# Patient Record
Sex: Female | Born: 1985 | Race: Black or African American | Hispanic: No | Marital: Single | Smoking: Current every day smoker
Health system: Southern US, Community
[De-identification: ages and names within clinical notes are randomized; demographics above are authoritative.]

## PROBLEM LIST (undated history)

## (undated) DIAGNOSIS — L732 Hidradenitis suppurativa: Secondary | ICD-10-CM

## (undated) HISTORY — DX: Hidradenitis suppurativa: L73.2

---

## 2006-10-07 ENCOUNTER — Emergency Department (HOSPITAL_COMMUNITY): Admission: EM | Admit: 2006-10-07 | Discharge: 2006-10-07 | Payer: Self-pay | Admitting: Emergency Medicine

## 2008-04-23 ENCOUNTER — Emergency Department (HOSPITAL_COMMUNITY): Admission: EM | Admit: 2008-04-23 | Discharge: 2008-04-23 | Payer: Self-pay | Admitting: Emergency Medicine

## 2008-10-22 ENCOUNTER — Inpatient Hospital Stay (HOSPITAL_COMMUNITY): Admission: AD | Admit: 2008-10-22 | Discharge: 2008-10-22 | Payer: Self-pay | Admitting: Obstetrics & Gynecology

## 2008-10-22 ENCOUNTER — Ambulatory Visit: Payer: Self-pay | Admitting: Obstetrics and Gynecology

## 2010-09-05 LAB — WET PREP, GENITAL
Clue Cells Wet Prep HPF POC: NONE SEEN
Yeast Wet Prep HPF POC: NONE SEEN

## 2010-09-05 LAB — CBC
HCT: 32.6 % — ABNORMAL LOW (ref 36.0–46.0)
MCHC: 32.5 g/dL (ref 30.0–36.0)
MCV: 78.2 fL (ref 78.0–100.0)
RBC: 4.17 MIL/uL (ref 3.87–5.11)

## 2010-09-05 LAB — HCG, QUANTITATIVE, PREGNANCY: hCG, Beta Chain, Quant, S: <2 m[IU]/mL (ref <5)

## 2010-09-05 LAB — GC/CHLAMYDIA PROBE AMP, GENITAL: Chlamydia, DNA Probe: NEGATIVE

## 2011-05-17 HISTORY — PX: ROOT CANAL: SHX2363

## 2011-05-21 ENCOUNTER — Encounter (HOSPITAL_COMMUNITY): Payer: Self-pay | Admitting: *Deleted

## 2011-05-21 ENCOUNTER — Emergency Department (HOSPITAL_COMMUNITY): Payer: 59

## 2011-05-21 ENCOUNTER — Encounter: Payer: Self-pay | Admitting: Emergency Medicine

## 2011-05-21 ENCOUNTER — Emergency Department (HOSPITAL_COMMUNITY)
Admission: EM | Admit: 2011-05-21 | Discharge: 2011-05-21 | Disposition: A | Discharge status: Home / Self Care | Payer: 59 | Attending: Emergency Medicine | Admitting: Emergency Medicine

## 2011-05-21 ENCOUNTER — Emergency Department (INDEPENDENT_AMBULATORY_CARE_PROVIDER_SITE_OTHER)
Admission: EM | Admit: 2011-05-21 | Discharge: 2011-05-21 | Disposition: A | Discharge status: Nursing Home (Includes ICF) | Payer: 59 | Source: Home / Self Care

## 2011-05-21 DIAGNOSIS — K044 Acute apical periodontitis of pulpal origin: Secondary | ICD-10-CM

## 2011-05-21 DIAGNOSIS — K047 Periapical abscess without sinus: Secondary | ICD-10-CM

## 2011-05-21 DIAGNOSIS — R509 Fever, unspecified: Secondary | ICD-10-CM | POA: Insufficient documentation

## 2011-05-21 DIAGNOSIS — K089 Disorder of teeth and supporting structures, unspecified: Secondary | ICD-10-CM | POA: Insufficient documentation

## 2011-05-21 DIAGNOSIS — Z79899 Other long term (current) drug therapy: Secondary | ICD-10-CM | POA: Insufficient documentation

## 2011-05-21 DIAGNOSIS — R220 Localized swelling, mass and lump, head: Secondary | ICD-10-CM | POA: Insufficient documentation

## 2011-05-21 DIAGNOSIS — R221 Localized swelling, mass and lump, neck: Secondary | ICD-10-CM | POA: Insufficient documentation

## 2011-05-21 DIAGNOSIS — K0889 Other specified disorders of teeth and supporting structures: Secondary | ICD-10-CM

## 2011-05-21 DIAGNOSIS — E119 Type 2 diabetes mellitus without complications: Secondary | ICD-10-CM | POA: Insufficient documentation

## 2011-05-21 LAB — CBC
HCT: 35 % — ABNORMAL LOW (ref 36.0–46.0)
MCHC: 32.6 g/dL (ref 30.0–36.0)
Platelets: 252 10*3/uL (ref 150–400)
RDW: 14.3 % (ref 11.5–15.5)
WBC: 9.9 10*3/uL (ref 4.0–10.5)

## 2011-05-21 LAB — DIFFERENTIAL
Basophils Absolute: 0 10*3/uL (ref 0.0–0.1)
Basophils Relative: 0 % (ref 0–1)
Lymphocytes Relative: 16 % (ref 12–46)
Monocytes Absolute: 0.5 10*3/uL (ref 0.1–1.0)
Neutro Abs: 7.8 10*3/uL — ABNORMAL HIGH (ref 1.7–7.7)
Neutrophils Relative %: 78 % — ABNORMAL HIGH (ref 43–77)

## 2011-05-21 LAB — BASIC METABOLIC PANEL
CO2: 24 mEq/L (ref 19–32)
Chloride: 102 mEq/L (ref 96–112)
GFR calc Af Amer: >90 mL/min (ref >90)
Potassium: 3.8 mEq/L (ref 3.5–5.1)
Sodium: 137 mEq/L (ref 135–145)

## 2011-05-21 MED ORDER — ACETAMINOPHEN 325 MG PO TABS
ORAL_TABLET | ORAL | Status: AC
  Filled 2011-05-21: qty 2

## 2011-05-21 MED ORDER — MORPHINE SULFATE 4 MG/ML IJ SOLN
4.0000 mg | Freq: Once | INTRAMUSCULAR | Status: AC
  Administered 2011-05-21: 4 mg via INTRAVENOUS
  Filled 2011-05-21: qty 1

## 2011-05-21 MED ORDER — SODIUM CHLORIDE 0.9 % IV SOLN
3.0000 g | Freq: Once | INTRAVENOUS | Status: AC
  Administered 2011-05-21: 3 g via INTRAVENOUS
  Filled 2011-05-21: qty 3

## 2011-05-21 MED ORDER — SODIUM CHLORIDE 0.9 % IV BOLUS (SEPSIS)
1000.0000 mL | Freq: Once | INTRAVENOUS | Status: AC
  Administered 2011-05-21: 1000 mL via INTRAVENOUS

## 2011-05-21 MED ORDER — IBUPROFEN 100 MG/5ML PO SUSP
ORAL | Status: AC
  Filled 2011-05-21: qty 30

## 2011-05-21 MED ORDER — ACETAMINOPHEN 325 MG PO TABS
650.0000 mg | ORAL_TABLET | Freq: Once | ORAL | Status: AC
  Administered 2011-05-21: 650 mg via ORAL

## 2011-05-21 MED ORDER — CLINDAMYCIN HCL 150 MG PO CAPS: 150.0000 mg | ORAL_CAPSULE | Freq: Four times a day (QID) | ORAL | Status: AC

## 2011-05-21 MED ORDER — ONDANSETRON HCL 4 MG/2ML IJ SOLN
4.0000 mg | Freq: Once | INTRAMUSCULAR | Status: AC
  Administered 2011-05-21: 4 mg via INTRAVENOUS
  Filled 2011-05-21: qty 2

## 2011-05-21 MED ORDER — CLINDAMYCIN HCL 150 MG PO CAPS
150.0000 mg | ORAL_CAPSULE | Freq: Four times a day (QID) | ORAL | Status: DC
Start: 2011-05-21 — End: 2011-05-21

## 2011-05-21 MED ORDER — IBUPROFEN 100 MG/5ML PO SUSP
600.0000 mg | Freq: Once | ORAL | Status: AC
  Administered 2011-05-21: 600 mg via ORAL

## 2011-05-21 NOTE — ED Notes (Signed)
To ed from [facility] for further eval of facial swelling and dental pain. [facility] attempted to call oral surgeon that was caring for pt but unable to get hold of so sent pt to ed. Pt is currently taking amoxicillin

## 2011-05-21 NOTE — ED Notes (Signed)
MEAL ORDERED FOR PT 

## 2011-05-21 NOTE — ED Notes (Signed)
Fever decreased. Pa aware

## 2011-05-21 NOTE — ED Provider Notes (Signed)
Medical screening examination/treatment/procedure(s) were conducted as a shared visit with non-physician practitioner(s) and myself.  I personally evaluated the patient during the encounter   Glynn Octave, MD 05/21/11 1725

## 2011-05-21 NOTE — ED Notes (Signed)
Pt here with left facial swelling radiating to upper lip and sharp pain s/p root canal @ [facility] last Thursday.pt states swelling started yesterday.pt is taking prescribed amoxicillin.temp on admit 100.2 and pt denies n/v.

## 2011-05-21 NOTE — ED Provider Notes (Signed)
History     CSN: [id number]  Arrival date & time 05/21/11  0841   None     Chief Complaint  Patient presents with  . Dental Pain    (Consider location/radiation/quality/duration/timing/severity/associated sxs/prior treatment) HPI Comments: Pt presents with c/o mouth pain, with facial pain and swelling. She had a root canal Lt lateral incisor 05-17-11. Two days later she began experiencing pain in her tooth and gum area. It then began radiating up into her face - up to her nose and Lt lower eyelid area, with facial swelling. She states she called the oral surgeons office twice over the wkend but did not leave a message. Yesterday she noticed the Amoxicillin she was taking before the root canal had a refill so she refilled it and began taking. She has had 4 doses without improvement. She is also taking Ibuprofen and Hydrocodone and states this is not relieving her pain. She has not noticed fever or chills.    Past Medical History  Diagnosis Date  . Diabetes mellitus     Past Surgical History  Procedure Date  . Root canal 05/17/2011    No family history on file.  History  Substance Use Topics  . Smoking status: Current Everyday Smoker  . Smokeless tobacco: Not on file  . Alcohol Use: No    OB History    Grav Para Term Preterm Abortions TAB SAB Ect Mult Living                  Review of Systems  Constitutional: Negative for fever, chills and fatigue.  HENT: Positive for facial swelling. Negative for ear pain, sore throat, rhinorrhea, sneezing, postnasal drip and sinus pressure.   Respiratory: Negative for cough, shortness of breath and wheezing.   Cardiovascular: Negative for chest pain and palpitations.    Allergies  Review of patient's allergies indicates no known allergies.  Home Medications   Current Outpatient Rx  Name Route Sig Dispense Refill  . AMOXICILLIN 500 MG PO CAPS Oral Take 500 mg by mouth 3 (three) times daily.     Marland Kitchen HYDROCODONE-ACETAMINOPHEN  5-325 MG PO TABS Oral Take 1 tablet by mouth every 6 (six) hours as needed. For pain    . METFORMIN HCL 500 MG PO TABS Oral Take 500 mg by mouth 2 (two) times daily with a meal.      . CLINDAMYCIN HCL 150 MG PO CAPS Oral Take 1 capsule (150 mg total) by mouth every 6 (six) hours. 28 capsule 0    BP 138/87  Pulse 109  Temp(Src) 100.2 F (37.9 C) (Oral)  Resp 18  SpO2 99%  LMP 05/18/2011  Physical Exam  Nursing note and vitals reviewed. Constitutional: She appears well-developed and well-nourished. No distress.  HENT:  Head: Normocephalic and atraumatic.    Right Ear: Tympanic membrane, external ear and ear canal normal.  Left Ear: Tympanic membrane, external ear and ear canal normal.  Nose: Nose normal.  Mouth/Throat: Uvula is midline, oropharynx is clear and moist and mucous membranes are normal. Normal dentition. No dental abscesses. No oropharyngeal exudate, posterior oropharyngeal edema or posterior oropharyngeal erythema.  Neck: Neck supple.  Cardiovascular: Normal rate, regular rhythm and normal heart sounds.   Pulmonary/Chest: Effort normal and breath sounds normal. No respiratory distress.  Lymphadenopathy:    She has no cervical adenopathy.  Neurological: She is alert.  Skin: Skin is warm and dry.  Psychiatric: She has a normal mood and affect.    ED Course  Procedures (  including critical care time)  Labs Reviewed - No data to display Dg Orthopantogram  05/21/2011  *RADIOLOGY REPORT*  Clinical Data: Left upper incisor pain  DG ORTHOPANTOGRAM/PANORAMIC  Comparison: None.  Findings: Negative for fracture of the mandible.  Multiple caries are present in the right lower molars.  Caries are also present in the  left lower molar and left upper molars.  Incisors are not well evaluated by   Panorex technique due to blurring.  There is a root canal on the left upper lateral incisor. Dental x-rays are suggested to evaluate for caries in this area.  IMPRESSION: Multiple dental  caries are present.  Dental radiographs   suggested to evaluate the incisors.  Original Report Authenticated By: Camelia Phenes, M.D.     1. Dental infection       MDM  Transfer to ED        Melody Comas, [place] 05/23/11 [phone]

## 2011-05-21 NOTE — ED Provider Notes (Signed)
History     CSN: [id number]  Arrival date & time 05/21/11  1030   First MD Initiated Contact with Patient 05/21/11 1101      Chief Complaint  Patient presents with  . Dental Pain    (Consider location/radiation/quality/duration/timing/severity/associated sxs/prior treatment) HPI Comments: Patient presents with worsening pain and swelling at the site of her root canal. This is her left upper incisor and had a root canal performed on Thursday 1220.  She is pain that is somewhat relieved by Vicodin. She states the swelling of her upper lip and left-sided her face and worsening. She denies any difficulty breathing or swallowing. She's had fever at home to 100.5. She denies any nausea, vomiting, chest pain or abdominal pain. She's taking amoxicillin which he just started yesterday. Her dentist is [facility] (Dr. Algis Downs).  The history is provided by the patient.    Past Medical History  Diagnosis Date  . Diabetes mellitus     Past Surgical History  Procedure Date  . Root canal 05/17/2011    History reviewed. No pertinent family history.  History  Substance Use Topics  . Smoking status: Current Everyday Smoker  . Smokeless tobacco: Not on file  . Alcohol Use: No    OB History    Grav Para Term Preterm Abortions TAB SAB Ect Mult Living                  Review of Systems  Constitutional: Positive for fever. Negative for activity change and appetite change.  HENT: Positive for dental problem. Negative for mouth sores and trouble swallowing.   Respiratory: Negative for cough, chest tightness and shortness of breath.   Cardiovascular: Negative for chest pain.  Gastrointestinal: Negative for nausea, vomiting and abdominal pain.  Genitourinary: Negative for dysuria and hematuria.  Musculoskeletal: Negative for back pain.  Neurological: Negative for headaches.    Allergies  Review of patient's allergies indicates no known allergies.  Home Medications   Current  Outpatient Rx  Name Route Sig Dispense Refill  . AMOXICILLIN 500 MG PO CAPS Oral Take 500 mg by mouth 3 (three) times daily.     Marland Kitchen HYDROCODONE-ACETAMINOPHEN 5-325 MG PO TABS Oral Take 1 tablet by mouth every 6 (six) hours as needed. For pain    . METFORMIN HCL 500 MG PO TABS Oral Take 500 mg by mouth 2 (two) times daily with a meal.      . CLINDAMYCIN HCL 150 MG PO CAPS Oral Take 1 capsule (150 mg total) by mouth every 6 (six) hours. 28 capsule 0    BP 173/82  Pulse 93  Temp(Src) 99.2 F (37.3 C) (Oral)  Resp 20  SpO2 97%  LMP 05/18/2011  Physical Exam  Constitutional: She is oriented to person, place, and time. No distress.  HENT:  Head: Normocephalic and atraumatic.  Mouth/Throat: Oropharynx is clear and moist. No oropharyngeal exudate.       Left upper incisor tender to palpation, no evidence of periapical abscess Mild swelling of upper lip and left cheek Floor of mouth soft, no trismus  Eyes: Conjunctivae are normal. Pupils are equal, round, and reactive to light.  Neck: Normal range of motion. Neck supple.       No meningismus  Cardiovascular: Normal rate, regular rhythm and normal heart sounds.   No murmur heard. Pulmonary/Chest: Effort normal and breath sounds normal. No respiratory distress.  Abdominal: Soft. There is no tenderness. There is no rebound and no guarding.  Musculoskeletal: Normal range of  motion.  Neurological: She is alert and oriented to person, place, and time. No cranial nerve deficit.  Skin: Skin is warm.    ED Course  Procedures (including critical care time)  Labs Reviewed  CBC - Abnormal; Notable for the following:    Hemoglobin 11.4 (*)    HCT 35.0 (*)    MCV 76.6 (*)    MCH 24.9 (*)    All other components within normal limits  DIFFERENTIAL - Abnormal; Notable for the following:    Neutrophils Relative 78 (*)    Neutro Abs 7.8 (*)    All other components within normal limits  BASIC METABOLIC PANEL - Abnormal; Notable for the following:     Glucose, Bld 135 (*)    All other components within normal limits  LAB REPORT - SCANNED   Dg Orthopantogram  05/21/2011  *RADIOLOGY REPORT*  Clinical Data: Left upper incisor pain  DG ORTHOPANTOGRAM/PANORAMIC  Comparison: None.  Findings: Negative for fracture of the mandible.  Multiple caries are present in the right lower molars.  Caries are also present in the  left lower molar and left upper molars.  Incisors are not well evaluated by   Panorex technique due to blurring.  There is a root canal on the left upper lateral incisor. Dental x-rays are suggested to evaluate for caries in this area.  IMPRESSION: Multiple dental caries are present.  Dental radiographs   suggested to evaluate the incisors.  Original Report Authenticated By: Camelia Phenes, M.D.     1. Pain, dental       MDM  Dental pain and swelling status post root canal. Patient sent from urgent care and told she needed IV antibiotics. She is in no distress here with mild tachycardia and fever. Will place IV, obtain labs, dose IV Unasyn, and discuss with oral surgeon.  Unable to reach Dr. Maurice Small at her office [phone].  No obvious abscess on exam.  Patient was delayed in starting her amoxicillin. Will dose IV antibiotics here, followup with dentist.   Glynn Octave, MD 05/22/11 1037

## 2011-05-21 NOTE — ED Notes (Signed)
PT STATES  I CALLED MY DENTIST OFFICE TODAY ABOUT SX AND WAS TOLD SOMEONE WOULD CALL ME IN 

## 2011-05-21 NOTE — ED Provider Notes (Signed)
History     CSN: [id number]  Arrival date & time 05/21/11  1030   First MD Initiated Contact with Patient 05/21/11 1101      Chief Complaint  Patient presents with  . Dental Pain    (Consider location/radiation/quality/duration/timing/severity/associated sxs/prior treatment) HPI  Past Medical History  Diagnosis Date  . Diabetes mellitus     Past Surgical History  Procedure Date  . Root canal 05/17/2011    History reviewed. No pertinent family history.  History  Substance Use Topics  . Smoking status: Current Everyday Smoker  . Smokeless tobacco: Not on file  . Alcohol Use: No    OB History    Grav Para Term Preterm Abortions TAB SAB Ect Mult Living                  Review of Systems  Allergies  Review of patient's allergies indicates no known allergies.  Home Medications   Current Outpatient Rx  Name Route Sig Dispense Refill  . AMOXICILLIN 500 MG PO CAPS Oral Take 500 mg by mouth 3 (three) times daily.     Marland Kitchen HYDROCODONE-ACETAMINOPHEN 5-325 MG PO TABS Oral Take 1 tablet by mouth every 6 (six) hours as needed. For pain    . METFORMIN HCL 500 MG PO TABS Oral Take 500 mg by mouth 2 (two) times daily with a meal.        BP 126/78  Pulse 110  Temp(Src) 101.3 F (38.5 C) (Oral)  Resp 20  SpO2 98%  LMP 05/18/2011  Physical Exam  ED Course  Procedures (including critical care time)  Labs Reviewed  CBC - Abnormal; Notable for the following:    Hemoglobin 11.4 (*)    HCT 35.0 (*)    MCV 76.6 (*)    MCH 24.9 (*)    All other components within normal limits  DIFFERENTIAL - Abnormal; Notable for the following:    Neutrophils Relative 78 (*)    Neutro Abs 7.8 (*)    All other components within normal limits  BASIC METABOLIC PANEL - Abnormal; Notable for the following:    Glucose, Bld 135 (*)    All other components within normal limits   No results found.   No diagnosis found.    MDM    Pt received in CDU per Dr. Manus Gunning. Report  obtained. Pt had root canal procedures performed both Thursday and Friday in office. 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 Has also been on cipro recently; and states that has been taking amoxicllin; had run out; and now back on them. Still taking vicodin for pain. Temp 100.5 at home. Experiencing increased edema and pain to surgical site. Was sent to ed per [facility].  On exam- pt alert, oriented x 3. Resting comfortably. In no distress. Had to awaken pt to perform exam. Managing secretions well. Mild edema to left upper incisor. No visible abscess noted. Does have left facial and lip edema. Noted to have partials x2 upper. Lungs clear. abd soft. Awaiting results from panorex and abx infusion. Will monitor.   Payton Mccallum, NP 05/21/11 1249  In to update pt and family. Pt resting comfortably. Reviewed all results with pt/family. Pt's temp was down; and now back up to 101.5.  Motrin ordered. Pt requesting snack/drink. Reviewed all findings with attending. He verified ok to give pt snack. Antibiotics iv in per orders. willl recheck temp and monitor closely.   Payton Mccallum, NP 05/21/11 1355  In to re-evaluate pt. Pt resting comfortably. No N/V.  Temp back down to 99.1.  Pt verified that still has vicodin at home.  Advised pt to stop taking amoxicillin; not to take any bactrim until further consulted by pcp or dentist. Pt to  Continue to take doxy as previously prescribed. Per attending's instructions: pt to be discharged with clinda script and to f/u with her dentist on Wednesday. Instructed to alternate with both tylenol and motrin for fever.   Payton Mccallum, NP 05/21/11 1501

## 2011-05-23 ENCOUNTER — Encounter (HOSPITAL_COMMUNITY): Payer: Self-pay | Admitting: Physician Assistant

## 2011-05-23 NOTE — ED Provider Notes (Signed)
Medical screening examination/treatment/procedure(s) were performed by non-physician practitioner and as supervising physician I was immediately available for consultation/collaboration.  Saahil Herbster   Jailen Lung, MD 05/23/11 1409 

## 2013-07-22 LAB — HM HEPATITIS C SCREENING LAB: HM Hepatitis Screen: NEGATIVE

## 2016-08-24 ENCOUNTER — Ambulatory Visit: Payer: 59 | Admitting: Registered"

## 2017-01-12 ENCOUNTER — Encounter: Payer: Self-pay | Admitting: Emergency Medicine

## 2017-01-12 ENCOUNTER — Emergency Department
Admission: EM | Admit: 2017-01-12 | Discharge: 2017-01-12 | Discharge status: Against Medical Advice | Payer: 59 | Attending: Emergency Medicine | Admitting: Emergency Medicine

## 2017-01-12 DIAGNOSIS — E119 Type 2 diabetes mellitus without complications: Secondary | ICD-10-CM | POA: Insufficient documentation

## 2017-01-12 DIAGNOSIS — Z5320 Procedure and treatment not carried out because of patient's decision for unspecified reasons: Secondary | ICD-10-CM | POA: Insufficient documentation

## 2017-01-12 DIAGNOSIS — K05219 Aggressive periodontitis, localized, unspecified severity: Secondary | ICD-10-CM | POA: Diagnosis not present

## 2017-01-12 DIAGNOSIS — R600 Localized edema: Secondary | ICD-10-CM | POA: Diagnosis present

## 2017-01-12 DIAGNOSIS — F17200 Nicotine dependence, unspecified, uncomplicated: Secondary | ICD-10-CM | POA: Insufficient documentation

## 2017-01-12 DIAGNOSIS — Z7984 Long term (current) use of oral hypoglycemic drugs: Secondary | ICD-10-CM | POA: Diagnosis not present

## 2017-01-12 MED ORDER — LIDOCAINE VISCOUS 2 % MT SOLN
15.0000 mL | Freq: Once | OROMUCOSAL | Status: AC
  Administered 2017-01-12: 15 mL via OROMUCOSAL
  Filled 2017-01-12: qty 15

## 2017-01-12 MED ORDER — DIPHENHYDRAMINE HCL 12.5 MG/5ML PO ELIX
25.0000 mg | ORAL_SOLUTION | Freq: Once | ORAL | Status: AC
  Administered 2017-01-12: 25 mg via ORAL
  Filled 2017-01-12: qty 10

## 2017-01-12 MED ORDER — DIAZEPAM 5 MG PO TABS
5.0000 mg | ORAL_TABLET | Freq: Once | ORAL | Status: AC
  Administered 2017-01-12: 5 mg via ORAL
  Filled 2017-01-12: qty 1

## 2017-01-12 MED ORDER — CLINDAMYCIN PHOSPHATE 600 MG/4ML IJ SOLN
600.0000 mg | Freq: Once | INTRAMUSCULAR | Status: AC
  Administered 2017-01-12: 600 mg via INTRAMUSCULAR
  Filled 2017-01-12: qty 4

## 2017-01-12 MED ORDER — LIDOCAINE HCL (PF) 1 % IJ SOLN
5.0000 mL | Freq: Once | INTRAMUSCULAR | Status: DC
  Filled 2017-01-12: qty 5

## 2017-01-12 MED ORDER — CLINDAMYCIN HCL 300 MG PO CAPS: 300.0000 mg | ORAL_CAPSULE | Freq: Four times a day (QID) | ORAL | 0 refills | Status: AC

## 2017-01-12 NOTE — ED Triage Notes (Signed)
Patient with complaint of abscess to the right side of the roof of her mouth. Patient states that she has had it times three days.

## 2017-01-12 NOTE — ED Provider Notes (Signed)
[facility] Emergency Department Provider Note  ____________________________________________  Time seen: Approximately 9:02 PM  I have reviewed the triage vital signs and the nursing notes.   HISTORY  Chief Complaint Abscess    HPI Haley [name] is a 31 y.o. female who presents to the emergency department withabscess to the roof of her mouth for 3 days. Patient states that she's had several abscesses previously. Patient went to the dentist yesterday and was given amoxicillin. Abscess continues to be painful. No fever, drainage, difficulty opening and closing mouth, swelling, tooth pain, shortness breath, chest pain.   Past Medical History:  Diagnosis Date  . Diabetes mellitus     There are no active problems to display for this patient.   Past Surgical History:  Procedure Laterality Date  . ROOT CANAL  05/17/2011    Prior to Admission medications   Medication Sig Start Date End Date Taking? Authorizing Provider  amoxicillin (AMOXIL) 500 MG capsule Take 500 mg by mouth 3 (three) times daily.  05/20/11   [provider]  clindamycin (CLEOCIN) 300 MG capsule Take 1 capsule (300 mg total) by mouth 4 (four) times daily. 01/12/17 01/22/17  Enid Derry, PA-C  HYDROcodone-acetaminophen (NORCO) 5-325 MG per tablet Take 1 tablet by mouth every 6 (six) hours as needed. For pain    [provider]  metFORMIN (GLUCOPHAGE) 500 MG tablet Take 500 mg by mouth 2 (two) times daily with a meal.      [provider]    Allergies Patient has no known allergies.  No family history on file.  Social History Social History  Substance Use Topics  . Smoking status: Current Some Day Smoker  . Smokeless tobacco: Never Used  . Alcohol use Yes     Comment: occ     Review of Systems  Constitutional: No fever/chills Cardiovascular: No chest pain. Respiratory: No SOB. Gastrointestinal: No abdominal pain.  No nausea, no vomiting.   Musculoskeletal: Negative for musculoskeletal pain. Skin: Negative for rash, abrasions, lacerations, ecchymosis.   ____________________________________________   PHYSICAL EXAM:  VITAL SIGNS: ED Triage Vitals  Enc Vitals Group     BP 01/12/17 1917 123/80     Pulse Rate 01/12/17 1917 84     Resp 01/12/17 1917 18     Temp 01/12/17 1917 98.8 F (37.1 C)     Temp Source 01/12/17 1917 Oral     SpO2 01/12/17 1917 95 %     Weight 01/12/17 1917 240 lb (108.9 kg)     Height 01/12/17 1917 5\' 5"  (1.651 m)     Head Circumference --      Peak Flow --      Pain Score 01/12/17 1916 9     Pain Loc --      Pain Edu? --      Excl. in GC? --      Constitutional: Alert and oriented. Well appearing and in no acute distress. Eyes: Conjunctivae are normal. PERRL. EOMI. Head: Atraumatic. ENT:      Ears:      Nose: No congestion/rhinnorhea.      Mouth/Throat: Mucous membranes are moist. 1 cm x 1 cm fluctuant mass behind upper right incisors. Tender to palpation. No visible swelling. No TMJ pain. No drainage.  Neck: No stridor.  Cardiovascular: Normal rate, regular rhythm.  Good peripheral circulation. Respiratory: Normal respiratory effort without tachypnea or retractions. Lungs CTAB. Good air entry to the bases with no decreased or absent breath sounds. Musculoskeletal: Full range  of motion to all extremities. No gross deformities appreciated. Neurologic:  Normal speech and language. No gross focal neurologic deficits are appreciated.  Skin:  Skin is warm, dry and intact. No rash noted.   ____________________________________________   LABS (all labs ordered are listed, but only abnormal results are displayed)  Labs Reviewed - No data to display ____________________________________________  EKG   ____________________________________________  RADIOLOGY  No results found.  ____________________________________________    PROCEDURES  Procedure(s) performed:     Procedures    Medications  diazepam (VALIUM) tablet 5 mg (5 mg Oral Given 01/12/17 2107)  diphenhydrAMINE (BENADRYL) 12.5 MG/5ML elixir 25 mg (25 mg Oral Given 01/12/17 2157)  lidocaine (XYLOCAINE) 2 % viscous mouth solution 15 mL (15 mLs Mouth/Throat Given 01/12/17 2157)  clindamycin (CLEOCIN) injection 600 mg (600 mg Intramuscular Given 01/12/17 2240)     ____________________________________________   INITIAL IMPRESSION / ASSESSMENT AND PLAN / ED COURSE  Pertinent labs & imaging results that were available during my care of the patient were reviewed by me and considered in my medical decision making (see chart for details).  Review of the [place] CSRS was performed in accordance of the [facility] prior to dispensing any controlled drugs.   Patient presented to the emergency department for evaluation of periodontal abscess. Abscess is fluctuant and I think needs to be drained. Patient was very anxious about the procedure so she was given Valium. She then decided to try antibiotics instead of I&D against medical advice. She is leaving AMA. Patient will be discharged home with prescriptions for clindamycin. Patient is to follow up with dentist as directed. Patient is given ED precautions to return to the ED for any worsening or new symptoms.     ____________________________________________  FINAL CLINICAL IMPRESSION(S) / ED DIAGNOSES  Final diagnoses:  Periodontal abscess      NEW MEDICATIONS STARTED DURING THIS VISIT:  Discharge Medication List as of 01/12/2017 10:38 PM    START taking these medications   Details  clindamycin (CLEOCIN) 300 MG capsule Take 1 capsule (300 mg total) by mouth 4 (four) times daily., Starting Sat 01/12/2017, Until Tue 01/22/2017, Print            This chart was dictated using voice recognition software/Dragon. Despite best efforts to proofread, errors can occur which can change the meaning. Any change was purely unintentional.    Enid Derry,  PA-C 01/13/17 2230    Arnaldo Natal, MD 01/15/17 2117

## 2017-01-12 NOTE — ED Notes (Signed)
Medication given to [place], [place].

## 2017-01-12 NOTE — Discharge Instructions (Addendum)
OPTIONS FOR DENTAL FOLLOW UP CARE ° °[place] Department of Health and Human Services - Local Safety Net Dental Clinics °http://www.ncdhhs.gov/dph/oralhealth/services/safetynetclinics.htm °  °[facility] (336-562-3123) ° °[facility] [place] (919-933-9087) ° °[facility] (919-663-1744 ext [phone]) ° °[facility] ([phone]) ° °[facility] ([phone]) °This clinic caters to the indigent population and is on a lottery system. °Location: °[facility], [address], [place] °Clinic Hours: °Wednesdays from 6pm - 9pm, patients seen by a lottery system. °For dates, call or go to [email or web address].[facility].edu/[facility]/patients/Dental-[facility] °Services: °Cleanings, fillings and simple extractions. °Payment Options: °DENTAL WORK IS FREE OF CHARGE. Bring proof of income or support. °Best way to get seen: °Arrive at 5:15 pm - this is a lottery, NOT first come/first serve, so arriving earlier will not increase your chances of being seen. °  °  °[facility] °919-537-3737 °Select option 1 for emergencies °  °Location: °[facility], [address], [place] °Clinic Hours: °No walk-ins accepted - call the day before to schedule an appointment. °Check in times are 9:30 am and 1:30 pm. °Services: °Simple extractions, temporary fillings, pulpectomy/pulp debridement, uncomplicated abscess drainage. °Payment Options: °PAYMENT IS DUE AT THE TIME OF SERVICE.  Fee is usually $100-200, additional surgical procedures (e.g. abscess drainage) may be extra. °Cash, checks, Visa/MasterCard accepted.  Can file Medicaid if patient is covered for dental - patient should call case worker to check. °No discount for [facility] patients. °Best way to get seen: °MUST call the day before and get onto the schedule. Can usually be seen the next 1-2 days. No walk-ins accepted. °  °  °[facility] °[phone] °   °Location: °[facility], [address], [place] °Clinic Hours: °M, W, Th, F 8am or 1:30pm, Tues 9a or 1:30 - first come/first served. °Services: °Simple extractions, temporary fillings, uncomplicated abscess drainage.  You do not need to be an [place] resident. °Payment Options: °PAYMENT IS DUE AT THE TIME OF SERVICE. °Dental insurance, otherwise sliding scale - bring proof of income or support. °Depending on income and treatment needed, cost is usually $50-200. °Best way to get seen: °Arrive early as it is first come/first served. °  °  °[facility] °[phone] °  °Location: °[address] °Clinic Hours: °Mon-Thu 8a-5p °Services: °Most basic dental services including extractions and fillings. °Payment Options: °PAYMENT IS DUE AT THE TIME OF SERVICE. °Sliding scale, up to 50% off - bring proof if income or support. °Medicaid with dental option accepted. °Best way to get seen: °Call to schedule an appointment, can usually be seen within 2 weeks OR they will try to see walk-ins - show up at 8a or 2p (you may have to wait). °  °  °[facility] °[phone] °[place] RESIDENTS ONLY °  °Location: °[facility], [address], [place], [place] [address] °Clinic Hours: By appointment only. °Monday - Thursday 8am-5pm, Friday 8am-12pm °Services: Cleanings, fillings, extractions. °Payment Options: °PAYMENT IS DUE AT THE TIME OF SERVICE. °Cash, Visa or MasterCard. Sliding scale - $30 minimum per service. °Best way to get seen: °Come in to office, complete packet and make an appointment - need proof of income °or support monies for each household member and proof of [place] residence. °Usually takes about a month to get in. °  °  °[facility] °[phone] °  °Location: °[address]   [place] °Clinic Hours: Walk-in Urgent Care Dental Services are offered Monday-Friday  mornings only. °The numbers of emergencies accepted daily is limited to the number of °providers available. °Maximum 15 - Mondays, Wednesdays & Thursdays °Maximum 10 - Tuesdays & Fridays °Services: °You do not need to be a [place] resident to be seen for a dental emergency. °Emergencies are defined as pain, swelling, abnormal bleeding, or dental trauma. Walkins will receive x-rays if needed. °NOTE: Dental cleaning is not an emergency. °Payment Options: °PAYMENT IS DUE AT THE TIME OF SERVICE. °Minimum co-pay is $40.00 for uninsured patients. °Minimum co-pay is $3.00 for Medicaid with dental coverage. °Dental Insurance is accepted and must be presented at time of visit. °Medicare does not cover dental. °Forms of payment: Cash, credit card, checks. °Best way to get seen: °If not previously registered with the clinic, walk-in dental registration begins at 7:15 am and is on a first come/first serve basis. °If previously registered with the clinic, call to make an appointment. °  °  °[facility] °919-776-4359 °[place] RESIDENTS ONLY °  °Location: °[address], [place], [place] °Clinic Hours: °Mon-Thu 10a-2p °Services: Extractions only! °Payment Options: °FREE (donations accepted) - bring proof of income or support °Best way to get seen: °Call and schedule an appointment OR come at 8am on the 1st Monday of every month (except for holidays) when it is first come/first served. °  °  °[facility] °[phone] °  °Location: °[address], [place] °Clinic Hours: °Friday mornings °Services, Payment Options, Best way to get seen: °Call for info °

## 2018-12-14 ENCOUNTER — Emergency Department
Admission: EM | Admit: 2018-12-14 | Discharge: 2018-12-15 | Disposition: A | Discharge status: Other Acute Inpatient Hospital | Payer: Medicaid - Out of State | Attending: Emergency Medicine | Admitting: Emergency Medicine

## 2018-12-14 ENCOUNTER — Other Ambulatory Visit: Payer: Self-pay

## 2018-12-14 ENCOUNTER — Encounter: Payer: Self-pay | Admitting: Emergency Medicine

## 2018-12-14 DIAGNOSIS — F17210 Nicotine dependence, cigarettes, uncomplicated: Secondary | ICD-10-CM | POA: Insufficient documentation

## 2018-12-14 DIAGNOSIS — E119 Type 2 diabetes mellitus without complications: Secondary | ICD-10-CM | POA: Insufficient documentation

## 2018-12-14 DIAGNOSIS — F3112 Bipolar disorder, current episode manic without psychotic features, moderate: Secondary | ICD-10-CM | POA: Diagnosis present

## 2018-12-14 DIAGNOSIS — Z20828 Contact with and (suspected) exposure to other viral communicable diseases: Secondary | ICD-10-CM | POA: Diagnosis not present

## 2018-12-14 DIAGNOSIS — R443 Hallucinations, unspecified: Secondary | ICD-10-CM | POA: Diagnosis not present

## 2018-12-14 DIAGNOSIS — R531 Weakness: Secondary | ICD-10-CM | POA: Diagnosis present

## 2018-12-14 DIAGNOSIS — F308 Other manic episodes: Secondary | ICD-10-CM | POA: Insufficient documentation

## 2018-12-14 LAB — URINALYSIS, COMPLETE (UACMP) WITH MICROSCOPIC
Bacteria, UA: NONE SEEN
Bilirubin Urine: NEGATIVE
Glucose, UA: >=500 mg/dL — AB
Ketones, ur: NEGATIVE mg/dL
Leukocytes,Ua: NEGATIVE
Nitrite: NEGATIVE
Protein, ur: NEGATIVE mg/dL
Specific Gravity, Urine: 1.007 (ref 1.005–1.030)
Squamous Epithelial / LPF: NONE SEEN (ref 0–5)
pH: 6 (ref 5.0–8.0)

## 2018-12-14 LAB — COMPREHENSIVE METABOLIC PANEL
ALT: 20 U/L (ref 0–44)
AST: 24 U/L (ref 15–41)
Albumin: 3.5 g/dL (ref 3.5–5.0)
Alkaline Phosphatase: 68 U/L (ref 38–126)
Anion gap: 9 (ref 5–15)
BUN: 6 mg/dL (ref 6–20)
CO2: 23 mmol/L (ref 22–32)
Calcium: 8.8 mg/dL — ABNORMAL LOW (ref 8.9–10.3)
Chloride: 104 mmol/L (ref 98–111)
Creatinine, Ser: 0.37 mg/dL — ABNORMAL LOW (ref 0.44–1.00)
GFR calc Af Amer: >60 mL/min (ref >60)
GFR calc non Af Amer: >60 mL/min (ref >60)
Glucose, Bld: 317 mg/dL — ABNORMAL HIGH (ref 70–99)
Potassium: 2.9 mmol/L — ABNORMAL LOW (ref 3.5–5.1)
Sodium: 136 mmol/L (ref 135–145)
Total Bilirubin: 0.7 mg/dL (ref 0.3–1.2)
Total Protein: 7.8 g/dL (ref 6.5–8.1)

## 2018-12-14 LAB — URINE DRUG SCREEN, QUALITATIVE (ARMC ONLY)
Amphetamines, Ur Screen: NOT DETECTED
Barbiturates, Ur Screen: NOT DETECTED
Benzodiazepine, Ur Scrn: NOT DETECTED
Cannabinoid 50 Ng, Ur ~~LOC~~: POSITIVE — AB
Cocaine Metabolite,Ur ~~LOC~~: NOT DETECTED
MDMA (Ecstasy)Ur Screen: NOT DETECTED
Methadone Scn, Ur: NOT DETECTED
Opiate, Ur Screen: NOT DETECTED
Phencyclidine (PCP) Ur S: NOT DETECTED
Tricyclic, Ur Screen: NOT DETECTED

## 2018-12-14 LAB — CBC
HCT: 35 % — ABNORMAL LOW (ref 36.0–46.0)
Hemoglobin: 11.5 g/dL — ABNORMAL LOW (ref 12.0–15.0)
MCH: 26.4 pg (ref 26.0–34.0)
MCHC: 32.9 g/dL (ref 30.0–36.0)
MCV: 80.3 fL (ref 80.0–100.0)
Platelets: 230 10*3/uL (ref 150–400)
RBC: 4.36 MIL/uL (ref 3.87–5.11)
RDW: 13.4 % (ref 11.5–15.5)
WBC: 10.7 10*3/uL — ABNORMAL HIGH (ref 4.0–10.5)
nRBC: 0 % (ref 0.0–0.2)

## 2018-12-14 LAB — TSH: TSH: 1.316 u[IU]/mL (ref 0.350–4.500)

## 2018-12-14 LAB — SARS CORONAVIRUS 2 BY RT PCR (HOSPITAL ORDER, PERFORMED IN ~~LOC~~ HOSPITAL LAB): SARS Coronavirus 2: NEGATIVE

## 2018-12-14 LAB — POCT PREGNANCY, URINE: Preg Test, Ur: NEGATIVE

## 2018-12-14 LAB — GLUCOSE, CAPILLARY: Glucose-Capillary: 260 mg/dL — ABNORMAL HIGH (ref 70–99)

## 2018-12-14 MED ORDER — CLINDAMYCIN HCL 150 MG PO CAPS
300.0000 mg | ORAL_CAPSULE | Freq: Once | ORAL | Status: AC
  Administered 2018-12-14: 300 mg via ORAL
  Filled 2018-12-14: qty 2

## 2018-12-14 NOTE — ED Notes (Signed)
[place] cart set up in room for telepsych call.

## 2018-12-14 NOTE — ED Notes (Signed)
Patient reports here because she ran out of her home medications, reports has not had her medications in 1 week.

## 2018-12-14 NOTE — ED Notes (Signed)
Report from jeanette, rn.  

## 2018-12-14 NOTE — ED Notes (Signed)
Pt provided with fluids/water at this time per pt's request.

## 2018-12-14 NOTE — ED Triage Notes (Addendum)
Pt to ED with c/o of weakness that has been ongoing approx 3-4 weeks. Pt states nausea but denies V/D. Pt also presents with multiple complaints and states needs meds refilled.

## 2018-12-14 NOTE — ED Notes (Signed)
Patient requesting different nurse to do her covid test. Patient wanted to swab herself. It was explained to patient that patients are not allowed to swab themselves here. Patient upset and requested a different nurse.

## 2018-12-14 NOTE — ED Provider Notes (Signed)
[facility] Emergency Department Provider Note  ___________________________________________   None    (approximate)  I have reviewed the triage vital signs and the nursing notes.   HISTORY  Chief Complaint Weakness  HPI Haley [name] is a 33 y.o. female who presents to the emergency department for treatment and evaluation of weakness, fatigue, hallucinations, low back pain, and abscess. 3 days ago, she traveled here from [place] after losing her job to stay with her mother who is very ill. Since being here, she has not been able to sleep. 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 She also has been smoking some marijuana that was "mixed together" to get a formula to help her "stay awake." Hallucinations started after smoking this concoction. She denies suicidal or homicidal ideations.   Past Medical History:  Diagnosis Date  . Diabetes mellitus     There are no active problems to display for this patient.   Past Surgical History:  Procedure Laterality Date  . ROOT CANAL  05/17/2011    Prior to Admission medications   Not on File    Allergies Patient has no known allergies.  No family history on file.  Social History Social History   Tobacco Use  . Smoking status: Current Some Day Smoker  . Smokeless tobacco: Never Used  Substance Use Topics  . Alcohol use: Yes    Comment: occ  . Drug use: No    Review of Systems  Constitutional: No fever/chills Eyes: No visual changes. ENT: No sore throat. Cardiovascular: Denies chest pain. Respiratory: Denies shortness of breath. Gastrointestinal: No abdominal pain.  No nausea, no vomiting.  No diarrhea.  No constipation. Genitourinary: Negative for dysuria. Musculoskeletal: Negative for back pain. Skin: Negative for rash. Neurological: Negative for headaches, focal weakness or numbness. Psychological: Positive for visual  hallucinations.  ____________________________________________   PHYSICAL EXAM:  VITAL SIGNS: ED Triage Vitals  Enc Vitals Group     BP 12/14/18 1436 (!) 153/84     Pulse Rate 12/14/18 1436 (!) 104     Resp 12/14/18 1436 18     Temp 12/14/18 1436 99.3 F (37.4 C)     Temp Source 12/14/18 1436 Oral     SpO2 12/14/18 1436 99 %     Weight 12/14/18 1437 240 lb (108.9 kg)     Height 12/14/18 1437 5\' 5"  (1.651 m)     Head Circumference --      Peak Flow --      Pain Score 12/14/18 1436 10     Pain Loc --      Pain Edu? --      Excl. in GC? --     Constitutional: Alert and oriented. Well appearing and in no acute distress. Eyes: Conjunctivae are normal. PERRL. EOMI. Head: Atraumatic. Nose: No congestion/rhinnorhea. Mouth/Throat: Mucous membranes are moist.  Oropharynx non-erythematous. Neck: No stridor.  Supple.  No meningeal signs. Cardiovascular: Normal rate, regular rhythm. Grossly normal heart sounds.  Good peripheral circulation. Respiratory: Normal respiratory effort.  No retractions. Lungs CTAB. Gastrointestinal: Soft and nontender. No distention. No abdominal bruits. No CVA tenderness. Musculoskeletal: No lower extremity tenderness nor edema.  No joint effusions. Neurologic:  Normal speech and language. No gross focal neurologic deficits are appreciated. No gait instability. Skin: Purulent drainage noted from open abscess in the right lower abdomen/groin skin fold.  No surrounding erythema is noted. Psychiatric: Flat affect. Flight of ideas.  ____________________________________________   LABS (all labs ordered are listed, but only abnormal results are displayed)  Labs Reviewed  CBC - Abnormal; Notable for the following components:      Result Value   WBC 10.7 (*)    Hemoglobin 11.5 (*)    HCT 35.0 (*)    All other components within normal limits  COMPREHENSIVE METABOLIC PANEL - Abnormal; Notable for the following components:   Potassium 2.9 (*)    Glucose, Bld  317 (*)    Creatinine, Ser 0.37 (*)    Calcium 8.8 (*)    All other components within normal limits  URINALYSIS, COMPLETE (UACMP) WITH MICROSCOPIC - Abnormal; Notable for the following components:   Color, Urine YELLOW (*)    APPearance CLEAR (*)    Glucose, UA >=500 (*)    Hgb urine dipstick MODERATE (*)    All other components within normal limits  URINE DRUG SCREEN, QUALITATIVE ([facility] ONLY) - Abnormal; Notable for the following components:   Cannabinoid 50 Ng, Ur [place] POSITIVE (*)    All other components within normal limits  GLUCOSE, CAPILLARY - Abnormal; Notable for the following components:   Glucose-Capillary 260 (*)    All other components within normal limits  SARS CORONAVIRUS 2 (HOSPITAL ORDER, [facility])  TSH  POC URINE PREG, ED  CBG MONITORING, ED  POCT PREGNANCY, URINE   ____________________________________________  EKG  Not indicated. ____________________________________________  RADIOLOGY  ED MD interpretation:  Not indicated.  Official radiology report(s): No results found.  ____________________________________________   PROCEDURES  Procedure(s) performed: None  Procedures  Critical Care performed: No  ____________________________________________   INITIAL IMPRESSION / ASSESSMENT AND PLAN / ED COURSE    33 year old female presenting to the emergency department for treatment and evaluation of multiple medical complaints.  Patient complaints are listed in the HPI.  Concerned that there might be some psychological component to her visit today.  She will be cleared medically and then definitely plan to consult TTS.  ----------------------------------------- 8:07 PM on 12/14/2018 -----------------------------------------  Urinalysis does show that she is positive for cannabinoids.  Awaiting TTS consult.  At this time, the patient is resting in the bed without complaint.  She has been up out of bed and asking random  questions of people.  She told 1 of the police officers that she needs to use the phone to call somebody to bring her bigger bra because her bra was too tight.      ----------------------------------------- 9:12 PM on 12/14/2018 -----------------------------------------  Patient care relinquished to Dr. Marjean Donna who will follow her to disposition. Continue to await TTS consult. ____________________________________________   FINAL CLINICAL IMPRESSION(S) / ED DIAGNOSES  Final diagnoses:  Hallucinations     ED Discharge Orders    None       Note:  This document was prepared using Dragon voice recognition software and may include unintentional dictation errors.    Victorino Dike, FNP 12/14/18 2115    Vanessa [place], MD 12/15/18 [phone]

## 2018-12-14 NOTE — ED Notes (Signed)
Blood glucose 260.

## 2018-12-14 NOTE — ED Notes (Signed)
Awaiting psych consult.

## 2018-12-14 NOTE — ED Notes (Signed)
Tele consult with TTS in process.

## 2018-12-14 NOTE — BH Assessment (Signed)
Assessment Note  Haley [name] is an 33 y.o. female. Who presents to the emergency department for treatment and evaluation of weakness, fatigue, hallucinations, low back pain, and abscess. 3 days ago, she traveled here from [place] after losing her job to stay with her mother who is very ill. Client presents stating that she had an episode on today. She reports a history of Mental Health Services of a child but states that during her adult life she has not been treated or seen for any mental health concerns. She denies any previous hospital admissions. She also denies any previous suicide attempt. She reported that she relocated to [place] several days ago to care for her mother who she recently discovered has cancer. She reports several psychosocial stressors including a recent job loss, financial restraints and complications with close family members. 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 Client suspected that the person that provided the marijuana may have given her something additional and she is unsure of what to substitute was. She reports hat since that time she's experienced paranoia. She has been unable to sleep at night. She continues to explain that she thought that her brother was attempting to kill her, although she admits now that she realizes that that was not reality and that she needs to no longer engage in illicit drug use. She also reports beginning to have auditory hallucinations. He States "I hear Whispers say take only what you need." Denies command hallucinations. Pt. denies any suicidal ideation, plan or intent. Pt. denies the presence of any visual hallucinations at this time.Patient is comfortable seeaking outpatient support he has contracted for safety.    Diagnosis: Substance induced Mood Disorder        Past Medical History:  Past Medical History:   Diagnosis Date  . Diabetes mellitus     Past Surgical History:  Procedure Laterality Date  . ROOT CANAL  05/17/2011    Family History: No family history on file.  Social History:  reports that she has been smoking. She has never used smokeless tobacco. She reports current alcohol use. She reports that she does not use drugs.  Additional Social History:  Alcohol / Drug Use Pain Medications: SEE PTA Prescriptions: SEE PTA Over the Counter: SEE PTA History of alcohol / drug use?: Yes Withdrawal Symptoms: Other (Comment) Substance #1 Name of Substance 1: THC 1 - Age of First Use: 33 1 - Amount (size/oz): varies 1 - Frequency: daily 1 - Duration: couple of months 1 - Last Use / Amount: Yesterday  CIWA: CIWA-Ar BP: (!) 127/56 Pulse Rate: (!) 59 COWS:    Allergies: No Known Allergies  Home Medications: (Not in a hospital admission)   OB/GYN Status:  No LMP recorded.  General Assessment Data Location of Assessment: [facility] TTS Assessment: In system Is this a Tele or Face-to-Face Assessment?: Tele Assessment Is this an Initial Assessment or a Re-assessment for this encounter?: Initial Assessment Patient Accompanied by:: N/A Living Arrangements: Other (Comment) What gender do you identify as?: Female Marital status: Single Living Arrangements: Parent Can pt return to current living arrangement?: Yes Admission Status: Voluntary Is patient capable of signing voluntary admission?: Yes Referral Source: Self/Family/Friend Insurance type: Medicaid   Medical Screening Exam ([place]) Medical Exam completed: Yes  Crisis Care Plan Living Arrangements: Parent Name of Psychiatrist: none  Name of Therapist: none  Education Status Is patient currently in school?: No Is the patient employed, unemployed or receiving disability?: Unemployed  Risk to self with the past 6 months Suicidal Ideation: No Has patient been a risk to self within the past 6 months prior to  admission? : No Suicidal Intent: No Has patient had any suicidal intent within the past 6 months prior to admission? : No Is patient at risk for suicide?: No Suicidal Plan?: No Has patient had any suicidal plan within the past 6 months prior to admission? : No Access to Means: No What has been your use of drugs/alcohol within the last 12 months?: THC  Previous Attempts/Gestures: No How many times?: 0 Other Self Harm Risks: Drug use  Triggers for Past Attempts: None known Intentional Self Injurious Behavior: None Family Suicide History: No Recent stressful life event(s): Loss (Comment), Job Loss, Financial Problems, Conflict (Comment) Persecutory voices/beliefs?: No Depression: Yes Depression Symptoms: Guilt, Tearfulness, Insomnia Substance abuse history and/or treatment for substance abuse?: Yes Suicide prevention information given to non-admitted patients: Not applicable  Risk to Others within the past 6 months Homicidal Ideation: No Does patient have any lifetime risk of violence toward others beyond the six months prior to admission? : No Thoughts of Harm to Others: No Current Homicidal Intent: No Current Homicidal Plan: No Access to Homicidal Means: No History of harm to others?: No Assessment of Violence: None Noted Violent Behavior Description: n Does patient have access to weapons?: No Criminal Charges Pending?: No Does patient have a court date: No Is patient on probation?: No  Psychosis Hallucinations: Visual Delusions: None noted  Mental Status Report Appearance/Hygiene: In hospital gown Eye Contact: Good Motor Activity: Freedom of movement Speech: Logical/coherent, Tangential Level of Consciousness: Alert Mood: Depressed Affect: Depressed Anxiety Level: Minimal Thought Processes: Coherent Judgement: Partial Orientation: Time, Situation, Place, Person Obsessive Compulsive Thoughts/Behaviors: Minimal  Cognitive Functioning Concentration: Poor Memory:  Recent Intact, Remote Intact Insight: Poor Impulse Control: Fair Appetite: Poor Have you had any weight changes? : Loss Amount of the weight change? (lbs): 100 lbs Sleep: Decreased(can stay up for days ) Total Hours of Sleep: 2 Vegetative Symptoms: None  ADLScreening [facility] Assessment Services) Patient's cognitive ability adequate to safely complete daily activities?: Yes Patient able to express need for assistance with ADLs?: Yes Independently performs ADLs?: Yes (appropriate for developmental age)  Prior Inpatient Therapy Prior Inpatient Therapy: No  Prior Outpatient Therapy Prior Outpatient Therapy: No Does patient have an ACCT team?: No Does patient have Intensive In-House Services?  : No Does patient have [facility] services? : No Does patient have P4CC services?: No  ADL Screening (condition at time of admission) Patient's cognitive ability adequate to safely complete daily activities?: Yes Patient able to express need for assistance with ADLs?: Yes Independently performs ADLs?: Yes (appropriate for developmental age)       Abuse/Neglect Assessment (Assessment to be complete while patient is alone) Abuse/Neglect Assessment Can Be Completed: Yes Physical Abuse: Denies Verbal Abuse: Denies Sexual Abuse: Denies Exploitation of patient/patient's resources: Denies Self-Neglect: Denies Values / Beliefs Cultural Requests During Hospitalization: None Spiritual Requests During Hospitalization: None Consults Spiritual Care Consult Needed: No Social Work Consult Needed: No            Disposition:  Disposition Initial Assessment Completed for this Encounter: Yes Patient [facility] (Comment[facility]  On Site Evaluation by:   Reviewed with Physician:    Asa SaunasShawanna N Denna Fryberger 12/14/2018 11:08 PM

## 2018-12-15 ENCOUNTER — Inpatient Hospital Stay
Admission: AD | Admit: 2018-12-15 | Discharge: 2018-12-19 | DRG: 885 | Disposition: A | Discharge status: Home / Self Care | Payer: No Typology Code available for payment source | Source: Intra-hospital | Attending: Psychiatry | Admitting: Psychiatry

## 2018-12-15 DIAGNOSIS — Z560 Unemployment, unspecified: Secondary | ICD-10-CM

## 2018-12-15 DIAGNOSIS — Z79899 Other long term (current) drug therapy: Secondary | ICD-10-CM | POA: Diagnosis not present

## 2018-12-15 DIAGNOSIS — Z5329 Procedure and treatment not carried out because of patient's decision for other reasons: Secondary | ICD-10-CM | POA: Diagnosis not present

## 2018-12-15 DIAGNOSIS — F3112 Bipolar disorder, current episode manic without psychotic features, moderate: Secondary | ICD-10-CM | POA: Diagnosis present

## 2018-12-15 DIAGNOSIS — F29 Unspecified psychosis not due to a substance or known physiological condition: Secondary | ICD-10-CM | POA: Diagnosis present

## 2018-12-15 DIAGNOSIS — R000 Tachycardia, unspecified: Secondary | ICD-10-CM | POA: Diagnosis present

## 2018-12-15 DIAGNOSIS — Z7984 Long term (current) use of oral hypoglycemic drugs: Secondary | ICD-10-CM | POA: Diagnosis not present

## 2018-12-15 DIAGNOSIS — F312 Bipolar disorder, current episode manic severe with psychotic features: Principal | ICD-10-CM | POA: Diagnosis present

## 2018-12-15 DIAGNOSIS — F1290 Cannabis use, unspecified, uncomplicated: Secondary | ICD-10-CM | POA: Diagnosis present

## 2018-12-15 DIAGNOSIS — Z87891 Personal history of nicotine dependence: Secondary | ICD-10-CM | POA: Diagnosis not present

## 2018-12-15 DIAGNOSIS — E1165 Type 2 diabetes mellitus with hyperglycemia: Secondary | ICD-10-CM | POA: Diagnosis present

## 2018-12-15 DIAGNOSIS — Z9114 Patient's other noncompliance with medication regimen: Secondary | ICD-10-CM | POA: Diagnosis not present

## 2018-12-15 DIAGNOSIS — L732 Hidradenitis suppurativa: Secondary | ICD-10-CM | POA: Diagnosis present

## 2018-12-15 DIAGNOSIS — G4700 Insomnia, unspecified: Secondary | ICD-10-CM | POA: Diagnosis present

## 2018-12-15 DIAGNOSIS — F308 Other manic episodes: Secondary | ICD-10-CM | POA: Diagnosis not present

## 2018-12-15 LAB — HEMOGLOBIN A1C
Hgb A1c MFr Bld: 10.3 % — ABNORMAL HIGH (ref 4.8–5.6)
Mean Plasma Glucose: 248.91 mg/dL

## 2018-12-15 LAB — GLUCOSE, CAPILLARY
Glucose-Capillary: 170 mg/dL — ABNORMAL HIGH (ref 70–99)
Glucose-Capillary: 198 mg/dL — ABNORMAL HIGH (ref 70–99)
Glucose-Capillary: 294 mg/dL — ABNORMAL HIGH (ref 70–99)
Glucose-Capillary: 199 mg/dL — ABNORMAL HIGH (ref 70–99)

## 2018-12-15 MED ORDER — DIVALPROEX SODIUM 500 MG PO DR TAB
500.0000 mg | DELAYED_RELEASE_TABLET | Freq: Two times a day (BID) | ORAL | Status: DC
  Administered 2018-12-16 – 2018-12-18 (×4): 500 mg via ORAL
  Filled 2018-12-15 (×6): qty 1

## 2018-12-15 MED ORDER — INSULIN ASPART 100 UNIT/ML ~~LOC~~ SOLN
0.0000 [IU] | Freq: Every day | SUBCUTANEOUS | Status: DC
  Administered 2018-12-16: 21:00:00 4 [IU] via SUBCUTANEOUS
  Administered 2018-12-17: 0 [IU] via SUBCUTANEOUS
  Filled 2018-12-15 (×2): qty 1

## 2018-12-15 MED ORDER — INSULIN ASPART 100 UNIT/ML ~~LOC~~ SOLN
0.0000 [IU] | Freq: Three times a day (TID) | SUBCUTANEOUS | Status: DC
  Administered 2018-12-16: 12:00:00 15 [IU] via SUBCUTANEOUS
  Administered 2018-12-17: 3 [IU] via SUBCUTANEOUS
  Administered 2018-12-17: 12:00:00 7 [IU] via SUBCUTANEOUS
  Administered 2018-12-17 – 2018-12-18 (×2): 3 [IU] via SUBCUTANEOUS
  Administered 2018-12-18 (×2): 4 [IU] via SUBCUTANEOUS
  Administered 2018-12-19: 10:00:00 7 [IU] via SUBCUTANEOUS
  Filled 2018-12-15 (×5): qty 1

## 2018-12-15 MED ORDER — DIVALPROEX SODIUM 500 MG PO DR TAB
500.0000 mg | DELAYED_RELEASE_TABLET | Freq: Two times a day (BID) | ORAL | Status: DC
  Filled 2018-12-15 (×2): qty 1

## 2018-12-15 MED ORDER — INSULIN ASPART 100 UNIT/ML ~~LOC~~ SOLN: 0.0000 [IU] | Freq: Every day | SUBCUTANEOUS | Status: DC

## 2018-12-15 MED ORDER — ALUM & MAG HYDROXIDE-SIMETH 200-200-20 MG/5ML PO SUSP: 30.0000 mL | ORAL | Status: DC | PRN

## 2018-12-15 MED ORDER — MAGNESIUM HYDROXIDE 400 MG/5ML PO SUSP: 30.0000 mL | Freq: Every day | ORAL | Status: DC | PRN

## 2018-12-15 MED ORDER — QUETIAPINE FUMARATE 25 MG PO TABS
50.0000 mg | ORAL_TABLET | Freq: Every day | ORAL | Status: DC
  Filled 2018-12-15: qty 2

## 2018-12-15 MED ORDER — QUETIAPINE FUMARATE 25 MG PO TABS: 50.0000 mg | ORAL_TABLET | Freq: Every day | ORAL | Status: DC

## 2018-12-15 MED ORDER — ACETAMINOPHEN 325 MG PO TABS
650.0000 mg | ORAL_TABLET | Freq: Four times a day (QID) | ORAL | Status: DC | PRN
  Filled 2018-12-15: qty 2

## 2018-12-15 MED ORDER — OLANZAPINE 5 MG PO TABS: 5.0000 mg | ORAL_TABLET | Freq: Two times a day (BID) | ORAL | Status: DC

## 2018-12-15 MED ORDER — INSULIN ASPART 100 UNIT/ML ~~LOC~~ SOLN
0.0000 [IU] | Freq: Three times a day (TID) | SUBCUTANEOUS | Status: DC
  Administered 2018-12-15 (×2): 4 [IU] via SUBCUTANEOUS
  Administered 2018-12-15: 11 [IU] via SUBCUTANEOUS
  Filled 2018-12-15: qty 1

## 2018-12-15 NOTE — ED Notes (Signed)
SOC in progress.  

## 2018-12-15 NOTE — ED Notes (Signed)
Hourly rounding reveals patient in room. Stable, in no acute distress. Q15 minute rounds and monitoring via Security Cameras to continue. 

## 2018-12-15 NOTE — ED Notes (Addendum)
Pt came to door demanding her cell phone. This tech explained to pt that she could not have her cell phone while back in the [facility]. Pt has patient rights paper in hand and stated "According to this I have a right to have my personal belongings." I explained to pt that she could not have her personal belongings in this area for her safety and ours as well and that is a rule in the ED. Pt threatening to call an attorney because "my rights are being violated." Pt also demanding to "speak to the doctor within one hour, the one who thinks I need to stay here."

## 2018-12-15 NOTE — ED Notes (Signed)
Report to include Situation, Background, Assessment, and Recommendations received from Amy B. RN. Patient alert and oriented, warm and dry, in no acute distress. Patient denies SI, HI, AVH and pain. Patient made aware of Q15 minute rounds and security cameras for their safety. Patient instructed to come to me with needs or concerns. Informed patient that I would let the NP know she wishes to go home instead of being admitted.

## 2018-12-15 NOTE — ED Notes (Signed)
Pt. Transferred to [place] from ED to room 2 after screening for contraband. Pt. Oriented to unit including Q15 minute rounds as well as the security cameras for their protection. Patient is alert and oriented, warm and dry in no acute distress. Patient denies SI, HI, but does state she has AH of voices "just talking" and Visions of her "family getting along". AVH. Pt. Encouraged to let me know if needs arise.

## 2018-12-15 NOTE — ED Notes (Signed)
Pt given meal tray and a diet drink.  

## 2018-12-15 NOTE — ED Notes (Signed)
RN informed EDP the patient would like to be discharged. EDP not comfortable discharging patient without another evaluation by a psychiatrist.

## 2018-12-15 NOTE — BH Assessment (Signed)
Writer called and left a HIPPA Compliant message with mother Mardene Celeste ELMRAJ-[phone]), requesting a return phone call.

## 2018-12-15 NOTE — BH Assessment (Signed)
Writer spoke with patient patient to get the contact information for someone who can provide collateral information. Per the patient, her mother was had an appointment this morning, "that's why she didn't answer her phone."  Writer called and left a HIPPA Compliant message with ex-boyfriend [facility] Burdick [phone]), requesting a return phone call.

## 2018-12-15 NOTE — ED Notes (Signed)
Hourly rounding reveals patient in rest room. Stable, in no acute distress. Q15 minute rounds and monitoring via [employer] to continue.

## 2018-12-15 NOTE — ED Notes (Signed)
Patient to the nurses station door in panties only asking for scrubs and deodorant.

## 2018-12-15 NOTE — BH Assessment (Signed)
Patient is to be admitted to [facility] by Dr. Dwyane Dee.  Attending Physician will be Dr. Dwyane Dee.   Patient has been assigned to room 304, by [facility] Charge Nurse Dementria.   Intake Paper Work has been signed and placed on patient chart.  ER staff is aware of the admission:  Glenda, ER Secretary    Dr. Kerman Passey, ER MD   Amy B., Patient's Nurse   Vonna Kotyk, Patient Access.

## 2018-12-15 NOTE — Progress Notes (Addendum)
Inpatient Diabetes Program Recommendations  AACE/ADA: New Consensus Statement on Inpatient Glycemic Control (2015)  Target Ranges:  Prepandial:   less than 140 mg/dL      Peak postprandial:   less than 180 mg/dL (1-2 hours)      Critically ill patients:  140 - 180 mg/dL   Lab Results  Component Value Date   GLUCAP 170 (H) 12/15/2018   HGBA1C 10.3 (H) 12/14/2018    Review of Glycemic Control Results for Haley [name], Haley [name] (MRN [id number]) as of 12/15/2018 10:25  Ref. Range 12/14/2018 15:58 12/15/2018 08:17  Glucose-Capillary Latest Ref Range: 70 - 99 mg/dL 260 (H) 170 (H)   Diabetes history: DM 2 Outpatient Diabetes medications:  None Current orders for Inpatient glycemic control:  Novolog resistant tid with meals and HS  Inpatient Diabetes Program Recommendations:   Note elevated A1C.  Patient likely needs medications started for DM when ready for transition home.  Please consider adding Metformin 500 mg bid at D/C.  Thanks  Adah Perl, RN, BC-ADM Inpatient Diabetes Coordinator Pager [phone] (8a-5p)

## 2018-12-15 NOTE — ED Notes (Signed)
Hourly rounding reveals patient sleeping in room. No complaints, stable, in no acute distress. Q15 minute rounds and monitoring via Security Cameras to continue. 

## 2018-12-15 NOTE — ED Notes (Signed)
VOL/Admit INPT BMU

## 2018-12-15 NOTE — ED Notes (Signed)
Pt dressed out by this RN and [employer], rn. The following items placed in one of one labeled bag: I phone, black wallet, pink purse, red t shirt, black shorts, black bra, black hair bonnet. Bag placed in Physiological scientist in [employer].

## 2018-12-15 NOTE — ED Notes (Signed)
Patients CBG of 199 does not require insulin although she refused any medications.

## 2018-12-15 NOTE — ED Provider Notes (Signed)
-----------------------------------------   6:12 AM on 12/15/2018 -----------------------------------------   Blood pressure 130/63, pulse 70, temperature 97.6 F (36.4 C), temperature source Oral, resp. rate 18, height 1.651 m (5\' 5" ), weight 108.9 kg, SpO2 100 %.  The patient is calm and cooperative at this time.  There have been no acute events since the last update.  Awaiting admission.   Hinda Kehr, MD 12/15/18 [phone]

## 2018-12-15 NOTE — ED Notes (Signed)
While obtaining pt VS, this tech overheard pt on phone talking to someone and pt stated to person "I only told them that I was hearing voices so they would do the COVID test and show me how to manage my diabetes. I want to make my mom happy so I said I was hearing voices so I could be tested. Now that I have been tested and it is negative someone can tell my mom and I can get out of here and go find me a job. These nice ladies here have been showing me how to check my sugar and how many units of insulin I need to give myself. When my sugar is high I give myself more units." Pt then asked this tech is she "could speak to the doctor who thinks I need to stay here?"

## 2018-12-15 NOTE — ED Provider Notes (Signed)
Patient is requesting to leave.  Discussed with the psychiatric team Kennyth Lose, NP who recommend that we IVC patient given her psychosis.   Vanessa Iroquois, MD 12/15/18 2329

## 2018-12-15 NOTE — ED Provider Notes (Signed)
_________________________ 2:22 AM on 12/15/2018 -----------------------------------------  Patient evaluated by psych and recommended voluntary inpatient admission   Rudene Re, MD 12/15/18 0222

## 2018-12-15 NOTE — BH Assessment (Addendum)
Writer received phone call from patient's friend Sim Boast K.)  Per his report, the patient came to [place] this past Tuesday (12/09/2018) to help care for her mother, who is terminally ill due to cancer. She's been staying with this friend due to her mental state. The patient hasn't slept in two days, playing games on her phone and gambling (spending excess amounts of money). When she's at the mother house, she's loud and disturbing the mother. The friend further reports, she's walking and talking fast and it's difficult to understand what she's talking about, as far as the content of what she's saying. "It doesn't make sense but in her head I guess she understand but she's the only one." The patient's brother recently arrived from out of town to help care for the mother. When the brother took the mother to an appointment, the patient shared with the boyfriend she believe the brother was trying to kill her (Patient). The boyfriend took the patient's keys and her phone, out of fear of what she would do.  Writer updated Psych Nurse Practitioner of the conversation.

## 2018-12-16 ENCOUNTER — Other Ambulatory Visit: Payer: Self-pay

## 2018-12-16 ENCOUNTER — Inpatient Hospital Stay: Payer: No Typology Code available for payment source

## 2018-12-16 DIAGNOSIS — F3112 Bipolar disorder, current episode manic without psychotic features, moderate: Secondary | ICD-10-CM

## 2018-12-16 DIAGNOSIS — F29 Unspecified psychosis not due to a substance or known physiological condition: Secondary | ICD-10-CM | POA: Diagnosis present

## 2018-12-16 HISTORY — DX: Unspecified psychosis not due to a substance or known physiological condition: F29

## 2018-12-16 LAB — GLUCOSE, CAPILLARY
Glucose-Capillary: 213 mg/dL — ABNORMAL HIGH (ref 70–99)
Glucose-Capillary: 309 mg/dL — ABNORMAL HIGH (ref 70–99)
Glucose-Capillary: 345 mg/dL — ABNORMAL HIGH (ref 70–99)

## 2018-12-16 LAB — LIPID PANEL
Cholesterol: 107 mg/dL (ref 0–200)
HDL: 32 mg/dL — ABNORMAL LOW (ref >40)
LDL Cholesterol: 63 mg/dL (ref 0–99)
Total CHOL/HDL Ratio: 3.3 RATIO
Triglycerides: 62 mg/dL (ref <150)
VLDL: 12 mg/dL (ref 0–40)

## 2018-12-16 MED ORDER — OLANZAPINE 5 MG PO TBDP
5.0000 mg | ORAL_TABLET | Freq: Two times a day (BID) | ORAL | Status: DC | PRN
  Administered 2018-12-16: 5 mg via ORAL
  Filled 2018-12-16: qty 1

## 2018-12-16 MED ORDER — CLINDAMYCIN HCL 150 MG PO CAPS
150.0000 mg | ORAL_CAPSULE | Freq: Four times a day (QID) | ORAL | Status: DC
  Administered 2018-12-16 – 2018-12-17 (×3): 150 mg via ORAL
  Filled 2018-12-16 (×5): qty 1

## 2018-12-16 MED ORDER — ENSURE MAX PROTEIN PO LIQD
11.0000 [oz_av] | Freq: Two times a day (BID) | ORAL | Status: DC
  Administered 2018-12-17 – 2018-12-18 (×3): 11 [oz_av] via ORAL
  Filled 2018-12-16: qty 330

## 2018-12-16 MED ORDER — ADULT MULTIVITAMIN W/MINERALS CH
1.0000 | ORAL_TABLET | Freq: Every day | ORAL | Status: DC
  Administered 2018-12-17: 09:00:00 1 via ORAL
  Filled 2018-12-16 (×2): qty 1

## 2018-12-16 MED ORDER — WHITE PETROLATUM EX OINT
TOPICAL_OINTMENT | CUTANEOUS | Status: AC
  Administered 2018-12-16: 1
  Filled 2018-12-16: qty 5

## 2018-12-16 MED ORDER — QUETIAPINE FUMARATE 100 MG PO TABS
100.0000 mg | ORAL_TABLET | Freq: Every day | ORAL | Status: DC
  Administered 2018-12-16: 21:00:00 100 mg via ORAL
  Filled 2018-12-16: qty 1

## 2018-12-16 NOTE — Progress Notes (Addendum)
Inpatient Diabetes Program Recommendations  AACE/ADA: New Consensus Statement on Inpatient Glycemic Control (2015)  Target Ranges:  Prepandial:   less than 140 mg/dL      Peak postprandial:   less than 180 mg/dL (1-2 hours)      Critically ill patients:  140 - 180 mg/dL   Lab Results  Component Value Date   GLUCAP 213 (H) 12/16/2018   HGBA1C 10.3 (H) 12/14/2018    Review of Glycemic Control Results for Haley, [name] (MRN [id number]) as of 12/16/2018 11:11  Ref. Range 12/15/2018 11:45 12/15/2018 16:53 12/15/2018 21:31 12/16/2018 07:05  Glucose-Capillary Latest Ref Range: 70 - 99 mg/dL 198 (H) 294 (H) 199 (H) 213 (H)  Diabetes history: DM 2 Outpatient Diabetes medications:  None Current orders for Inpatient glycemic control:  Novolog resistant tid with meals and HS  Inpatient Diabetes Program Recommendations:    While in the hospital, consider adding Lantus 10 units daily.  Note elevated A1C.  Patient likely needs medications started for DM when ready for transition home.  Please consider adding Metformin 500 mg bid at D/C.  Thanks,  Adah Perl, RN, BC-ADM Inpatient Diabetes Coordinator Pager [phone] (8a-5p)

## 2018-12-16 NOTE — BHH Counselor (Signed)
Adult Comprehensive Assessment  Patient ID: Haley [name], female   DOB: [birth year], 33 y.o.   MRN: [id number]  Information Source: Information source: Patient  Current Stressors:  Patient states their primary concerns and needs for treatment are:: Patient reports "I am diabetic. I ran out of Trulicity and my mom wants me to get my health under control to take control for her." Patient states their goals for this hospitilization and ongoing recovery are:: Pt reports "quit smoking Black & Mild's, get the weed out of my system and get my insulin.  Manage my diabetes beacuse I have no money and no insurance." Educational / Learning stressors: Pt reports "I want to go to law school." Employment / Job issues: Pt reports "I was fired from my job April 7th for something I did not do." Family Relationships: Pt's mother has cancer.  Pt reports that she recently met her biological family in March.  Patient reports several siblings have mental illness. Financial / Lack of resources (include bankruptcy): Pt reports "got too many bills". Housing / Lack of housing: Pt reports "yeah, my rent is due by the 24th and I have no money.   I have been trying to get a house." Physical health (include injuries & life threatening diseases): Pt reports "I have Hidradenitis Suppurativa." Social relationships: Pt reports "I put all my information on facebook."  Pt reports a belief that her roommate is slicing her tires or putting holes in them. Substance abuse: Pt reports a history of smoking. Bereavement / Loss: Pt reports that grandmother passed in 2007 from cancer.  Living/Environment/Situation:  Living Arrangements: Other relatives, Non-relatives/Friends Who else lives in the home?: Pt reports that she has been living with her mother and brother in [place], however, was living with her roommate in [place]. How long has patient lived in current situation?: Pt reports that she has lived in her apartment with her roommate since  May 2019. What is atmosphere in current home: Temporary  Family History:  Marital status: Single Are you sexually active?: Yes What is your sexual orientation?: Pt reports "people who are smart". Pt reports that she recently has been having urges to be in a relatioship with a woman. Has your sexual activity been affected by drugs, alcohol, medication, or emotional stress?: Pt reports "yes". Does patient have children?: No  Childhood History:  By whom was/is the patient raised?: Adoptive parents Additional childhood history information: Pt reports that she was adopted at "2 or 3 but been with my mother since 53 months". Description of patient's relationship with caregiver when they were a child: Pt reports that "it was great". Patient's description of current relationship with people who raised him/her: Pt reports "it's a little strained because I stopped playing Bingo". How were you disciplined when you got in trouble as a child/adolescent?: Pt reports "whoopings". Does patient have siblings?: Yes Number of Siblings: 12 Description of patient's current relationship with siblings: Pt reports "off and on". Did patient suffer any verbal/emotional/physical/sexual abuse as a child?: No Did patient suffer from severe childhood neglect?: No Has patient ever been sexually abused/assaulted/raped as an adolescent or adult?: No Was the patient ever a victim of a crime or a disaster?: No Witnessed domestic violence?: No Has patient been effected by domestic violence as an adult?: No  Education:  Highest grade of school patient has completed: 12th Currently a student?: No Learning disability?: No  Employment/Work Situation:   Employment situation: Unemployed Did You Receive Any Psychiatric Treatment/Services While in [employer]?: (  NA) Are There Guns or Other Weapons in [place]?: No  Financial Resources:   Financial resources: No income Does patient have a representative payee or  guardian?: No  Alcohol/Substance Abuse:   What has been your use of drugs/alcohol within the last 12 months?: Pt reports "marijuana once or twice a year, just 2 pulls". If attempted suicide, did drugs/alcohol play a role in this?: No Alcohol/Substance Abuse Treatment Hx: Denies past history Has alcohol/substance abuse ever caused legal problems?: No  Social Support System:   Patient's Community Support System: Good Describe Community Support System: Pt reports a best friend, mother, brother and cousin" Type of faith/religion: Pt reports "Christian" How does patient's faith help to cope with current illness?: Pt reports "praying".  Leisure/Recreation:   Leisure and Hobbies: Pt reports "singing, Bingo, Hospital doctor, Insurance claims handler, make clothes"  Strengths/Needs:   What is the patient's perception of their strengths?: Pt rpeorts "I don't lie.  I have a great amounto f integrity. I offer advice." Patient states these barriers may affect their return to the community: Pt reports "I can't afforc my rent."  Discharge Plan:   Currently receiving community mental health services: No Patient states concerns and preferences for aftercare planning are: CSW will assess further. Patient states they will know when they are safe and ready for discharge when: Pt reports "I am clear headed". Does patient have access to transportation?: No Does patient have financial barriers related to discharge medications?: No Plan for no access to transportation at discharge: Pt reports that she will need transportation. Will patient be returning to same living situation after discharge?: Yes  Summary/Recommendations:   Summary and Recommendations (to be completed by the evaluator): Patient is a 33 year old single female from [place], [place] ([place]).   She reports that she is unemployed as well as uninsured.  She presents to the hospital following lack of sleep, fatigue, weakness and hallucinations.  She  has a primary diagnosis of Bipolar Affective Disorder, currently manic, moderate.  Recommendations include: crisis stabilization, therapeutic milieu, encourage group attendance and participation, medication management for mood stabilization and development of comprehensive mental wellness plan.  Rozann Lesches. 12/16/2018

## 2018-12-16 NOTE — Progress Notes (Signed)
Admission Note:  33r female who presents IVC in acute distress for the treatment of Substance Abuse and Pt appears anxious, angry and upset. Patient was not calm  during  She was demanding, dominating, defensive and interrupting Probation officer, challenging her throughout the interview and as such the interview was ended abruptly when she stated threatening staff. Per records patient just moved from [place]  To [place] to take care of her mother who was recently diagnosed with cancer, however there seems to be some family altercations since she been tin the house and she had to move out. Patient stated while staying with a friend she stated experimenting with cannabidiols and it stated make her hallucinate , paranoid and not slept in 3 days. patient stated " I recently lost my job in [place], I couldn't get unemployment and even though I appealed I was denied,  and I have been gambling losing money and burning through my savings "   Patient denies SI/HI/AVH upon admission, she has Past medical Hx of  DM without complication, her CBG was 200 upon admission. Patient's skin was assessed and she was noted to have dry skin, warm to touch, she has old boil under her arms and also an abscess was noted in her RLQ, she stated she was taking Clindamycin for it but it was noted discontinued. Patient is not receptiove to nursing staff, unit policies explained and understanding verbalized, consents obtained. Food and fluids offered, and fluids accepted. 15 minutes safety checks maintained will continue to monitor.

## 2018-12-16 NOTE — Progress Notes (Signed)
Initial Nutrition Assessment  DOCUMENTATION CODES:   Not applicable  INTERVENTION:   Ensure Max protein supplement BID, each supplement provides 150kcal and 30g of protein.  MVI daily   Carbohydrate controlled diet   NUTRITION DIAGNOSIS:   Predicted suboptimal nutrient intake related to social / environmental circumstances as evidenced by other (comment)(per chart review).  GOAL:   Patient will meet greater than or equal to 90% of their needs  MONITOR:   Supplement acceptance, PO intake  REASON FOR ASSESSMENT:   Malnutrition Screening Tool    ASSESSMENT:   33 y/o female with h/o DM who presents IVC in acute distress for the treatment of Substance Abuse   RD working remotely.  Per chart review, suspect pt with poor appetite and oral intake r/t substance abuse and anxiety. Pt reports recent weight loss but there are no recent weights documented in chart to confirm this. Pt does appear to have lost ~60lbs since 2018; RD unsure if weight loss was intentional or not. RD will add supplements and MVI to help pt meet her estimated needs. Pt would likely benefit from outpatient diabetes education; would recommend referral to [facility] education center [phone] after discharge.   Medications reviewed and include: insulin  Labs reviewed: cbgs- 170, 198, 294, 199, 213 x 24 hrs AIC 10.3(H)- 7/19   Diet Order:   Diet Order            Diet Carb Modified Fluid consistency: Thin; Room service appropriate? Yes  Diet effective now             EDUCATION NEEDS:   No education needs have been identified at this time  Skin:  Skin Assessment: Reviewed RN Assessment(boil under arm, RLQ abscess)  Last BM:  unknown  Height:   Ht Readings from Last 1 Encounters:  12/15/18 5\' 7"  (1.702 m)    Weight:   Wt Readings from Last 1 Encounters:  12/15/18 81.6 kg    Ideal Body Weight:  61.36 kg  BMI:  Body mass index is 28.19 kg/m.  Estimated Nutritional Needs:   Kcal:   1800-2100kcal/day  Protein:  90-105g/day  Fluid:  >1.8L/day  Koleen Distance MS, RD, LDN Pager #- [phone] Office#- [phone] After Hours Pager: [phone]

## 2018-12-16 NOTE — Progress Notes (Signed)
Recreation Therapy Notes  Date: 12/16/2018  Time: 9:30 am   Location: Craft room   Behavioral response: N/A   Intervention Topic: Anger Management   Discussion/Intervention: Patient did not attend group.   Clinical Observations/Feedback:  Patient did not attend group.   Mouhamed Glassco LRT/CTRS        Luda Charbonneau 12/16/2018 10:36 AM

## 2018-12-16 NOTE — H&P (Signed)
Psychiatric Admission Assessment Adult  Patient Identification: Haley HaiDenea [name] MRN:  [id number] Date of Evaluation:  12/16/2018 Chief Complaint:  bipolar Principal Diagnosis: <principal problem not specified> Diagnosis:  Active Problems:   Bipolar affective disorder, currently manic, moderate (HCC)  History of Present Illness: Patient is a 33 year old female transferred from [facility] ED for stabilization treatment of manic behaviors along with hallucinations.  Patient reports that she has not been sleeping, playing a game on Facebook, states that she makes money off it, seems to not be able to let go of the game, is agitated, feels people are trying to control her.  As per mom and brother, patient was doing fairly well until about 6 months ago, lost her job, has been playing this video games on Facebook, is agitated easily, spending money excessively, drove from [place] to [place] to be with mom.  Mom reports that patient does not make any sense, goes from one topic to another, cannot seem to slow down and also at times reports that she is hearing whispers, mom also reports that she is become hyper religious.  Mom states that patient was doing well, she is not sure what caused her daughter to have the symptoms and is concerned for her safety and wellbeing.  Mom adds that she wants her daughter to have treatment and get better as she was doing well till about 6 months ago but seems to be much more worse over the last few weeks.  Mom states that patient is never threatened to hurt herself or others, does appear to be paranoid at times.  She reports that her daughter does not use illicit substances or use tobacco.  The urine drug screen was however positive for marijuana and patient did report that she uses marijuana, also felt at one time that her brother was trying to kill her.  She denies her being on any psychotropic medication, history of any psychiatric inpatient admission, history of  physical or sexual abuse.  She also denies any family psychiatric history Associated Signs/Symptoms: Depression Symptoms:  psychomotor agitation, difficulty concentrating, (Hypo) Manic Symptoms:  Delusions, Distractibility, Flight of Ideas, Licensed conveyancerinancial Extravagance, Grandiosity, Impulsivity, Labiality of Mood, Anxiety Symptoms:  Excessive Worry, Psychotic Symptoms:  Delusions, PTSD Symptoms: Negative Total Time spent with patient: 1 hour  Past Psychiatric History:   Is the patient at risk to self? No.  Has the patient been a risk to self in the past 6 months? No.  Has the patient been a risk to self within the distant past? No.  Is the patient a risk to others? No.  Has the patient been a risk to others in the past 6 months? No.  Has the patient been a risk to others within the distant past? No.   Prior Inpatient Therapy:   Prior Outpatient Therapy:    Alcohol Screening: 1. How often do you have a drink containing alcohol?: Monthly or less 2. How many drinks containing alcohol do you have on a typical day when you are drinking?: 1 or 2 3. How often do you have six or more drinks on one occasion?: Never AUDIT-C Score: 1 4. How often during the last year have you found that you were not able to stop drinking once you had started?: Never 5. How often during the last year have you failed to do what was normally expected from you becasue of drinking?: Never 6. How often during the last year have you needed a first drink in the morning to get  yourself going after a heavy drinking session?: Never 7. How often during the last year have you had a feeling of guilt of remorse after drinking?: Never 8. How often during the last year have you been unable to remember what happened the night before because you had been drinking?: Never 9. Have you or someone else been injured as a result of your drinking?: No 10. Has a relative or friend or a doctor or another health worker been concerned about  your drinking or suggested you cut down?: No Alcohol Use Disorder Identification Test Final Score (AUDIT): 1 Alcohol Brief Interventions/Follow-up: AUDIT Score <7 follow-up not indicated Substance Abuse History in the last 12 months:  No. Consequences of Substance Abuse: Negative Previous Psychotropic Medications: No  Psychological Evaluations: No  Past Medical History:  Past Medical History:  Diagnosis Date  . Diabetes mellitus     Past Surgical History:  Procedure Laterality Date  . ROOT CANAL  05/17/2011   Family History: History reviewed. No pertinent family history. Family Psychiatric  History: None per mom Tobacco Screening: Have you used any form of tobacco in the last 30 days? (Cigarettes, Smokeless Tobacco, Cigars, and/or Pipes): No Social History:  Social History   Substance and Sexual Activity  Alcohol Use Yes   Comment: occ     Social History   Substance and Sexual Activity  Drug Use No    Additional Social History:      History of alcohol / drug use?: No history of alcohol / drug abuse                    Allergies:  No Known Allergies Lab Results:  Results for orders placed or performed during the hospital encounter of 12/15/18 (from the past 48 hour(s))  Glucose, capillary     Status: Abnormal   Collection Time: 12/16/18  7:05 AM  Result Value Ref Range   Glucose-Capillary 213 (H) 70 - 99 mg/dL    Blood Alcohol level:  No results found for: [facility]  Metabolic Disorder Labs:  Lab Results  Component Value Date   HGBA1C 10.3 (H) 12/14/2018   MPG 248.91 12/14/2018   No results found for: PROLACTIN No results found for: CHOL, TRIG, HDL, CHOLHDL, VLDL, LDLCALC  Current Medications: Current Facility-Administered Medications  Medication Dose Route Frequency Provider Last Rate Last Dose  . acetaminophen (TYLENOL) tablet 650 mg  650 mg Oral Q6H PRN Charm RingsLord, Jamison Y, NP      . alum & mag hydroxide-simeth (MAALOX/MYLANTA) 200-200-20 MG/5ML  suspension 30 mL  30 mL Oral Q4H PRN Charm RingsLord, Jamison Y, NP      . divalproex (DEPAKOTE) DR tablet 500 mg  500 mg Oral Q12H Lord, Jamison Y, NP      . insulin aspart (novoLOG) injection 0-20 Units  0-20 Units Subcutaneous TID WC Lord, Herminio HeadsJamison Y, NP      . insulin aspart (novoLOG) injection 0-5 Units  0-5 Units Subcutaneous QHS Charm RingsLord, Jamison Y, NP      . magnesium hydroxide (MILK OF MAGNESIA) suspension 30 mL  30 mL Oral Daily PRN Charm RingsLord, Jamison Y, NP      . Melene Muller[START ON 12/17/2018] multivitamin with minerals tablet 1 tablet  1 tablet Oral Daily Nelly RoutKumar, Chabely Norby, MD      . protein supplement (ENSURE MAX) liquid  11 oz Oral BID Nelly RoutKumar, Tabria Steines, MD      . QUEtiapine (SEROQUEL) tablet 50 mg  50 mg Oral QHS Charm RingsLord, Jamison Y, NP  PTA Medications: No medications prior to admission.    Musculoskeletal: Strength & Muscle Tone: within normal limits Gait & Station: normal Patient leans: N/A  Psychiatric Specialty Exam: Physical Exam  Review of Systems  Constitutional: Negative.  Negative for chills, fever and malaise/fatigue.  HENT: Negative.  Negative for congestion, hearing loss and sore throat.   Eyes: Negative.  Negative for blurred vision, double vision and discharge.  Respiratory: Negative.  Negative for cough, shortness of breath and wheezing.   Gastrointestinal: Negative.  Negative for abdominal pain, constipation, diarrhea, heartburn, nausea and vomiting.  Musculoskeletal: Negative.  Negative for falls and myalgias.  Skin: Negative.  Negative for rash.  Neurological: Negative.  Negative for dizziness, tingling, seizures, loss of consciousness and headaches.  Endo/Heme/Allergies: Negative.  Negative for environmental allergies.  Psychiatric/Behavioral: Positive for hallucinations and substance abuse. Negative for depression, memory loss and suicidal ideas. The patient is nervous/anxious and has insomnia.        Manic, psychotic    Blood pressure 135/87, pulse 88, temperature 98.4 F (36.9 C),  temperature source Oral, resp. rate 18, height 5\' 7"  (1.702 m), weight 81.6 kg, last menstrual period 12/06/2018, SpO2 99 %.Body mass index is 28.19 kg/m.  General Appearance: Disheveled  Eye Contact:  Good  Speech:  Clear and Coherent and Pressured  Volume:  Normal  Mood:  Anxious and Euphoric  Affect:  Congruent and Labile  Thought Process:  Disorganized, Irrelevant and Descriptions of Associations: Tangential  Orientation:  Other:  place and person  Thought Content:  Illogical, Delusions, Hallucinations: Auditory, Ideas of Reference:   Paranoia Delusions and Paranoid Ideation  Suicidal Thoughts:  No  Homicidal Thoughts:  No  Memory:  Immediate;   Fair Recent;   Fair Remote;   Fair  Judgement:  Poor  Insight:  Lacking  Psychomotor Activity:  Increased and Mannerisms  Concentration:  Concentration: Fair and Attention Span: Fair  Recall:  FiservFair  Fund of Knowledge:  Fair  Language:  Fair  Akathisia:  No  Handed:  Right  AIMS (if indicated):     Assets:  Desire for Improvement Housing Physical Health Social Support Transportation  ADL's:  Impaired  Cognition:  WNL  Sleep:  Number of Hours: 3    Treatment Plan Summary: Daily contact with patient to assess and evaluate symptoms and progress in treatment and Medication management  Observation Level/Precautions:  15 minute checks  Laboratory:  ct scan of head ordered. Lipid panel and HbA1C  Psychotherapy: Patient to participate in groups, work on individual goals while here on the unit  Medications: Patient was started on Depakote ER and Seroquel after consent was obtained last night, patient refused. Patient started on Zyprexa Zydis 5 mg twice daily as needed agitation the risks and benefits were discussed with patient she was agreeable with this plan  Consultations: CT scan of head ordered to rule out any organic pathology  Discharge Concerns: For patient to safely and effectively participate in outpatient treatment   Estimated LOS: 7 days  Other:     Physician Treatment Plan for Primary Diagnosis: Bipolar affective disorder, currently manic, moderate (HCC) Long Term Goal(s): Improvement in symptoms so as ready for discharge  Short Term Goals: Ability to verbalize feelings will improve, Ability to identify and develop effective coping behaviors will improve, Ability to maintain clinical measurements within normal limits will improve and Ability to identify triggers associated with substance abuse/mental health issues will improve  Physician Treatment Plan for Secondary Diagnosis: Active Problems:   Bipolar  affective disorder, currently manic, moderate ([place])  Long Term Goal(s): Improvement in symptoms so as ready for discharge  Short Term Goals: Ability to identify changes in lifestyle to reduce recurrence of condition will improve, Ability to demonstrate self-control will improve, Ability to maintain clinical measurements within normal limits will improve, Compliance with prescribed medications will improve and Ability to identify triggers associated with substance abuse/mental health issues will improve  I certify that inpatient services furnished can reasonably be expected to improve the patient's condition.    Hampton Abbot, MD 7/21/202011:02 AM

## 2018-12-16 NOTE — BHH Counselor (Signed)
CSW attempted to complete PSA with the patient, however the patient was asleep.  CSW was advised to allow the patient time to sleep by the BMU Director Jilda Roche and Dr. Dwyane Dee.  CSW will try again at a later time.   Assunta Curtis, MSW, LCSW 12/16/2018 9:50 AM

## 2018-12-16 NOTE — Progress Notes (Signed)
Patient is demanding and not willing to follow directions.Called 911.Refused to take medications including Insulin this morning.Patient stripped off her cloths in front of the nurses station states "I am loosing my integrity If you guys are not listening me."Covered patients necked ness and assisted to room.

## 2018-12-16 NOTE — Plan of Care (Signed)
Patient states "I am here to take my antibiotic and Insulin.I don't need anything else from you."Patient is very adamant and resistive to care.Patient showing the wound on her belly everyone states "none of the nurses or the doctor looked at this."When the writer asked to show the wound ,patient refused states "I have the right to refuse."Patient talked to the leadership multiple times today.

## 2018-12-16 NOTE — Tx Team (Signed)
Initial Treatment Plan 12/16/2018 4:49 AM Haley [name] KZS:[id number]    PATIENT STRESSORS: Financial difficulties Health problems Marital or family conflict Medication change or noncompliance Substance abuse   PATIENT STRENGTHS: Capable of independent living Motivation for treatment/growth Religious Affiliation Supportive family/friends   PATIENT IDENTIFIED PROBLEMS: Substance Abuse     Financial Problem                 DISCHARGE CRITERIA:  Adequate post-discharge living arrangements Medical problems require only outpatient monitoring Motivation to continue treatment in a less acute level of care  PRELIMINARY DISCHARGE PLAN: Outpatient therapy Participate in family therapy  PATIENT/FAMILY INVOLVEMENT: This treatment plan has been presented to and reviewed with the patient, Haley [name], The patient and family have been given the opportunity to ask questions and make suggestions.  Harl Bowie, RN 12/16/2018, 4:49 AM

## 2018-12-16 NOTE — BHH Suicide Risk Assessment (Signed)
[facility] Admission Suicide Risk Assessment   Nursing information obtained from:    Demographic factors:  Adolescent or young adult, Unemployed, Caucasian Current Mental Status:  NA Loss Factors:  Loss of significant relationship, Decline in physical health Historical Factors:  Impulsivity Risk Reduction Factors:  Sense of responsibility to family  Total Time spent with patient: 45 minutes Principal Problem: Bipolar affective disorder, currently manic, moderate (HCC) Diagnosis:  Principal Problem:   Bipolar affective disorder, currently manic, moderate ([place]) Active Problems:   Psychosis ([place])  Subjective Data: Patient is a 33 year old female who presented to [facility] ED for hallucinations, paranoia and agitated behavior.  Please see H&P for details  Continued Clinical Symptoms:  Alcohol Use Disorder Identification Test Final Score (AUDIT): 1 The "Alcohol Use Disorders Identification Test", Guidelines for Use in Primary Care, Second Edition.  World Pharmacologist [facility]). Score between 0-7:  no or low risk or alcohol related problems. Score between 8-15:  moderate risk of alcohol related problems. Score between 16-19:  high risk of alcohol related problems. Score 20 or above:  warrants further diagnostic evaluation for alcohol dependence and treatment.   CLINICAL FACTORS:   Severe Anxiety and/or Agitation Bipolar Disorder:   Mixed State Alcohol/Substance Abuse/Dependencies More than one psychiatric diagnosis Currently Psychotic   Musculoskeletal: Strength & Muscle Tone: within normal limits Gait & Station: normal Patient leans: N/A  Psychiatric Specialty Exam: Physical Exam  ROS  Blood pressure 135/87, pulse 88, temperature 98.4 F (36.9 C), temperature source Oral, resp. rate 18, height 5\' 7"  (1.702 m), weight 81.6 kg, last menstrual period 12/10/2018, SpO2 99 %.Body mass index is 28.19 kg/m.    COGNITIVE FEATURES THAT CONTRIBUTE TO RISK:  Closed-mindedness, Loss of  executive function and Thought constriction (tunnel vision)    SUICIDE RISK:   Minimal: No identifiable suicidal ideation.  Patients presenting with no risk factors but with morbid ruminations; may be classified as minimal risk based on the severity of the depressive symptoms  PLAN OF CARE: Patient is a 33 year old female who presents with a symptoms of mania along with psychosis.  Patient has no previous psychiatric history, collateral was obtained from family.  Patient does admit to marijuana use but no other illicit drug use.  Based on the information, patient would benefit from a CT scan of the head to rule out any organic pathology.  Also patient refuses psychotropic medication but is willing to take Zyprexa Zydis 5 mg.  Patient has not slept, has been obsessed with a game on Facebook, is delusional, paranoid at times.  Discussed with Dr. Mallie Darting about a second opinion for medication management for forced medications if patient continues to refuse psychotropic medications  I certify that inpatient services furnished can reasonably be expected to improve the patient's condition.   Hampton Abbot, MD 12/16/2018, 4:15 PM

## 2018-12-16 NOTE — Progress Notes (Signed)
Patient came out of the room with an underwear on asking for soap.Redirected back to room.Patient is very labile.States "you guys are restricting my rights.I am not taking any medical treatment."Patient did not allow staff to look at the abscess on her abdomen and put a dressing on.Patient told staff early that if we will let her use the phone she will take the pills.But patient refused after the phone call.

## 2018-12-17 LAB — THYROID PANEL WITH TSH
Free Thyroxine Index: 2.8 (ref 1.2–4.9)
T3 Uptake Ratio: 31 % (ref 24–39)
T4, Total: 9 ug/dL (ref 4.5–12.0)
TSH: 1.42 u[IU]/mL (ref 0.450–4.500)

## 2018-12-17 LAB — GLUCOSE, CAPILLARY
Glucose-Capillary: 215 mg/dL — ABNORMAL HIGH (ref 70–99)
Glucose-Capillary: 226 mg/dL — ABNORMAL HIGH (ref 70–99)
Glucose-Capillary: 121 mg/dL — ABNORMAL HIGH (ref 70–99)
Glucose-Capillary: 169 mg/dL — ABNORMAL HIGH (ref 70–99)

## 2018-12-17 LAB — HEMOGLOBIN A1C
Hgb A1c MFr Bld: 10.3 % — ABNORMAL HIGH (ref 4.8–5.6)
Mean Plasma Glucose: 248.91 mg/dL

## 2018-12-17 MED ORDER — METFORMIN HCL 500 MG PO TABS
500.0000 mg | ORAL_TABLET | Freq: Two times a day (BID) | ORAL | Status: DC
  Administered 2018-12-17 – 2018-12-18 (×2): 500 mg via ORAL
  Filled 2018-12-17 (×2): qty 1

## 2018-12-17 MED ORDER — CLINDAMYCIN HCL 150 MG PO CAPS
300.0000 mg | ORAL_CAPSULE | Freq: Four times a day (QID) | ORAL | Status: DC
  Administered 2018-12-17 – 2018-12-19 (×6): 300 mg via ORAL
  Filled 2018-12-17 (×10): qty 2

## 2018-12-17 MED ORDER — MUPIROCIN CALCIUM 2 % EX CREA
TOPICAL_CREAM | Freq: Two times a day (BID) | CUTANEOUS | Status: DC
  Administered 2018-12-18: 17:00:00 via TOPICAL
  Filled 2018-12-17: qty 15

## 2018-12-17 MED ORDER — CLINDAMYCIN PHOSPHATE 1 % EX GEL
1.0000 "application " | Freq: Two times a day (BID) | CUTANEOUS | Status: DC
  Administered 2018-12-17 – 2018-12-18 (×3): 1 via TOPICAL
  Filled 2018-12-17: qty 30

## 2018-12-17 MED ORDER — QUETIAPINE FUMARATE 200 MG PO TABS
200.0000 mg | ORAL_TABLET | Freq: Every day | ORAL | Status: DC
  Administered 2018-12-17: 22:00:00 200 mg via ORAL
  Filled 2018-12-17: qty 1

## 2018-12-17 NOTE — BHH Counselor (Signed)
LCSW Group Therapy Note  12/17/2018 2:28 PM  Type of Therapy/Topic:  Group Therapy:  Emotion Regulation  Participation Level:  Active   Description of Group:   The purpose of this group is to assist patients in learning to regulate negative emotions and experience positive emotions. Patients will be guided to discuss ways in which they have been vulnerable to their negative emotions. These vulnerabilities will be juxtaposed with experiences of positive emotions or situations, and patients will be challenged to use positive emotions to combat negative ones. Special emphasis will be placed on coping with negative emotions in conflict situations, and patients will process healthy conflict resolution skills.  Therapeutic Goals: 1. Patient will identify two positive emotions or experiences to reflect on in order to balance out negative emotions 2. Patient will label two or more emotions that they find the most difficult to experience 3. Patient will demonstrate positive conflict resolution skills through discussion and/or role plays  Summary of Patient Progress: Patient was present for group. Patient shared how for her emotion regulation means controlling her emotions. She shared that she prays, walks, meditates and secludes herself when she has lost control of her emotions.  Patient engaged in the art activity and was supportive of others.   Therapeutic Modalities:   Cognitive Behavioral Therapy Feelings Identification Dialectical Behavioral Therapy  Assunta Curtis, MSW, LCSW 12/17/2018 2:28 PM

## 2018-12-17 NOTE — Progress Notes (Signed)
Recreation Therapy Notes  INPATIENT RECREATION THERAPY ASSESSMENT  Patient Details Name: Haley [name] MRN: [id number] DOB: 04/08/1986 Today's Date: 12/17/2018       Information Obtained From: Patient  Able to Participate in Assessment/Interview: Yes  Patient Presentation: Responsive  Reason for Admission (Per Patient): Active Symptoms  Patient Stressors:    Coping Skills:   Art  Leisure Interests (2+):  Games - Bingo, Medical laboratory scientific officer - Programmer, multimedia (Comment), Individual - Phone, Crafts - Sewing  Frequency of Recreation/Participation: Monthly  Awareness of Community Resources:     [employer]:     Current Use:    If no, Barriers?:    Expressed Interest in [place] of Residence:  Insurance underwriter  Patient Main Form of Transportation: Musician  Patient Strengths:  I do not lie  Patient Identified Areas of Improvement:  More patience  Patient Goal for Hospitalization:  Get diabetes under control and manage my weight  Current SI (including self-harm):  No  Current HI:  No  Current AVH: No  Staff Intervention Plan: Collaborate with Interdisciplinary Treatment Team, Group Attendance  Consent to Intern Participation: N/A  Tukker Byrns 12/17/2018, 2:43 PM

## 2018-12-17 NOTE — Plan of Care (Signed)
Irritable, anger and verbally aggressive. Demanding and refusing to participate in group activities. Guarded and resisting to care.

## 2018-12-17 NOTE — Progress Notes (Signed)
[facility]  12/17/2018 9:41 AM Nedra HaiDenea Creque  MRN:  [id number] Subjective: Patient is a 33 year old female who was admitted to the psychiatric unit at [facility] on 12/16/2018 secondary to manic behaviors as well as hallucinations.  Objective: Patient is seen and examined.  Patient is a 33 year old female with a reported negative past psychiatric history who presented to the [facility] on 12/14/2018 with weakness, fatigue, right lower quadrant pain, back pain, and apparently hallucinations.  She had traveled from [place] 3 days ago after losing her job.  At that time she stated her mother was very ill, and she had come here to take care of her.  She admitted that time that she was having trouble sleeping, and had been smoking marijuana "mixed together" with some other substance to help her stay awake.  She stated that she had started having hallucinations after smoking this concoction.  She stated today that she has been having trouble sleeping for an unspecified amount of time.  She was smoking the marijuana and attempt to sleep.  She denied any previous psychiatric treatment.  She stated that she had recently been fired from her job because that accused her of falsifying her time records.  She stated that that had not been true.  During the course of the interview it did not appear that she was responding to any internal stimuli, but was very hypervigilant, and paranoid.  She refused to sign a release of information for me to be able to talk to her mother.  She stated that her father was in [place], [place], and I could talk to him.  She was pressured, tangential.  She focused on her diabetes and abscess.  She stated that "I want to learn how to give myself insulin".  She stated that her primary care provider in [place] had written for her Trulicity, but she had had problems with that.  She stated that she did not have great knowledge on how to manage her  diabetes.  We are unable to find any records in the electronic medical record from [place] with regard to any of her treatment for psychiatric or medical reasons.  She also admitted that she had hydroadenitis supprative and needed medications for that.  The notes in the chart stated that she had not had her medications for a week, but she was evasive about that.  Reportedly she has been refusing psychiatric medicines since she has been here.  She did take all the capsules and her injection of insulin this a.m.  Her vital signs are stable, she is afebrile.  She told me that she has slept well last night, but nursing notes reflect that she only slept 3.5 hours.  Her white blood cell count was slightly elevated at 10.7.  Her hemoglobin A1c was 10.3.  Urine pregnancy test was negative, thyroid function studies were negative.  Drug screen was positive for marijuana only.  Principal Problem: Bipolar affective disorder, currently manic, moderate (HCC) Diagnosis: Principal Problem:   Bipolar affective disorder, currently manic, moderate (HCC) Active Problems:   Psychosis (HCC)  Total Time spent with patient: 30 minutes  Past Psychiatric History: See admission H&P  Past Medical History:  Past Medical History:  Diagnosis Date  . Diabetes mellitus     Past Surgical History:  Procedure Laterality Date  . ROOT CANAL  05/17/2011   Family History: History reviewed. No pertinent family history. Family Psychiatric  History: See admission H&P Social History:  Social History   Substance and Sexual Activity  Alcohol Use Yes   Comment: occ     Social History   Substance and Sexual Activity  Drug Use No    Social History   Socioeconomic History  . Marital status: Single    Spouse name: Not on file  . Number of children: Not on file  . Years of education: Not on file  . Highest education level: Not on file  Occupational History  . Not on file  Social Needs  . Financial resource strain: Not on  file  . Food insecurity    Worry: Not on file    Inability: Not on file  . Transportation needs    Medical: Not on file    Non-medical: Not on file  Tobacco Use  . Smoking status: Former Games developermoker  . Smokeless tobacco: Never Used  Substance and Sexual Activity  . Alcohol use: Yes    Comment: occ  . Drug use: No  . Sexual activity: Not Currently  Lifestyle  . Physical activity    Days per week: Not on file    Minutes per session: Not on file  . Stress: Not on file  Relationships  . Social Musicianconnections    Talks on phone: Not on file    Gets together: Not on file    Attends religious service: Not on file    Active member of club or organization: Not on file    Attends meetings of clubs or organizations: Not on file    Relationship status: Not on file  Other Topics Concern  . Not on file  Social History Narrative  . Not on file   Additional Social History:    History of alcohol / drug use?: No history of alcohol / drug abuse                    Sleep: Poor  Appetite:  Fair  Current Medications: Current Facility-Administered Medications  Medication Dose Route Frequency Provider Last Rate Last Dose  . acetaminophen (TYLENOL) tablet 650 mg  650 mg Oral Q6H PRN Charm RingsLord, Jamison Y, NP      . alum & mag hydroxide-simeth (MAALOX/MYLANTA) 200-200-20 MG/5ML suspension 30 mL  30 mL Oral Q4H PRN Charm RingsLord, Jamison Y, NP      . clindamycin (CLEOCIN) capsule 150 mg  150 mg Oral Q6H Thomspon, Adela LankJacqueline, NP   150 mg at 12/17/18 0646  . divalproex (DEPAKOTE) DR tablet 500 mg  500 mg Oral Q12H Charm RingsLord, Jamison Y, NP   500 mg at 12/17/18 [id number]  . insulin aspart (novoLOG) injection 0-20 Units  0-20 Units Subcutaneous TID WC Charm RingsLord, Jamison Y, NP   3 Units at 12/17/18 [phone]  . insulin aspart (novoLOG) injection 0-5 Units  0-5 Units Subcutaneous QHS Charm RingsLord, Jamison Y, NP   4 Units at 12/16/18 2106  . magnesium hydroxide (MILK OF MAGNESIA) suspension 30 mL  30 mL Oral Daily PRN Charm RingsLord, Jamison Y, NP       . multivitamin with minerals tablet 1 tablet  1 tablet Oral Daily Nelly RoutKumar, Archana, MD   1 tablet at 12/17/18 [phone]  . OLANZapine zydis (ZYPREXA) disintegrating tablet 5 mg  5 mg Oral BID PRN Nelly RoutKumar, Archana, MD   5 mg at 12/16/18 2107  . protein supplement (ENSURE MAX) liquid  11 oz Oral BID Nelly RoutKumar, Archana, MD      . QUEtiapine (SEROQUEL) tablet 100 mg  100 mg Oral QHS Nelly RoutKumar, Archana, MD  100 mg at 12/16/18 2107    Lab Results:  Results for orders placed or performed during the hospital encounter of 12/15/18 (from the past 48 hour(s))  Glucose, capillary     Status: Abnormal   Collection Time: 12/16/18  7:05 AM  Result Value Ref Range   Glucose-Capillary 213 (H) 70 - 99 mg/dL  Glucose, capillary     Status: Abnormal   Collection Time: 12/16/18 11:35 AM  Result Value Ref Range   Glucose-Capillary 309 (H) 70 - 99 mg/dL  Lipid panel     Status: Abnormal   Collection Time: 12/16/18  4:40 PM  Result Value Ref Range   Cholesterol 107 0 - 200 mg/dL   Triglycerides 62 <782<150 mg/dL   HDL 32 (L) >95>40 mg/dL   Total CHOL/HDL Ratio 3.3 RATIO   VLDL 12 0 - 40 mg/dL   LDL Cholesterol 63 0 - 99 mg/dL    Comment:        Total Cholesterol/HDL:CHD Risk Coronary Heart Disease Risk Table                     Men   Women  1/2 Average Risk   3.4   3.3  Average Risk       5.0   4.4  2 X Average Risk   9.6   7.1  3 X Average Risk  23.4   11.0        Use the calculated Patient Ratio above and the CHD Risk Table to determine the patient's CHD Risk.        ATP III CLASSIFICATION (LDL):  <100     mg/dL   Optimal  [id number]  mg/dL   Near or Above                    Optimal  130-159  mg/dL   Borderline  [phone]  mg/dL   High  >962>190     mg/dL   Very High Performed at [facility], [address] [place], [place] [address]   Thyroid Panel With TSH     Status: None   Collection Time: 12/16/18  4:40 PM  Result Value Ref Range   TSH 1.420 0.450 - 4.500 uIU/mL   T4, Total 9.0 4.5 - 12.0 ug/dL   T3  Uptake Ratio 31 24 - 39 %   Free Thyroxine Index 2.8 1.2 - 4.9    Comment: (NOTE) Performed At: [facility] [address] [place], [place] [id number] Jolene SchimkeNagendra Sanjai MD VO:[phone]   Glucose, capillary     Status: Abnormal   Collection Time: 12/16/18  9:03 PM  Result Value Ref Range   Glucose-Capillary 345 (H) 70 - 99 mg/dL  Glucose, capillary     Status: Abnormal   Collection Time: 12/17/18  6:51 AM  Result Value Ref Range   Glucose-Capillary 215 (H) 70 - 99 mg/dL   Comment 1 Notify RN     Blood Alcohol level:  No results found for: [facility]  Metabolic Disorder Labs: Lab Results  Component Value Date   HGBA1C 10.3 (H) 12/14/2018   MPG 248.91 12/14/2018   No results found for: PROLACTIN Lab Results  Component Value Date   CHOL 107 12/16/2018   TRIG 62 12/16/2018   HDL 32 (L) 12/16/2018   CHOLHDL 3.3 12/16/2018   VLDL 12 12/16/2018   LDLCALC 63 12/16/2018    Physical Findings: AIMS: Facial and Oral Movements Muscles of Facial Expression: None, normal Lips  and Perioral Area: None, normal Jaw: None, normal Tongue: None, normal,Extremity Movements Upper (arms, wrists, hands, fingers): None, normal Lower (legs, knees, ankles, toes): None, normal, Trunk Movements Neck, shoulders, hips: None, normal, Overall Severity Severity of abnormal movements (highest score from questions above): None, normal Incapacitation due to abnormal movements: None, normal Patient's awareness of abnormal movements (rate only patient's report): No Awareness, Dental Status Current problems with teeth and/or dentures?: No Does patient usually wear dentures?: No  CIWA:  CIWA-Ar Total: 4 COWS:  COWS Total Score: 1  Musculoskeletal: Strength & Muscle Tone: within normal limits Gait & Station: normal Patient leans: N/A  Psychiatric Specialty Exam: Physical Exam  Nursing note and vitals reviewed. Constitutional: She is oriented to person, place, and time. She appears well-developed  and well-nourished.  HENT:  Head: Normocephalic and atraumatic.  Respiratory: Effort normal.  Neurological: She is alert and oriented to person, place, and time.    ROS  Blood pressure 114/85, pulse 95, temperature 98.1 F (36.7 C), temperature source Oral, resp. rate 15, height 5\' 7"  (1.702 m), weight 81.6 kg, last menstrual period 12/10/2018, SpO2 100 %.Body mass index is 28.19 kg/m.  General Appearance: Casual  Eye Contact:  Good  Speech:  Normal Rate  Volume:  Increased  Mood:  Dysphoric and Irritable  Affect:  Congruent  Thought Process:  Goal Directed and Descriptions of Associations: Tangential  Orientation:  Full (Time, Place, and Person)  Thought Content:  Paranoid Ideation  Suicidal Thoughts:  No  Homicidal Thoughts:  No  Memory:  Immediate;   Poor Recent;   Poor Remote;   Poor  Judgement:  Impaired  Insight:  Lacking  Psychomotor Activity:  Increased  Concentration:  Concentration: Fair and Attention Span: Fair  Recall:  [employer] of Knowledge:  Fair  Language:  Good  Akathisia:  Negative  Handed:  Right  AIMS (if indicated):     Assets:  Desire for Improvement Resilience  ADL's:  Intact  Cognition:  WNL  Sleep:  Number of Hours: 3.5     Treatment Plan Summary: Daily contact with patient to assess and evaluate symptoms and progress in treatment, Medication management and Plan : Patient is seen and examined.  Patient is a 33 year old female with the above-stated past psychiatric history is seen in follow-up.   Diagnosis: #1 suspect bipolar disorder, most recently manic versus substance-induced mood disorder/psychotic disorder, #2 poorly controlled diabetes mellitus type 2, #3 hidradenitis supprativa  Patient is seen in follow-up.  She remains paranoid, mildly tangential, agitated and irritated.  She still not sleeping.  The request for forced medications was placed.  We will attempt to negotiate with her to take oral medicines versus injectables.  Her current  medications currently include clindamycin, Depakote, insulin, Zyprexa as needed, Seroquel at at bedtime.  I will have to check this a.m., but I do think she took the Depakote.  Additionally, she was apparently on metformin in [place], and I am going to restart that at 500 mg p.o. twice daily and titrate that.  We will continue the sliding scale for now.  It may be necessary to add Lantus insulin.  Additionally for her skin issues we will continue the clindamycin, but I am also going to add Bactroban to applied to the abscess itself.  She has refused to sign a release of information for me to be able to talk to her mother or sister.  She has said that I can speak with her father in [place], but I  do not think that she has had any contact with him in several years. 1.  Continue clindamycin 150 mg p.o. every 6 hours for skin abscess. 2.  Continue Depakote DR 500 mg p.o. every 12 hours for mood stability. 3.  Continue sliding scale insulin. 4.  Add metformin 500 mg p.o. twice daily with food for diabetes mellitus. 5.  Add Bactroban 3 times daily to abscess in right lower quadrant. 6.  Continue Zyprexa 5 mg p.o. twice daily PRN agitation. 7.  Consideration of increasing Seroquel given the lack of sleep, but for now 100 mg p.o. nightly for mood stability and insomnia. 8.  We will continue to discuss with the patient the possibility of talking to more relevant family members. 9.  Attempt to get previous medical records from [place]. 10.  Disposition planning-in progress.  Antonieta Pert, MD 12/17/2018, 9:41 AM

## 2018-12-17 NOTE — Progress Notes (Addendum)
Patient is not adherent to her medical regimen, refuse to take her medication as prescribed, and has been bulling our nursing staff with derogatory language . Patient her refuced Divalprovex 500 mg as noted.

## 2018-12-17 NOTE — Progress Notes (Signed)
Patient returned to the  nurses station and requested to have the Divalproex 500 mg, and said I think I need it, patient took it without  any further issues noted.

## 2018-12-17 NOTE — Progress Notes (Signed)
Inpatient Diabetes Program Recommendations  AACE/ADA: New Consensus Statement on Inpatient Glycemic Control (2015)  Target Ranges:  Prepandial:   less than 140 mg/dL      Peak postprandial:   less than 180 mg/dL (1-2 hours)      Critically ill patients:  140 - 180 mg/dL   Lab Results  Component Value Date   GLUCAP 215 (H) 12/17/2018   HGBA1C 10.3 (H) 12/16/2018    Review of Glycemic Control Results for Haley [name], Haley [name] (MRN [id number]) as of 12/17/2018 10:30  Ref. Range 12/16/2018 07:05 12/16/2018 11:35 12/16/2018 21:03 12/17/2018 06:51  Glucose-Capillary Latest Ref Range: 70 - 99 mg/dL 213 (H) 309 (H) 345 (H) 215 (H)   Diabetes history: DM 2 Outpatient Diabetes medications: None Current orders for Inpatient glycemic control:  Novolog resistant tid with meals and HS Metformin 500 mg bid  Inpatient Diabetes Program Recommendations:    Referral received.  Agree with addition of Metformin.  May also consider adding Lantus 10 units daily.   Thanks  Adah Perl, RN, BC-ADM Inpatient Diabetes Coordinator Pager [phone] (8a-5p)

## 2018-12-17 NOTE — Tx Team (Addendum)
Interdisciplinary Treatment and Diagnostic Plan Update  12/17/2018 Time of Session: 2:30pm  Haley HaiDenea Trimarco MRN: [id number]  Principal Diagnosis: Bipolar affective disorder, currently manic, moderate (HCC)  Secondary Diagnoses: Principal Problem:   Bipolar affective disorder, currently manic, moderate (HCC) Active Problems:   Psychosis (HCC)   Current Medications:  Current Facility-Administered Medications  Medication Dose Route Frequency Provider Last Rate Last Dose  . acetaminophen (TYLENOL) tablet 650 mg  650 mg Oral Q6H PRN Charm RingsLord, Jamison Y, NP      . alum & mag hydroxide-simeth (MAALOX/MYLANTA) 200-200-20 MG/5ML suspension 30 mL  30 mL Oral Q4H PRN Charm RingsLord, Jamison Y, NP      . clindamycin (CLEOCIN) capsule 300 mg  300 mg Oral Q6H Antonieta Pertlary, Greg Lawson, MD      . clindamycin (CLINDAGEL) 1 % gel 1 application  1 application Topical BID Antonieta Pertlary, Greg Lawson, MD      . divalproex (DEPAKOTE) DR tablet 500 mg  500 mg Oral Q12H Charm RingsLord, Jamison Y, NP   500 mg at 12/17/18 [id number]  . insulin aspart (novoLOG) injection 0-20 Units  0-20 Units Subcutaneous TID WC Charm RingsLord, Jamison Y, NP   7 Units at 12/17/18 1220  . insulin aspart (novoLOG) injection 0-5 Units  0-5 Units Subcutaneous QHS Charm RingsLord, Jamison Y, NP   4 Units at 12/16/18 2106  . magnesium hydroxide (MILK OF MAGNESIA) suspension 30 mL  30 mL Oral Daily PRN Charm RingsLord, Jamison Y, NP      . metFORMIN (GLUCOPHAGE) tablet 500 mg  500 mg Oral BID WC Antonieta Pertlary, Greg Lawson, MD      . multivitamin with minerals tablet 1 tablet  1 tablet Oral Daily Nelly RoutKumar, Archana, MD   1 tablet at 12/17/18 [phone]  . mupirocin cream (BACTROBAN) 2 %   Topical BID Antonieta Pertlary, Greg Lawson, MD      . OLANZapine zydis [facility](ZYPREXA) disintegrating tablet 5 mg  5 mg Oral BID PRN Nelly RoutKumar, Archana, MD   5 mg at 12/16/18 2107  . protein supplement (ENSURE MAX) liquid  11 oz Oral BID Nelly RoutKumar, Archana, MD      . QUEtiapine (SEROQUEL) tablet 200 mg  200 mg Oral QHS Antonieta Pertlary, Greg Lawson, MD       PTA Medications: No  medications prior to admission.    Patient Stressors: Financial difficulties Health problems Marital or family conflict Medication change or noncompliance Substance abuse  Patient Strengths: Capable of independent living Motivation for treatment/growth Religious Affiliation Supportive family/friends  Treatment Modalities: Medication Management, Group therapy, Case management,  1 to 1 session with clinician, Psychoeducation, Recreational therapy.   Physician Treatment Plan for Primary Diagnosis: Bipolar affective disorder, currently manic, moderate (HCC) Long Term Goal(s): Improvement in symptoms so as ready for discharge Improvement in symptoms so as ready for discharge   Short Term Goals: Ability to verbalize feelings will improve Ability to identify and develop effective coping behaviors will improve Ability to maintain clinical measurements within normal limits will improve Ability to identify triggers associated with substance abuse/mental health issues will improve Ability to identify changes in lifestyle to reduce recurrence of condition will improve Ability to demonstrate self-control will improve Ability to maintain clinical measurements within normal limits will improve Compliance with prescribed medications will improve Ability to identify triggers associated with substance abuse/mental health issues will improve  Medication Management: Evaluate patient's response, side effects, and tolerance of medication regimen.  Therapeutic Interventions: 1 to 1 sessions, Unit Group sessions and Medication administration.  Evaluation of Outcomes: Progressing  Physician Treatment Plan for  Secondary Diagnosis: Principal Problem:   Bipolar affective disorder, currently manic, moderate (HCC) Active Problems:   Psychosis ([place])  Long Term Goal(s): Improvement in symptoms so as ready for discharge Improvement in symptoms so as ready for discharge   Short Term Goals: Ability to  verbalize feelings will improve Ability to identify and develop effective coping behaviors will improve Ability to maintain clinical measurements within normal limits will improve Ability to identify triggers associated with substance abuse/mental health issues will improve Ability to identify changes in lifestyle to reduce recurrence of condition will improve Ability to demonstrate self-control will improve Ability to maintain clinical measurements within normal limits will improve Compliance with prescribed medications will improve Ability to identify triggers associated with substance abuse/mental health issues will improve     Medication Management: Evaluate patient's response, side effects, and tolerance of medication regimen.  Therapeutic Interventions: 1 to 1 sessions, Unit Group sessions and Medication administration.  Evaluation of Outcomes: Progressing   RN Treatment Plan for Primary Diagnosis: Bipolar affective disorder, currently manic, moderate ([place]) Long Term Goal(s): Knowledge of disease and therapeutic regimen to maintain health will improve  Short Term Goals: Ability to verbalize frustration and anger appropriately will improve, Ability to demonstrate self-control, Ability to participate in decision making will improve, Ability to verbalize feelings will improve, Ability to disclose and discuss suicidal ideas and Ability to identify and develop effective coping behaviors will improve  Medication Management: RN will administer medications as ordered by provider, will assess and evaluate patient's response and provide education to patient for prescribed medication. RN will report any adverse and/or side effects to prescribing provider.  Therapeutic Interventions: 1 on 1 counseling sessions, Psychoeducation, Medication administration, Evaluate responses to treatment, Monitor vital signs and CBGs as ordered, Perform/monitor CIWA, COWS, AIMS and Fall Risk screenings as ordered,  Perform wound care treatments as ordered.  Evaluation of Outcomes: Progressing   LCSW Treatment Plan for Primary Diagnosis: Bipolar affective disorder, currently manic, moderate ([place]) Long Term Goal(s): Safe transition to appropriate next level of care at discharge, Engage patient in therapeutic group addressing interpersonal concerns.  Short Term Goals: Engage patient in aftercare planning with referrals and resources, Increase social support, Increase ability to appropriately verbalize feelings, Increase emotional regulation, Facilitate acceptance of mental health diagnosis and concerns, Facilitate patient progression through stages of change regarding substance use diagnoses and concerns and Identify triggers associated with mental health/substance abuse issues  Therapeutic Interventions: Assess for all discharge needs, 1 to 1 time with Social worker, Explore available resources and support systems, Assess for adequacy in community support network, Educate family and significant other(s) on suicide prevention, Complete Psychosocial Assessment, Interpersonal group therapy.  Evaluation of Outcomes: Progressing   Progress in Treatment: Attending groups: Yes. Participating in groups: Yes. Taking medication as prescribed: Yes. Toleration medication: Yes. Family/Significant other contact made: No, will contact:  once permission is given Patient understands diagnosis: Yes. Discussing patient identified problems/goals with staff: Yes. Medical problems stabilized or resolved: Yes. Denies suicidal/homicidal ideation: Yes. Issues/concerns per patient self-inventory: No. Other: none  New problem(s) identified: No, Describe:  none  New Short Term/Long Term Goal(s): elimination of symptoms of psychosis, medication management for mood stabilization; elimination of SI thoughts; development of comprehensive mental wellness/sobriety plan.  Patient Goals:  Pt reports "quit smoking Black & Mild's, get  the weed out of my system and get my insulin.  Manage my diabetes beacuse I have no money and no insurance."  Discharge Plan or Barriers: Patient reports plans to return to  [place] and get her belongings, [facility] and [facility].   Reason for Continuation of Hospitalization: Anxiety Depression Hallucinations Medication stabilization Suicidal ideation  Estimated Length of Stay:   1- 7 days  Recreational Therapy: Patient: N/A Patient Goal: Patient will engage in interactions with peers and staff in pro-social manner at least 2x within 5 recreation therapy group sessions  Attendees: Patient: Haley [name] 12/17/2018 3:32 PM  Physician: Dr. Jola Babinskilary, MD 12/17/2018 3:32 PM  Nursing:  12/17/2018 3:32 PM  RN Care Manager: 12/17/2018 3:32 PM  Social Worker: Penni HomansMichaela Stanfield, LCSW 12/17/2018 3:32 PM  Recreational Therapist: Hilbert BibleShay Valicia Rief, CTRS, LRT 12/17/2018 3:32 PM  Other: Iris Pertlivia Moton, LCSW 12/17/2018 3:32 PM  Other:  12/17/2018 3:32 PM  Other: 12/17/2018 3:32 PM    Scribe for Treatment Team: Harden MoMichaela J Stanfield, LCSW 12/17/2018 3:32 PM

## 2018-12-17 NOTE — BHH Counselor (Signed)
CSW spoke with CSW requesting that she be given a marker "because [name] said I could have one in my room!", room change, room rearranged, someone examine her boil on right hip, release for her father signed, and results of her blood test.  CSW attempted to explain that CSW was not able to address those issues and inform that we will follow up with the nurses, however patient began to irritated as evidenced by loud voice. CSW redirected and reframed for patient.   CSW asked nurses the questions.  CSW informed patient that she will follow up with [name] and have [name] follow up with patient in regards to the marker.  Patient became upset when CSW explained that once CSW asked nurses about blood test results and room change that results were not in and room would not be changed.  Patient then began to yell.  CSW observed RN Megan to attempt to deescalate the patient.  Appears [name] was successful and patient was later observed in the day room.  Haley [name], MSW, LCSW 12/17/2018 2:34 PM
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
## 2018-12-17 NOTE — Progress Notes (Signed)
Patient alert and oriented x 4, affect is blunted she appears less anxious , less angry interacting appropriately with peers and staff appropriately. Patient currently denies SI/HI/AVH, no distress noted, patient stated on ABT/ abscess to RLQ, afebrile, will continue to monitor.

## 2018-12-17 NOTE — Plan of Care (Signed)
  Problem: Education: Goal: Verbalization of understanding the information provided will improve Outcome: Progressing  Patient verbalized understanding of information provided.

## 2018-12-17 NOTE — Progress Notes (Signed)
Recreation Therapy Notes   Date: 12/17/2018  Time: 9:30 am   Location: Craft room   Behavioral response: N/A   Intervention Topic: Goals  Discussion/Intervention: Patient did not attend group.   Clinical Observations/Feedback:  Patient did not attend group.   Ugochi Henzler LRT/CTRS          Adriann Thau 12/17/2018 11:59 AM

## 2018-12-18 LAB — GLUCOSE, CAPILLARY
Glucose-Capillary: 189 mg/dL — ABNORMAL HIGH (ref 70–99)
Glucose-Capillary: 181 mg/dL — ABNORMAL HIGH (ref 70–99)
Glucose-Capillary: 127 mg/dL — ABNORMAL HIGH (ref 70–99)
Glucose-Capillary: 161 mg/dL — ABNORMAL HIGH (ref 70–99)

## 2018-12-18 MED ORDER — OLANZAPINE 5 MG PO TBDP
5.0000 mg | ORAL_TABLET | ORAL | Status: AC
  Administered 2018-12-18: 5 mg via ORAL
  Filled 2018-12-18: qty 1

## 2018-12-18 MED ORDER — QUETIAPINE FUMARATE 200 MG PO TABS
400.0000 mg | ORAL_TABLET | Freq: Every day | ORAL | Status: DC
  Administered 2018-12-18: 22:00:00 400 mg via ORAL
  Filled 2018-12-18: qty 2

## 2018-12-18 MED ORDER — OLANZAPINE 5 MG PO TBDP
5.0000 mg | ORAL_TABLET | ORAL | Status: AC
  Administered 2018-12-18: 14:00:00 5 mg via ORAL
  Filled 2018-12-18: qty 1

## 2018-12-18 MED ORDER — METFORMIN HCL 500 MG PO TABS
1000.0000 mg | ORAL_TABLET | Freq: Two times a day (BID) | ORAL | Status: DC
  Administered 2018-12-18 – 2018-12-19 (×2): 1000 mg via ORAL
  Filled 2018-12-18 (×2): qty 2

## 2018-12-18 MED ORDER — PRENATAL PLUS 27-1 MG PO TABS
1.0000 | ORAL_TABLET | Freq: Every day | ORAL | Status: DC
  Filled 2018-12-18: qty 1

## 2018-12-18 MED ORDER — DIVALPROEX SODIUM 500 MG PO DR TAB
500.0000 mg | DELAYED_RELEASE_TABLET | Freq: Every day | ORAL | Status: DC
  Filled 2018-12-18: qty 1

## 2018-12-18 MED ORDER — HALOPERIDOL 5 MG PO TABS: 5.0000 mg | ORAL_TABLET | Freq: Four times a day (QID) | ORAL | Status: DC | PRN

## 2018-12-18 MED ORDER — HALOPERIDOL LACTATE 5 MG/ML IJ SOLN: 10.0000 mg | Freq: Four times a day (QID) | INTRAMUSCULAR | Status: DC | PRN

## 2018-12-18 MED ORDER — DIVALPROEX SODIUM 500 MG PO DR TAB
1000.0000 mg | DELAYED_RELEASE_TABLET | Freq: Every evening | ORAL | Status: DC
  Administered 2018-12-18: 1000 mg via ORAL
  Filled 2018-12-18: qty 2

## 2018-12-18 NOTE — Progress Notes (Signed)
Patient is not threatening but requires frequent nursing checks to help maintain control., patient is in bed with eyes open.

## 2018-12-18 NOTE — B[facility] Group Notes (Signed)
Balance In Life 12/18/2018 1PM  Type of Therapy/Topic:  Group Therapy:  Balance in Life  Participation Level:  None  Description of Group:   This group will address the concept of balance and how it feels and looks when one is unbalanced. Patients will be encouraged to process areas in their lives that are out of balance and identify reasons for remaining unbalanced. Facilitators will guide patients in utilizing problem-solving interventions to address and correct the stressor making their life unbalanced. Understanding and applying boundaries will be explored and addressed for obtaining and maintaining a balanced life. Patients will be encouraged to explore ways to assertively make their unbalanced needs known to significant others in their lives, using other group members and facilitator for support and feedback.  Therapeutic Goals: 1. Patient will identify two or more emotions or situations they have that consume much of in their lives. 2. Patient will identify signs/triggers that life has become out of balance:  3. Patient will identify two ways to set boundaries in order to achieve balance in their lives:  4. Patient will demonstrate ability to communicate their needs through discussion and/or role plays  Summary of Patient Progress: Pt attended group but did not offer any input. Pt left group early when physician asked to meet with her.   Therapeutic Modalities:   Cognitive Behavioral Therapy Solution-Focused Therapy Assertiveness Training  Branae Crail Lynelle Smoke, LCSW

## 2018-12-18 NOTE — Progress Notes (Signed)
Patient is awake and seeking attention by pulling off her shirt and appearing naked coming down the hall way, patient was asked to go back to her room and put her cloths on and she compllied.

## 2018-12-18 NOTE — Progress Notes (Signed)
Recreation Therapy Notes   Date: 12/18/2018  Time: 9:30 am  Location: Craft room  Behavioral response: Appropriate   Intervention Topic: Relaxation   Discussion/Intervention:  Group content today was focused on relaxation. The group defined relaxation and identified healthy ways to relax. Individuals expressed how much time they spend relaxing. Patients expressed how much their life would be if they did not make time for themselves to relax. The group stated ways they could improve their relaxation techniques in the future.  Individuals participated in the intervention "Time to Relax" where they had a chance to experience different relaxation techniques.  Clinical Observations/Feedback:  Patient came to group and defined relaxation as doing nothing and de-stressing. She stated she likes to listen to music, play bingo, get a pedicure and a manicure when she relaxes. Individual was social with peers and staff while participating in the intervention.  Channell Quattrone LRT/CTRS         Ebon Ketchum 12/18/2018 10:39 AM
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
## 2018-12-18 NOTE — B[facility] Suicide Risk Assessment (Signed)
[place] INPATIENT:  Family/Significant Other Suicide Prevention Education  Suicide Prevention Education:  Education Completed; [place], cousin, [phone] has been identified by the patient as the family member/significant other with whom the patient will be residing, and identified as the person(s) who will aid the patient in the event of a mental health crisis (suicidal ideations/suicide attempt).  With written consent from the patient, the family member/significant other has been provided the following suicide prevention education, prior to the and/or following the discharge of the patient.  The suicide prevention education provided includes the following:  Suicide risk factors  Suicide prevention and interventions  National Suicide Hotline telephone number  [facility] assessment telephone number  [facility] Emergency Assistance [place] and/or Residential Mobile Crisis Unit telephone number  Request made of family/significant other to:  Remove weapons (e.g., guns, rifles, knives), all items previously/currently identified as safety concern.    Remove drugs/medications (over-the-counter, prescriptions, illicit drugs), all items previously/currently identified as a safety concern.  The family member/significant other verbalizes understanding of the suicide prevention education information provided.  The family member/significant other agrees to remove the items of safety concern listed above.  Cousin reports that the she advised the patient to come to the hospital to get some help on getting her medications.  She reports that she was surprised to have the patient admitted to the psych unit.    Rozann Lesches 12/18/2018, 9:03 AM

## 2018-12-18 NOTE — Plan of Care (Signed)
Patient stated that she does not want any shot and accepted PO medications after all her questions answered.Patients mood is very labile,sad and depressed at times.Arguing with staff for her rights.Patient denies SI,HI and AVH.Support and encouragement given.Patient calm down by the end of the shift.

## 2018-12-18 NOTE — Progress Notes (Signed)
[facility]  12/18/2018 9:54 AM [name]  MRN:  [id number] Subjective:  Patient is a 33 year old female who was admitted to the psychiatric unit at [facility] on 12/16/2018 secondary to manic behaviors as well as hallucinations.  Objective: Patient is seen and examined.  Patient is a 33 year old female with a probable past psychiatric history significant for bipolar disorder versus schizoaffective disorder versus substance-induced psychosis/mania.  She is seen in follow-up.  She remains agitated, irritable and accusing.  She stated that staff last night called her a " a M.F...".  She continues to state that she came to the hospital for medical reasons, and that she wants to be able to leave.  I have explained to her on multiple times so far that she is under involuntary commitment.  She initially refused Depakote last night, but went back to take it.  She did refuse that this a.m.  She wants to call a lawyer, she is told me she wants to sue me, and she wants to have the staff all investigated.  Her blood pressure stable this morning, she is mildly tachycardic at 103.  Her temperature is 97.6.  She only slept 15 to 30 minutes last night.  Her CT scan of the brain was essentially normal.  I ordered Bactroban, clindamycin gel, and an increase in her clindamycin yesterday for her abscess in her right lower quadrant.  I also ordered culture of that, but so far I do not see any of that in the chart.  Her blood sugar this morning was 189.  Principal Problem: Bipolar affective disorder, currently manic, moderate (HCC) Diagnosis: Principal Problem:   Bipolar affective disorder, currently manic, moderate (HCC) Active Problems:   Psychosis (HCC)  Total Time spent with patient: 30 minutes  Past Psychiatric History: See admission H&P  Past Medical History:  Past Medical History:  Diagnosis Date  . Diabetes mellitus     Past Surgical History:  Procedure Laterality Date  .  ROOT CANAL  05/17/2011   Family History: History reviewed. No pertinent family history. Family Psychiatric  History: See admission H&P Social History:  Social History   Substance and Sexual Activity  Alcohol Use Yes   Comment: occ     Social History   Substance and Sexual Activity  Drug Use No    Social History   Socioeconomic History  . Marital status: Single    Spouse name: Not on file  . Number of children: Not on file  . Years of education: Not on file  . Highest education level: Not on file  Occupational History  . Not on file  Social Needs  . Financial resource strain: Not on file  . Food insecurity    Worry: Not on file    Inability: Not on file  . Transportation needs    Medical: Not on file    Non-medical: Not on file  Tobacco Use  . Smoking status: Former Games developermoker  . Smokeless tobacco: Never Used  Substance and Sexual Activity  . Alcohol use: Yes    Comment: occ  . Drug use: No  . Sexual activity: Not Currently  Lifestyle  . Physical activity    Days per week: Not on file    Minutes per session: Not on file  . Stress: Not on file  Relationships  . Social Musicianconnections    Talks on phone: Not on file    Gets together: Not on file    Attends religious service: Not  on file    Active member of club or organization: Not on file    Attends meetings of clubs or organizations: Not on file    Relationship status: Not on file  Other Topics Concern  . Not on file  Social History Narrative  . Not on file   Additional Social History:    History of alcohol / drug use?: No history of alcohol / drug abuse                    Sleep: Poor  Appetite:  Good  Current Medications: Current Facility-Administered Medications  Medication Dose Route Frequency Provider Last Rate Last Dose  . acetaminophen (TYLENOL) tablet 650 mg  650 mg Oral Q6H PRN Charm RingsLord, Jamison Y, NP      . alum & mag hydroxide-simeth (MAALOX/MYLANTA) 200-200-20 MG/5ML suspension 30 mL  30  mL Oral Q4H PRN Charm RingsLord, Jamison Y, NP      . clindamycin (CLEOCIN) capsule 300 mg  300 mg Oral Q6H Antonieta Pertlary, Greg Lawson, MD   300 mg at 12/18/18 0045  . clindamycin (CLINDAGEL) 1 % gel 1 application  1 application Topical BID Antonieta Pertlary, Greg Lawson, MD   1 application at 12/18/18 [phone]  . divalproex (DEPAKOTE) DR tablet 500 mg  500 mg Oral Q12H Charm RingsLord, Jamison Y, NP   500 mg at 12/18/18 [id number]  . insulin aspart (novoLOG) injection 0-20 Units  0-20 Units Subcutaneous TID WC Charm RingsLord, Jamison Y, NP   4 Units at 12/18/18 0825  . insulin aspart (novoLOG) injection 0-5 Units  0-5 Units Subcutaneous QHS Charm RingsLord, Jamison Y, NP   0 Units at 12/17/18 0000  . magnesium hydroxide (MILK OF MAGNESIA) suspension 30 mL  30 mL Oral Daily PRN Charm RingsLord, Jamison Y, NP      . metFORMIN (GLUCOPHAGE) tablet 500 mg  500 mg Oral BID WC Antonieta Pertlary, Greg Lawson, MD   500 mg at 12/18/18 0845  . multivitamin with minerals tablet 1 tablet  1 tablet Oral Daily Nelly RoutKumar, Archana, MD   1 tablet at 12/17/18 [phone]  . mupirocin cream (BACTROBAN) 2 %   Topical BID Antonieta Pertlary, Greg Lawson, MD      . OLANZapine zydis [facility](ZYPREXA) disintegrating tablet 5 mg  5 mg Oral BID PRN Nelly RoutKumar, Archana, MD   5 mg at 12/16/18 2107  . protein supplement (ENSURE MAX) liquid  11 oz Oral BID Nelly RoutKumar, Archana, MD   11 oz at 12/18/18 0826  . QUEtiapine (SEROQUEL) tablet 200 mg  200 mg Oral QHS Antonieta Pertlary, Greg Lawson, MD   200 mg at 12/17/18 2200    Lab Results:  Results for orders placed or performed during the hospital encounter of 12/15/18 (from the past 48 hour(s))  Glucose, capillary     Status: Abnormal   Collection Time: 12/16/18 11:35 AM  Result Value Ref Range   Glucose-Capillary 309 (H) 70 - 99 mg/dL  Lipid panel     Status: Abnormal   Collection Time: 12/16/18  4:40 PM  Result Value Ref Range   Cholesterol 107 0 - 200 mg/dL   Triglycerides 62 <308<150 mg/dL   HDL 32 (L) >65>40 mg/dL   Total CHOL/HDL Ratio 3.3 RATIO   VLDL 12 0 - 40 mg/dL   LDL Cholesterol 63 0 - 99 mg/dL    Comment:         Total Cholesterol/HDL:CHD Risk Coronary Heart Disease Risk Table  Men   Women  1/2 Average Risk   3.4   3.3  Average Risk       5.0   4.4  2 X Average Risk   9.6   7.1  3 X Average Risk  23.4   11.0        Use the calculated Patient Ratio above and the CHD Risk Table to determine the patient's CHD Risk.        ATP III CLASSIFICATION (LDL):  <100     mg/dL   Optimal  100-129  mg/dL   Near or Above                    Optimal  130-159  mg/dL   Borderline  160-189  mg/dL   High  >190     mg/dL   Very High Performed at [facility], [address] [place], [place] [address]   Thyroid Panel With TSH     Status: None   Collection Time: 12/16/18  4:40 PM  Result Value Ref Range   TSH 1.420 0.450 - 4.500 uIU/mL   T4, Total 9.0 4.5 - 12.0 ug/dL   T3 Uptake Ratio 31 24 - 39 %   Free Thyroxine Index 2.8 1.2 - 4.9    Comment: (NOTE) Performed At: [facility] [place], [place] [id number] Rush Farmer MD VE:[id number]   Hemoglobin A1c     Status: Abnormal   Collection Time: 12/16/18  4:40 PM  Result Value Ref Range   Hgb A1c MFr Bld 10.3 (H) 4.8 - 5.6 %    Comment: (NOTE) Pre diabetes:          5.7%-6.4% Diabetes:              >6.4% Glycemic control for   <7.0% adults with diabetes    Mean Plasma Glucose 248.91 mg/dL    Comment: Performed at [facility] [address] [place],  [address]  Glucose, capillary     Status: Abnormal   Collection Time: 12/16/18  9:03 PM  Result Value Ref Range   Glucose-Capillary 345 (H) 70 - 99 mg/dL  Glucose, capillary     Status: Abnormal   Collection Time: 12/17/18  6:51 AM  Result Value Ref Range   Glucose-Capillary 215 (H) 70 - 99 mg/dL   Comment 1 Notify RN   Glucose, capillary     Status: Abnormal   Collection Time: 12/17/18 11:30 AM  Result Value Ref Range   Glucose-Capillary 226 (H) 70 - 99 mg/dL  Glucose, capillary     Status: Abnormal   Collection Time:  12/17/18  4:25 PM  Result Value Ref Range   Glucose-Capillary 121 (H) 70 - 99 mg/dL  Glucose, capillary     Status: Abnormal   Collection Time: 12/17/18  8:33 PM  Result Value Ref Range   Glucose-Capillary 169 (H) 70 - 99 mg/dL  Glucose, capillary     Status: Abnormal   Collection Time: 12/18/18  7:07 AM  Result Value Ref Range   Glucose-Capillary 189 (H) 70 - 99 mg/dL   Comment 1 Notify RN     Blood Alcohol level:  No results found for: [facility]  Metabolic Disorder Labs: Lab Results  Component Value Date   HGBA1C 10.3 (H) 12/16/2018   MPG 248.91 12/16/2018   MPG 248.91 12/14/2018   No results found for: PROLACTIN Lab Results  Component Value Date   CHOL 107 12/16/2018   TRIG  62 12/16/2018   HDL 32 (L) 12/16/2018   CHOLHDL 3.3 12/16/2018   VLDL 12 12/16/2018   LDLCALC 63 12/16/2018    Physical Findings: AIMS: Facial and Oral Movements Muscles of Facial Expression: None, normal Lips and Perioral Area: None, normal Jaw: None, normal Tongue: None, normal,Extremity Movements Upper (arms, wrists, hands, fingers): None, normal Lower (legs, knees, ankles, toes): None, normal, Trunk Movements Neck, shoulders, hips: None, normal, Overall Severity Severity of abnormal movements (highest score from questions above): None, normal Incapacitation due to abnormal movements: None, normal Patient's awareness of abnormal movements (rate only patient's report): No Awareness, Dental Status Current problems with teeth and/or dentures?: No Does patient usually wear dentures?: No  CIWA:  CIWA-Ar Total: 4 COWS:  COWS Total Score: 1  Musculoskeletal: Strength & Muscle Tone: within normal limits Gait & Station: normal Patient leans: N/A  Psychiatric Specialty Exam: Physical Exam  Nursing note and vitals reviewed. Constitutional: She is oriented to person, place, and time. She appears well-developed and well-nourished.  HENT:  Head: Normocephalic and atraumatic.  Respiratory: Effort  normal.  Neurological: She is alert and oriented to person, place, and time.    ROS  Blood pressure 125/68, pulse (!) 103, temperature 97.6 F (36.4 C), temperature source Oral, resp. rate 18, height 5\' 7"  (1.702 m), weight 81.6 kg, last menstrual period 12/10/2018, SpO2 100 %.Body mass index is 28.19 kg/m.  General Appearance: Casual  Eye Contact:  Good  Speech:  Pressured  Volume:  Increased  Mood:  Angry, Dysphoric and Irritable  Affect:  Labile  Thought Process:  Disorganized and Descriptions of Associations: Tangential  Orientation:  Full (Time, Place, and Person)  Thought Content:  Delusions, Paranoid Ideation and Tangential  Suicidal Thoughts:  No  Homicidal Thoughts:  No  Memory:  Immediate;   Fair Recent;   Fair Remote;   Fair  Judgement:  Impaired  Insight:  Lacking  Psychomotor Activity:  Increased  Concentration:  Concentration: Fair and Attention Span: Fair  Recall:  FiservFair  Fund of Knowledge:  Fair  Language:  Good  Akathisia:  Negative  Handed:  Right  AIMS (if indicated):     Assets:  Desire for Improvement Resilience  ADL's:  Intact  Cognition:  WNL  Sleep:  Number of Hours: 0.25     Treatment Plan Summary: Daily contact with patient to assess and evaluate symptoms and progress in treatment, Medication management and Plan : Patient is seen and examined.  Patient is a 33 year old female with the above-stated past psychiatric history who is seen in follow-up.   Diagnosis: #1 suspect bipolar disorder, most recently manic versus substance-induced mood disorder/psychotic disorder, #2 poorly controlled diabetes mellitus type 2, #3 hidradenitis supprativa, #4 cannabis use disorder  Patient is seen in follow-up.  She remains significantly manic.  She is tangential, paranoid, irritable and angry.  She is focused purely on her somatic issues.  She is currently Depakote 500 mg every 12 hours as well as Seroquel 200 mg nightly.  I think she refused the Seroquel last  night.  She did receive the Zyprexa during the day yesterday.  I will try and get her to take the Seroquel this morning, but if not we will go with the Zyprexa because it leaves she did take that somewhat yesterday.  I am also going to increase her Depakote DR to 500 mg daily in the thousand milligrams p.o. every afternoon.  Hopefully that will slow down her mania to a degree.  I did speak  with her father yesterday, and he stated that she had had issues with attention deficit disorder when she was younger, but nothing like this in the past.  He stated he had not had a great deal of contact with her.  He was going to attempt to contact her mother and see if any of her relatives felt as though there was any of this going on in [place].  With regard to her abscesses and infection.  Yesterday her oral clindamycin was increased to 300 mg p.o. twice daily.  Bactroban as well as clindamycin ointment were ordered for the wound itself.  As well, I ordered cultures on the material.  She remains afebrile, and her at least her white count on admission was normal.  Her blood sugars down to 189 today.  She was started on metformin 500 mg p.o. twice daily yesterday.  I will increase that to thousand milligrams p.o. twice daily, and continue the sliding scale at least for now.  She is at least not homicidal, not suicidal.  She is clearly psychotic and manic. 1.  Continue clindamycin 300 mg p.o. every 6 hours for abscess. 2.  Continue clindamycin gel 1% to wound twice daily for abscess. 3.  Increase Depakote ER to 500 mg p.o. daily and at thousand milligrams p.o. every afternoon for mania. 4.  Continue sliding scale insulin for diabetes. 5.  Increase metformin 2000 mg p.o. twice daily with food for diabetes. 6.  Continue Bactroban twice daily for abscess in case of staph. 7.  Awaiting culture results from material from abscess. 8.  Increase Seroquel to 400 mg p.o. nightly.  If she will not take that then we may give  haloperidol 10 mg IM.  We will write for forced medication orders today. 9.  Continue Zyprexa 5 mg twice daily PRN agitation. 10.  Disposition planning-in progress.  Antonieta Pert, MD 12/18/2018, 9:54 AM

## 2018-12-18 NOTE — Progress Notes (Signed)
Medications over objection Note   Patient is seen and examined.  Patient is a 33 year old female with a suspected past psychiatric history significant for bipolar disorder versus schizoaffective disorder versus substance-induced psychosis/mania.  Unfortunately she continues to be aggressive on the unit, threatening, profane.  She is refusing oral medications.  Is felt that in the best interest of the patient, staff and the safety and interest of staff and other patients on the unit that she be forced medications.  I agree with the admission H&P by Dr. Dwyane Dee that forced medications are necessary to ensure improvement and safety of the patient as well as staff and other patients.

## 2018-12-18 NOTE — Progress Notes (Signed)
Patient agreed to take medications multiple times this morning. At the med room patient opened the pill and threw it in the garbage.Patient refused to let staff put medication on her abdominal abscess.Patients cousin called.Stated that she is tired of her phone called and make her stay in the hospital until she feels better.Patient took her medications at this time.

## 2018-12-19 LAB — GLUCOSE, CAPILLARY
Glucose-Capillary: 208 mg/dL — ABNORMAL HIGH (ref 70–99)
Glucose-Capillary: 240 mg/dL — ABNORMAL HIGH (ref 70–99)

## 2018-12-19 MED ORDER — CLINDAMYCIN HCL 300 MG PO CAPS: 300.0000 mg | ORAL_CAPSULE | Freq: Four times a day (QID) | ORAL | 0 refills | Status: AC

## 2018-12-19 MED ORDER — CLINDAMYCIN PHOSPHATE 1 % EX GEL: 1.0000 "application " | Freq: Two times a day (BID) | CUTANEOUS | 0 refills | Status: DC

## 2018-12-19 MED ORDER — METFORMIN HCL 1000 MG PO TABS: 1000.0000 mg | ORAL_TABLET | Freq: Two times a day (BID) | ORAL | 0 refills | Status: DC

## 2018-12-19 MED ORDER — MUPIROCIN CALCIUM 2 % EX CREA: TOPICAL_CREAM | Freq: Two times a day (BID) | CUTANEOUS | 0 refills | Status: AC

## 2018-12-19 MED ORDER — QUETIAPINE FUMARATE 400 MG PO TABS: 400.0000 mg | ORAL_TABLET | Freq: Every day | ORAL | 0 refills | Status: DC

## 2018-12-19 NOTE — Progress Notes (Signed)
  [facility] Adult Case Management Discharge Plan :  Will you be returning to the same living situation after discharge:  No. At discharge, do you have transportation home?: Yes,  taxi Do you have the ability to pay for your medications: Yes,  mental health  Release of information consent forms completed and in the chart;   Patient to Follow up at: Follow-up Information    [facility] Follow up on 12/24/2018.   Why: You have a hospital discharge appointment scheduled for Wednesday 12/24/18 at 12:30PM. Please wear a mask, bring a photo id, insurance card, discharge paperwork, and list of medications to your appointment. Thank you! Contact information: [place] [id number] [phone]           Next level of care provider has access to [facility] and Suicide Prevention discussed: Yes,  SPE completed with pts cousin  Have you used any form of tobacco in the last 30 days? (Cigarettes, Smokeless Tobacco, Cigars, and/or Pipes): No  Has patient been referred to the Quitline?: N/A patient is not a smoker  Patient has been referred for addiction treatment: N/A  Delfin Edis, LCSW 12/19/2018, 9:30 AM

## 2018-12-19 NOTE — Discharge Summary (Signed)
Physician Discharge Summary Note  Patient:  Haley [name] is an 33 y.o., female MRN:  [id number] DOB:  10/14/1985 Patient phone:  [phone] (home)  Patient address:   [address] [place] [id number],  Total Time spent with patient: 30 minutes  Date of Admission:  12/15/2018 Date of Discharge: 12/19/2018  Reason for Admission: Suspected mania  Principal Problem: Bipolar affective disorder, currently manic, moderate (HCC) Discharge Diagnoses: Principal Problem:   Bipolar affective disorder, currently manic, moderate (HCC) Active Problems:   Psychosis (HCC)   Past Psychiatric History: Reportedly had been having problems with attention at a younger age.  Past Medical History:  Past Medical History:  Diagnosis Date  . Diabetes mellitus     Past Surgical History:  Procedure Laterality Date  . ROOT CANAL  05/17/2011   Family History: History reviewed. No pertinent family history. Family Psychiatric  History: Denied Social History:  Social History   Substance and Sexual Activity  Alcohol Use Yes   Comment: occ     Social History   Substance and Sexual Activity  Drug Use No    Social History   Socioeconomic History  . Marital status: Single    Spouse name: Not on file  . Number of children: Not on file  . Years of education: Not on file  . Highest education level: Not on file  Occupational History  . Not on file  Social Needs  . Financial resource strain: Not on file  . Food insecurity    Worry: Not on file    Inability: Not on file  . Transportation needs    Medical: Not on file    Non-medical: Not on file  Tobacco Use  . Smoking status: Former Games developermoker  . Smokeless tobacco: Never Used  Substance and Sexual Activity  . Alcohol use: Yes    Comment: occ  . Drug use: No  . Sexual activity: Not Currently  Lifestyle  . Physical activity    Days per week: Not on file    Minutes per session: Not on file  . Stress: Not on file  Relationships  . Social  Musicianconnections    Talks on phone: Not on file    Gets together: Not on file    Attends religious service: Not on file    Active member of club or organization: Not on file    Attends meetings of clubs or organizations: Not on file    Relationship status: Not on file  Other Topics Concern  . Not on file  Social History Narrative  . Not on file    Hospital Course: Patient was admitted from the emergency room on 12/16/2018.  The patient was from [place], and reported that she had not been sleeping, and that she had been supporting herself by playing a game on [employer].  At that time she was agitated and feels as though people were trying to control her.  It was noted that she was easily agitated, spending money excessively, and drove from [place] to [place] to be with her mother.  The mother reported to the emergency department that the patient does not make any sense, goes from one topic to the other, unable to slow down, and also at times reported that she thought the patient was hearing auditory hallucinations.  She also noted that she had been hyper religious.  She was admitted to the hospital for evaluation and stabilization.  During the course of the hospitalization the patient was angry and irritable  throughout until the date of discharge.  She was angry over the fact that she came to the hospital to be treated for her diabetes, and to have an abscess on her right lower quadrant addressed.  She stated that she did not desire to be in a psychiatric hospital and was unable to understand why she was here.  Multiple attempts to explain to her involuntary commitment were futile.  During the course the hospitalization she was placed on Seroquel and Depakote.  During the course the hospitalization she finally began to have a decrease in her pressured speech and was sleeping better.  Her irritability seemed to decrease.  She was requesting discharge on 12/19/2018.  She was not homicidal or suicidal.  Her  manic symptoms were improved.  She was unable to clarify exactly where she would follow-up after discharge.  She stated she plan to return to [place], but was then going to return to [place].  She requested medical and psychiatric follow-up in [place].  During the course the hospitalization she wanted a residential program so that she could better understand her diabetes, and to get her skin condition treated.  During the course the hospitalization because of the abscess her clindamycin was increased to an oral dosage, and she also received Bactroban and clindamycin gel.  On the date of discharge on physical examination the abscess was significantly improved and was not draining at the time.  She was afebrile throughout the course of the hospitalization.  Also during the course of hospitalization she was placed on metformin for her diabetes.  She tolerated this well.  Her blood sugar on the morning of discharge though was elevated at 240.  She seemed to tolerate the Depakote, but because the fact she was unable to give me exact follow-up location I was very concerned about the possibility of continuing her Depakote.  This was stopped and we left the Seroquel on board.  I was concerned that if she continued the Depakote, did not have liver function enzymes followed up, and did not follow-up with Depakote level that the risk outweighed the benefits.  Again, she was not homicidal or suicidal, and on the date of discharge her mood and affect were significantly improved than on admission.  It was decided she could be discharged to home.  Physical Findings: AIMS: Facial and Oral Movements Muscles of Facial Expression: None, normal Lips and Perioral Area: None, normal Jaw: None, normal Tongue: None, normal,Extremity Movements Upper (arms, wrists, hands, fingers): None, normal Lower (legs, knees, ankles, toes): None, normal, Trunk Movements Neck, shoulders, hips: None, normal, Overall  Severity Severity of abnormal movements (highest score from questions above): None, normal Incapacitation due to abnormal movements: None, normal Patient's awareness of abnormal movements (rate only patient's report): No Awareness, Dental Status Current problems with teeth and/or dentures?: No Does patient usually wear dentures?: No  CIWA:  CIWA-Ar Total: 4 COWS:  COWS Total Score: 1  Musculoskeletal: Strength & Muscle Tone: within normal limits Gait & Station: normal Patient leans: N/A  Psychiatric Specialty Exam: Physical Exam  Nursing note and vitals reviewed. Constitutional: She is oriented to person, place, and time. She appears well-developed and well-nourished.  HENT:  Head: Normocephalic and atraumatic.  Respiratory: Effort normal.  Neurological: She is alert and oriented to person, place, and time.    ROS  Blood pressure 123/78, pulse 91, temperature 97.6 F (36.4 C), temperature source Oral, resp. rate 18, height 5\' 7"  (1.702 m), weight 81.6 kg, last menstrual period  12/10/2018, SpO2 100 %.Body mass index is 28.19 kg/m.  General Appearance: Casual  Eye Contact:  Good  Speech:  Normal Rate  Volume:  Normal  Mood:  Euthymic  Affect:  Congruent  Thought Process:  Coherent and Descriptions of Associations: Circumstantial  Orientation:  Full (Time, Place, and Person)  Thought Content:  Negative  Suicidal Thoughts:  No  Homicidal Thoughts:  No  Memory:  Immediate;   Fair Recent;   Fair Remote;   Fair  Judgement:  Intact  Insight:  Lacking  Psychomotor Activity:  Normal  Concentration:  Concentration: Fair and Attention Span: Fair  Recall:  [employer] of Knowledge:  Fair  Language:  Fair  Akathisia:  Negative  Handed:  Right  AIMS (if indicated):     Assets:  Desire for Improvement Resilience  ADL's:  Intact  Cognition:  WNL  Sleep:  Number of Hours: 5.75     Have you used any form of tobacco in the last 30 days? (Cigarettes, Smokeless Tobacco, Cigars,  and/or Pipes): No  Has this patient used any form of tobacco in the last 30 days? (Cigarettes, Smokeless Tobacco, Cigars, and/or Pipes) Yes, No  Blood Alcohol level:  No results found for: [facility]  Metabolic Disorder Labs:  Lab Results  Component Value Date   HGBA1C 10.3 (H) 12/16/2018   MPG 248.91 12/16/2018   MPG 248.91 12/14/2018   No results found for: PROLACTIN Lab Results  Component Value Date   CHOL 107 12/16/2018   TRIG 62 12/16/2018   HDL 32 (L) 12/16/2018   CHOLHDL 3.3 12/16/2018   VLDL 12 12/16/2018   LDLCALC 63 12/16/2018    See Psychiatric Specialty Exam and Suicide Risk Assessment completed by Attending Physician prior to discharge.  Discharge destination:  Home  Is patient on multiple antipsychotic therapies at discharge:  No   Has Patient had three or more failed trials of antipsychotic monotherapy by history:  No  Recommended Plan for Multiple Antipsychotic Therapies: NA  Discharge Instructions    Diet - low sodium heart healthy   Complete by: As directed    Increase activity slowly   Complete by: As directed      Allergies as of 12/19/2018   No Known Allergies     Medication List    TAKE these medications     Indication  clindamycin 1 % gel Commonly known as: CLINDAGEL Apply 1 application topically 2 (two) times a day. Apply to abscess x 7 days  Indication: Skin Ulcer   clindamycin 300 MG capsule Commonly known as: CLEOCIN Take 1 capsule (300 mg total) by mouth every 6 (six) hours for 7 days.  Indication: Infection of the Skin and/or Soft Tissue   metFORMIN 1000 MG tablet Commonly known as: GLUCOPHAGE Take 1 tablet (1,000 mg total) by mouth 2 (two) times daily with a meal.  Indication: Type 2 Diabetes   mupirocin cream 2 % Commonly known as: BACTROBAN Apply topically 2 (two) times daily for 7 days. Apply to abscess  Indication: Skin Ulcer   QUEtiapine 400 MG tablet Commonly known as: SEROQUEL Take 1 tablet (400 mg total) by mouth at  bedtime.  Indication: Manic Phase of Manic-Depression      Follow-up Information    [place] Follow up on 12/24/2018.   Why: You have a hospital discharge appointment scheduled for Wednesday 12/24/18 at 12:30PM. Please wear a mask, bring a photo id, insurance card, discharge paperwork, and list of medications to your appointment.  Thank you! Contact information: [address] [place] [address] [phone]           Follow-up recommendations:  Activity:  ad lib  Comments: Patient was encouraged that if he decided to stay in [place] to contact her primary care provider for follow-up of her abscess as well as her diabetes.  She was also encouraged to take her medications as directed.  She was also encouraged not to use marijuana any longer.  Signed: Antonieta PertGreg Lawson Maudene Stotler, MD 12/19/2018, 11:33 AM

## 2018-12-19 NOTE — Progress Notes (Signed)
Patient is in her room and continues to state to this Probation officer that she is performing personal hygiene before coming out to get her morning medication. This is twice that this writer has went to get patient to come up for morning medications.

## 2018-12-19 NOTE — Progress Notes (Signed)
Patient states that she is going to come up for CBG monitoring after she takes her shower. Patient's taxicab called and stated that they were on the way so patient refused to take her medication.

## 2018-12-19 NOTE — BHH Group Notes (Signed)
LCSW Group Therapy Note  12/19/2018 1:35 PM  Type of Therapy and Topic:  Group Therapy:  Feelings around Relapse and Recovery  Participation Level:  Did Not Attend   Description of Group:    Patients in this group will discuss emotions they experience before and after a relapse. They will process how experiencing these feelings, or avoidance of experiencing them, relates to having a relapse. Facilitator will guide patients to explore emotions they have related to recovery. Patients will be encouraged to process which emotions are more powerful. They will be guided to discuss the emotional reaction significant others in their lives may have to their relapse or recovery. Patients will be assisted in exploring ways to respond to the emotions of others without this contributing to a relapse.  Therapeutic Goals: 1. Patient will identify two or more emotions that lead to a relapse for them 2. Patient will identify two emotions that result when they relapse 3. Patient will identify two emotions related to recovery 4. Patient will demonstrate ability to communicate their needs through discussion and/or role plays   Summary of Patient Progress: x    Therapeutic Modalities:   Cognitive Behavioral Therapy Solution-Focused Therapy Assertiveness Training Relapse Prevention Therapy   Pricilla Moehle, MSW, LCSW Clinical Social Work 12/19/2018 1:35 PM   

## 2018-12-19 NOTE — Progress Notes (Signed)
Patient ID: Francenia Chimenti, female   DOB: [birth year], 33 y.o.   MRN: [id number]   Discharge Note:  Patient denies SI/HI/AVH at this time. Discharge instructions, AVS, prescriptions, and transition record gone over with patient. Patient agrees to comply with medication management, follow-up visit, and outpatient therapy. Patient belongings returned to patient. Patient questions and concerns addressed and answered. Patient ambulatory off unit. Patient discharged to home via [facility].

## 2018-12-19 NOTE — Progress Notes (Signed)
Patient refused her Depakote stating that the medication makes her sleepy and she has to pack for discharge. Patient also refused her antibiotic gel and cream.

## 2018-12-19 NOTE — BHH Suicide Risk Assessment (Signed)
[facility] Discharge Suicide Risk Assessment   Principal Problem: Bipolar affective disorder, currently manic, moderate ([place]) Discharge Diagnoses: Principal Problem:   Bipolar affective disorder, currently manic, moderate (HCC) Active Problems:   Psychosis ([place])   Total Time spent with patient: 30 minutes  Musculoskeletal: Strength & Muscle Tone: within normal limits Gait & Station: normal Patient leans: N/A  Psychiatric Specialty Exam: Review of Systems  All other systems reviewed and are negative.   Blood pressure 123/78, pulse 91, temperature 97.6 F (36.4 C), temperature source Oral, resp. rate 18, height 5\' 7"  (1.702 m), weight 81.6 kg, last menstrual period 12/10/2018, SpO2 100 %.Body mass index is 28.19 kg/m.  General Appearance: Casual  Eye Contact::  Fair  Speech:  Normal Rate409  Volume:  Normal  Mood:  Euthymic  Affect:  Congruent  Thought Process:  Coherent and Descriptions of Associations: Intact  Orientation:  Full (Time, Place, and Person)  Thought Content:  Logical  Suicidal Thoughts:  No  Homicidal Thoughts:  No  Memory:  Immediate;   Fair Recent;   Fair Remote;   Fair  Judgement:  Intact  Insight:  Lacking  Psychomotor Activity:  Normal  Concentration:  Fair  Recall:  [employer] of Knowledge:Fair  Language: Fair  Akathisia:  Negative  Handed:  Right  AIMS (if indicated):     Assets:  Desire for Improvement Resilience  Sleep:  Number of Hours: 5.75  Cognition: WNL  ADL's:  Intact   Mental Status Per Nursing Assessment::   On Admission:  NA  Demographic Factors:  Low socioeconomic status, Living alone and Unemployed  Loss Factors: Decrease in vocational status  Historical Factors: Impulsivity  Risk Reduction Factors:   Sense of responsibility to family  Continued Clinical Symptoms:  Bipolar Disorder:   Mixed State Alcohol/Substance Abuse/Dependencies  Cognitive Features That Contribute To Risk:  Thought constriction (tunnel vision)     Suicide Risk:  Minimal: No identifiable suicidal ideation.  Patients presenting with no risk factors but with morbid ruminations; may be classified as minimal risk based on the severity of the depressive symptoms  Follow-up Information    [facility] Follow up on 12/24/2018.   Why: You have a hospital discharge appointment scheduled for Wednesday 12/24/18 at 12:30PM. Please wear a mask, bring a photo id, insurance card, discharge paperwork, and list of medications to your appointment. Thank you! Contact information: [place] [address] [phone]           Plan Of Care/Follow-up recommendations:  Activity:  ad lib  Sharma Covert, MD 12/19/2018, 11:26 AM

## 2019-11-12 ENCOUNTER — Encounter: Payer: Self-pay | Admitting: Family Medicine

## 2019-11-12 ENCOUNTER — Other Ambulatory Visit: Payer: Self-pay

## 2019-11-12 ENCOUNTER — Ambulatory Visit (INDEPENDENT_AMBULATORY_CARE_PROVIDER_SITE_OTHER): Payer: BC Managed Care – PPO | Admitting: Family Medicine

## 2019-11-12 VITALS — BP 120/78 | HR 91 | Temp 98.0°F | Ht 65.5 in | Wt 240.0 lb

## 2019-11-12 DIAGNOSIS — L732 Hidradenitis suppurativa: Secondary | ICD-10-CM

## 2019-11-12 DIAGNOSIS — E119 Type 2 diabetes mellitus without complications: Secondary | ICD-10-CM

## 2019-11-12 DIAGNOSIS — E1165 Type 2 diabetes mellitus with hyperglycemia: Secondary | ICD-10-CM | POA: Insufficient documentation

## 2019-11-12 DIAGNOSIS — D649 Anemia, unspecified: Secondary | ICD-10-CM | POA: Diagnosis not present

## 2019-11-12 LAB — POCT GLYCOSYLATED HEMOGLOBIN (HGB A1C): Hemoglobin A1C: 12.6 % — AB (ref 4.0–5.6)

## 2019-11-12 MED ORDER — METFORMIN HCL 1000 MG PO TABS: 1000.0000 mg | ORAL_TABLET | Freq: Two times a day (BID) | ORAL | 1 refills | Status: DC

## 2019-11-12 MED ORDER — BLOOD GLUCOSE METER KIT: PACK | 0 refills | Status: DC

## 2019-11-12 MED ORDER — GLIPIZIDE 5 MG PO TABS: 5.0000 mg | ORAL_TABLET | Freq: Two times a day (BID) | ORAL | 3 refills | Status: DC

## 2019-11-12 MED ORDER — CLINDAMYCIN PHOSPHATE 1 % EX GEL: 1.0000 "application " | Freq: Two times a day (BID) | CUTANEOUS | 0 refills | Status: DC

## 2019-11-12 NOTE — Assessment & Plan Note (Signed)
Discussed that metformin can help. Will start with metformin and topical Abx. Return in 4 weeks. Will get outside [facility] records.

## 2019-11-12 NOTE — Patient Instructions (Signed)
Your hemoglobin A1c was elevated and shows that you have diabetes.   Diabetes is treated with diet and medication- avoid sugar (especially sugary beverages like soda and sweet tea), carbohydrates - like baked goods and bread. Try to eat lean protein (fish and chicken) and lots of green veggies. You can go to Diabetes.org for more information on dietary changes and call the clinic and I can place a referral to see a diabetes nutritionist.   Take 1000 mg in the morning After a few days Take 1000 mg twice daily   After you are tolerating metformin twice daily  You can start Glipizide Take Glipizide 5 mg in the morning with food After 1 week, increase to 5 mg twice daily with food  Return in 1 month

## 2019-11-12 NOTE — Assessment & Plan Note (Signed)
Poorly controled diabetes. Pt is interested in making life style changes and has had some success in the past. No regular medication for >1 year. Restart metformin. Start glipizide once metformin tolerated. Check glucose a few times a week. Return in 1 month. She is interested in insulin due to successful control during a hospital stay. Discussed focusing on diet and exercise and seeing if oral medication helps lower glucose.

## 2019-11-12 NOTE — Progress Notes (Signed)
Subjective:     Haley [name] is a 34 y.o. female presenting for New Patient (Initial Visit) (Wants to discuss DM and insulin.  Also, requests oral med for boils. ) and Wrist Pain (C/o right wrist pain. Occasionally radiates up to elbow.  Started about 3 mos ago.  Also, has a cyst on wrist.  )     HPI  #Diabetes - prescribed metformin 11/2018  - no insurance - has been taking every once in a while - is interested in insulin - typically she is running in the 300s - but does not have her meter - going to the bathroom - was on trulicity at one point in time - but not sure how helpful that was - Previous treatment: fragxia, trulicity, metformin - denies blurry vision - has been months since she checked her CBG - no special diet  - had a period of time where she had lost some weight    #hx of bipolar - no issues since her hospitalizations - was smoking Marijuana during the diagnosis   Review of Systems   Social History   Tobacco Use  Smoking Status Current Every Day Smoker  . Years: 15.00  . Types: Cigars  Smokeless Tobacco Never Used  Tobacco Comment   1-2 cigars per day        Objective:    BP Readings from Last 3 Encounters:  11/12/19 120/78  12/19/18 123/78  12/15/18 127/79   Wt Readings from Last 3 Encounters:  11/12/19 240 lb (108.9 kg)  12/15/18 180 lb (81.6 kg)  12/14/18 240 lb (108.9 kg)    BP 120/78 (BP Location: Left Arm, Patient Position: Sitting, Cuff Size: Large)   Pulse 91   Temp 98 F (36.7 C) (Temporal)   Ht 5' 5.5" (1.664 m)   Wt 240 lb (108.9 kg)   LMP 11/05/2019   SpO2 98%   BMI 39.33 kg/m    Physical Exam Constitutional:      General: She is not in acute distress.    Appearance: She is well-developed. She is not diaphoretic.  HENT:     Right Ear: External ear normal.     Left Ear: External ear normal.  Eyes:     Conjunctiva/sclera: Conjunctivae normal.  Cardiovascular:     Rate and Rhythm: Normal rate.  Pulmonary:      Effort: Pulmonary effort is normal. No respiratory distress.  Musculoskeletal:     Cervical back: Neck supple.  Skin:    General: Skin is warm and dry.     Capillary Refill: Capillary refill takes less than 2 seconds.     Comments: Bilateral armpits with boils and swellings. Inner thigh with a lesion that recently ruptured.   Neurological:     Mental Status: She is alert. Mental status is at baseline.  Psychiatric:        Mood and Affect: Mood normal.        Behavior: Behavior normal.           Assessment & Plan:   Problem List Items Addressed This Visit      Endocrine   Type 2 diabetes mellitus without complication, without long-term current use of insulin (HCC) - Primary    Poorly controled diabetes. Pt is interested in making life style changes and has had some success in the past. No regular medication for >1 year. Restart metformin. Start glipizide once metformin tolerated. Check glucose a few times a week. Return in 1 month. She is interested  in insulin due to successful control during a hospital stay. Discussed focusing on diet and exercise and seeing if oral medication helps lower glucose.       Relevant Medications   metFORMIN (GLUCOPHAGE) 1000 MG tablet   glipiZIDE (GLUCOTROL) 5 MG tablet   Other Relevant Orders   POCT glycosylated hemoglobin (Hb A1C) (Completed)   Comprehensive metabolic panel   CBC   Lipid panel     Musculoskeletal and Integument   Hidradenitis suppurativa    Discussed that metformin can help. Will start with metformin and topical Abx. Return in 4 weeks. Will get outside [facility] records.       Relevant Medications   clindamycin (CLINDAGEL) 1 % gel    Other Visit Diagnoses    Recurrent boils       Anemia, unspecified type       Relevant Orders   CBC       Return in about 4 weeks (around 12/10/2019).  Lesleigh Noe, MD  This visit occurred during the SARS-CoV-2 public health emergency.  Safety protocols were in place, including  screening questions prior to the visit, additional usage of staff PPE, and extensive cleaning of exam room while observing appropriate contact time as indicated for disinfecting solutions.

## 2019-11-13 LAB — COMPREHENSIVE METABOLIC PANEL
ALT: 23 U/L (ref 0–35)
AST: 32 U/L (ref 0–37)
Albumin: 3.5 g/dL (ref 3.5–5.2)
Alkaline Phosphatase: 83 U/L (ref 39–117)
BUN: 6 mg/dL (ref 6–23)
CO2: 26 mEq/L (ref 19–32)
Calcium: 9 mg/dL (ref 8.4–10.5)
Chloride: 100 mEq/L (ref 96–112)
Creatinine, Ser: 0.59 mg/dL (ref 0.40–1.20)
GFR: 139.22 mL/min (ref >60.00)
Glucose, Bld: 473 mg/dL — ABNORMAL HIGH (ref 70–99)
Potassium: 4.3 mEq/L (ref 3.5–5.1)
Sodium: 131 mEq/L — ABNORMAL LOW (ref 135–145)
Total Bilirubin: 0.3 mg/dL (ref 0.2–1.2)
Total Protein: 7.3 g/dL (ref 6.0–8.3)

## 2019-11-13 LAB — LIPID PANEL
Cholesterol: 111 mg/dL (ref 0–200)
HDL: 33.2 mg/dL — ABNORMAL LOW (ref >39.00)
LDL Cholesterol: 62 mg/dL (ref 0–99)
NonHDL: 78.27
Total CHOL/HDL Ratio: 3
Triglycerides: 82 mg/dL (ref 0.0–149.0)
VLDL: 16.4 mg/dL (ref 0.0–40.0)

## 2019-11-13 LAB — CBC
HCT: 35.5 % — ABNORMAL LOW (ref 36.0–46.0)
Hemoglobin: 11.4 g/dL — ABNORMAL LOW (ref 12.0–15.0)
MCHC: 32.2 g/dL (ref 30.0–36.0)
MCV: 85.2 fl (ref 78.0–100.0)
Platelets: 215 10*3/uL (ref 150.0–400.0)
RBC: 4.17 Mil/uL (ref 3.87–5.11)
RDW: 13.2 % (ref 11.5–15.5)
WBC: 8.2 10*3/uL (ref 4.0–10.5)

## 2019-11-20 ENCOUNTER — Telehealth: Payer: Self-pay

## 2019-11-20 NOTE — Telephone Encounter (Signed)
Pt was seen on 11/12/19 and was advised to try the metformin for 1 month to see if that will help the boils pt has 2 under her arm, one on back and one on buttock. The one on the buttock has pain level of 9 and pt said she is miserable and cannot wait 1 month; pt wants Clindamycin orally sent to [facility] [address]. Pt said is not draining but the one on the buttock is the most painful and feels hard. Pt cannot see if red or has pus in boil. Pt has no fever. Pt said she feels like sticking it to see if it will drain. I advised pt not to do that. Will send to DR Selena Batten as PCP who is out of office and D GEssner FNP who is in office. Pt does not want to have to go to UC since she was just seen on 11/12/19. Pt will ck with pharmacy in the next 1- 1 1/2 hrs to see if med sent in as requested.

## 2019-11-20 NOTE — Telephone Encounter (Signed)
Noted  

## 2019-11-20 NOTE — Telephone Encounter (Signed)
I spoke with D Leone Payor FNP; pt is using the Clindamycin topically and taking Metformin without relief; asked pt if she was using warm compresses 3-4 times daily or sitz bath; pt said she was in a hurry and did not want to be rude but needed the condensed version; was someone going to call oral Clindamycin in for pt. I advised D Leone Payor FNP was concerned that oral clindamycin could give pt a side effect of infectious diarrhea and could not call in the oral abx. Smoking makes this condition worse and if pt is not better or cannot tolerate pain pt should go to ED for eval and possible I&D. Pt said OK and ended call. FYI to Harlin Heys FNP and Dr Selena Batten.

## 2019-11-20 NOTE — Telephone Encounter (Signed)
After reviewing Dr. Elmyra Ricks note, still think the best course is to use topical clindamycin, stay on metformin, warm compress/ sitz baths. If severe pain persists, she should seek care from Urgent Care/ ER.

## 2019-11-27 ENCOUNTER — Encounter: Payer: Self-pay | Admitting: Family Medicine

## 2019-12-04 ENCOUNTER — Other Ambulatory Visit: Payer: Self-pay

## 2019-12-04 DIAGNOSIS — E119 Type 2 diabetes mellitus without complications: Secondary | ICD-10-CM | POA: Diagnosis not present

## 2019-12-04 DIAGNOSIS — L0231 Cutaneous abscess of buttock: Secondary | ICD-10-CM | POA: Diagnosis not present

## 2019-12-04 DIAGNOSIS — F17290 Nicotine dependence, other tobacco product, uncomplicated: Secondary | ICD-10-CM | POA: Diagnosis not present

## 2019-12-04 DIAGNOSIS — L732 Hidradenitis suppurativa: Secondary | ICD-10-CM | POA: Insufficient documentation

## 2019-12-04 DIAGNOSIS — Z7984 Long term (current) use of oral hypoglycemic drugs: Secondary | ICD-10-CM | POA: Insufficient documentation

## 2019-12-04 DIAGNOSIS — K644 Residual hemorrhoidal skin tags: Secondary | ICD-10-CM | POA: Insufficient documentation

## 2019-12-04 NOTE — ED Triage Notes (Signed)
Patient c/o boils to bilateral buttocks X 2 weeks. Patient reports possible Hemorid X 2 days. Patient reports putting antibiotic ointment on Hemorid.

## 2019-12-05 ENCOUNTER — Emergency Department
Admission: EM | Admit: 2019-12-05 | Discharge: 2019-12-05 | Disposition: A | Discharge status: Home / Self Care | Payer: BC Managed Care – PPO | Attending: Emergency Medicine | Admitting: Emergency Medicine

## 2019-12-05 DIAGNOSIS — K644 Residual hemorrhoidal skin tags: Secondary | ICD-10-CM

## 2019-12-05 DIAGNOSIS — L732 Hidradenitis suppurativa: Secondary | ICD-10-CM

## 2019-12-05 MED ORDER — CLINDAMYCIN HCL 150 MG PO CAPS
450.0000 mg | ORAL_CAPSULE | Freq: Once | ORAL | Status: AC
  Administered 2019-12-05: 450 mg via ORAL
  Filled 2019-12-05: qty 3

## 2019-12-05 MED ORDER — CLINDAMYCIN HCL 150 MG PO CAPS: 450.0000 mg | ORAL_CAPSULE | Freq: Three times a day (TID) | ORAL | 0 refills | Status: AC

## 2019-12-05 NOTE — ED Notes (Signed)
No peripheral IV placed this visit.   Discharge instructions reviewed with patient. Questions fielded by this RN. Patient verbalizes understanding of instructions. Patient discharged home in stable condition per [facility]bach. No acute distress noted at time of discharge.    Pt ambulatory to DC with sig other

## 2019-12-05 NOTE — Discharge Instructions (Addendum)
As we discussed, you are suffering from a hemorrhoid.  There is no immediate fix for them, but please read through the included information for recommendations.  Sitting in warm water (Sitz baths) can help a lot.  You can also use over-the-counter ointments and creams.  You should also consider trying a high-fiber diet.  Please fill any prescriptions you may have been provided and use them as recommended on the label instructions.  Follow-up with the doctor listed for an outpatient follow-up visit to discuss additional treatment options.  Remember that hemorrhoids can bleed and this is frequently normal, but if the bleeding becomes severe, you may need to return to the emergency department for evaluation.    However, given your hidradenitis and the other abscesses on your legs and buttocks, we agree that treating you with antibiotics makes sense.  I sent a prescription for clindamycin to your pharmacy and I encourage you to take the full course of treatment (3 capsules, 3 times a day for 10 days).  I also encourage you to call the office of Dr. Tonna Boehringer and schedule a follow-up appointment with him or one of his colleagues at the next available opportunity for additional evaluation.  It is possible to get hidradenitis of the anus and it would be a good idea to follow-up with a specialist.  Return to the emergency department if you develop new or worsening symptoms that concern you.

## 2019-12-05 NOTE — ED Provider Notes (Signed)
[facility] Emergency Department Provider Note  ____________________________________________   First MD Initiated Contact with Patient 12/05/19 [phone]     (approximate)  I have reviewed the triage vital signs and the nursing notes.   HISTORY  Chief Complaint Abscess and Hemorrhoids    HPI Haley [name] is a 34 y.o. female    with a history of hidradenitis who presents for evaluation of about 2 weeks of scattered abscesses on her buttocks and upper thighs and acute onset and severe swelling and pain around the anus which she thinks may be a hemorrhoid.  She has used antibiotics multiple times in the past and said that clindamycin works better than Bactrim or doxycycline.  She went to see her regular doctor about this small abscesses on her buttocks and thighs but the doctor declined to provide any antibiotics.  She said that they are getting worse.  She denies fever/chills, sore throat, chest pain, shortness of breath, nausea, vomiting, and abdominal pain.  She came in tonight because of the swelling and pain around her anus.  She has not had hemorrhoids before but this developed very quickly and suddenly over the last day or 2.  Nothing in particular makes it better or worse although when it first started it was smaller and she put some clindamycin ointment on it and then it became much bigger.        Past Medical History:  Diagnosis Date  . Diabetes mellitus   . Hidradenitis suppurativa   . Psychosis ([place]) 12/16/2018    Patient Active Problem List   Diagnosis Date Noted  . Type 2 diabetes mellitus without complication, without long-term current use of insulin ([place]) 11/12/2019  . Hidradenitis suppurativa 11/12/2019    Past Surgical History:  Procedure Laterality Date  . ROOT CANAL  05/17/2011    Prior to Admission medications   Medication Sig Start Date End Date Taking? Authorizing Provider  blood glucose meter kit and supplies Dispense based on  patient and insurance preference. Use up to four times daily as directed. (FOR ICD-10 E10.9, E11.9). 11/12/19   Lesleigh Noe, MD  clindamycin (CLEOCIN) 150 MG capsule Take 3 capsules (450 mg total) by mouth 3 (three) times daily for 10 days. 12/05/19 12/15/19  Hinda Kehr, MD  clindamycin (CLINDAGEL) 1 % gel Apply 1 application topically 2 (two) times daily. Apply to areas of concern 11/12/19   Lesleigh Noe, MD  glipiZIDE (GLUCOTROL) 5 MG tablet Take 1 tablet (5 mg total) by mouth 2 (two) times daily before a meal. 11/12/19   Lesleigh Noe, MD  metFORMIN (GLUCOPHAGE) 1000 MG tablet Take 1 tablet (1,000 mg total) by mouth 2 (two) times daily with a meal. 11/12/19   Lesleigh Noe, MD    Allergies Patient has no known allergies.  Family History  Problem Relation Age of Onset  . Drug abuse Mother   . Cancer Father        unknown type  . Kidney failure Father        s/p transplant    Social History Social History   Tobacco Use  . Smoking status: Current Every Day Smoker    Years: 15.00    Types: Cigars  . Smokeless tobacco: Never Used  . Tobacco comment: 1-2 cigars per day  Substance Use Topics  . Alcohol use: Yes    Comment: wine a few times a month  . Drug use: Not Currently    Comment: previous MJ  Review of Systems Constitutional: No fever/chills Respiratory: Denies shortness of breath. Gastrointestinal: Rectal pain and swelling concerning for hemorrhoid and/or infection.  Denies nausea, vomiting, diarrhea. Genitourinary: Negative for dysuria. Integumentary: History of hidradenitis with about 2 weeks of lesions on her buttocks and upper thighs. Neurological: Negative for headaches, focal weakness or numbness.   ____________________________________________   PHYSICAL EXAM:  VITAL SIGNS: ED Triage Vitals  Enc Vitals Group     BP 12/04/19 2215 (!) 152/88     Pulse Rate 12/04/19 2215 98     Resp 12/04/19 2215 18     Temp 12/04/19 2215 98.1 F (36.7 C)      Temp src --      SpO2 12/04/19 2215 95 %     Weight 12/04/19 2218 108.4 kg (239 lb)     Height 12/04/19 2218 1.651 m (_0 )     Head Circumference --      Peak Flow --      Pain Score 12/04/19 2217 10     Pain Loc --      Pain Edu? --      Excl. in [place]? --     Constitutional: Alert and oriented.  Eyes: Conjunctivae are normal.  Cardiovascular: Normal rate, regular rhythm. Good peripheral circulation. Respiratory: Normal respiratory effort.  No retractions. Gastrointestinal: Soft and nontender. No distention.  Rectal: There is a large external lesion that appears most consistent with a nonthrombosed, nonbleeding large external hemorrhoid.  It is tender and soft but nonfluctuant, not indurated, with no surrounding induration that would suggest a perirectal or perianal abscess.   Musculoskeletal: No lower extremity tenderness nor edema. No gross deformities of extremities. Neurologic:  Normal speech and language. No gross focal neurologic deficits are appreciated.  Skin:  Skin is warm, dry and intact.  She has old scars from prior abscesses.  On her buttocks and upper thighs there are multiple small areas of induration most consistent with small abscesses secondary to her hidradenitis. Psychiatric: Mood and affect are normal. Speech and behavior are normal.  ____________________________________________   LABS (all labs ordered are listed, but only abnormal results are displayed)  Labs Reviewed - No data to display ____________________________________________  EKG  No indication for emergent EKG ____________________________________________  RADIOLOGY I, Hinda Kehr, personally viewed and evaluated these images (plain radiographs) as part of my medical decision making, as well as reviewing the written report by the radiologist.  ED MD interpretation: No indication for emergent imaging  Official radiology report(s): No results  found.  ____________________________________________   PROCEDURES   Procedure(s) performed (including Critical Care):  Procedures   ____________________________________________   INITIAL IMPRESSION / MDM / ASSESSMENT AND PLAN / ED COURSE  As part of my medical decision making, I reviewed the following data within the [place] History obtained from family, Nursing notes reviewed and incorporated, Old chart reviewed, Notes from prior ED visits and [place] Controlled Substance Database   Differential diagnosis includes, but is not limited to, hidradenitis suppurativa, perianal abscess, perirectal abscess, perianal/perirectal fistula.  Patient's vital signs are stable and she is afebrile.  The acute onset of the anal lesion suggests a hemorrhoid to me and the physical exam further suggest this.  However she is at high risk of infection given her history of high denies suppurativa and I cannot completely rule out hydronidus of the anus at this time.  We talked about it at length and agreed to start her on a course of clindamycin 400 mg 3  times daily x10 days and I encouraged close follow-up with Dr. Lysle Pearl with general surgery.  I also sent Dr. Lysle Pearl a message through [facility] so that he would be aware of the patient and perhaps a staff could reach out to her to schedule a follow-up appointment.  At this time I do not think she would benefit from I&D of the perianal lesion because I think it is a hemorrhoid and any manipulation of acute cause much more harm than good, but I do think she would benefit from follow-up with a specialist.  She understands and agrees with the plan and I also gave her my usual and customary hemorrhoid and abscess management recommendations and return precautions.  She has a family member with her as well and we discussed the case with him so that he knows the plan too.           ____________________________________________  FINAL CLINICAL IMPRESSION(S) /  ED DIAGNOSES  Final diagnoses:  Hidradenitis  External hemorrhoid     MEDICATIONS GIVEN DURING THIS VISIT:  Medications  clindamycin (CLEOCIN) capsule 450 mg (has no administration in time range)     ED Discharge Orders         Ordered    clindamycin (CLEOCIN) 150 MG capsule  3 times daily     Discontinue  Reprint     12/05/19 0233          *Please note:  Haley [name] was evaluated in Emergency Department on 12/05/2019 for the symptoms described in the history of present illness. She was evaluated in the context of the global COVID-19 pandemic, which necessitated consideration that the patient might be at risk for infection with the SARS-CoV-2 virus that causes COVID-19. Institutional protocols and algorithms that pertain to the evaluation of patients at risk for COVID-19 are in a state of rapid change based on information released by regulatory bodies including the CDC and federal and state organizations. These policies and algorithms were followed during the patient's care in the ED.  Some ED evaluations and interventions may be delayed as a result of limited staffing during and after the pandemic.*  Note:  This document was prepared using Dragon voice recognition software and may include unintentional dictation errors.   Hinda Kehr, MD 12/05/19 [phone]

## 2019-12-05 NOTE — ED Notes (Signed)
Called patient for room with no answer. 

## 2019-12-16 ENCOUNTER — Ambulatory Visit: Payer: BC Managed Care – PPO | Admitting: Family Medicine

## 2020-01-02 DIAGNOSIS — L02412 Cutaneous abscess of left axilla: Secondary | ICD-10-CM | POA: Diagnosis not present

## 2020-01-02 DIAGNOSIS — L089 Local infection of the skin and subcutaneous tissue, unspecified: Secondary | ICD-10-CM | POA: Diagnosis not present

## 2020-01-03 DIAGNOSIS — Z5189 Encounter for other specified aftercare: Secondary | ICD-10-CM | POA: Diagnosis not present

## 2020-01-03 DIAGNOSIS — L0291 Cutaneous abscess, unspecified: Secondary | ICD-10-CM | POA: Diagnosis not present

## 2020-02-08 ENCOUNTER — Encounter (HOSPITAL_BASED_OUTPATIENT_CLINIC_OR_DEPARTMENT_OTHER): Payer: Self-pay

## 2020-02-08 ENCOUNTER — Other Ambulatory Visit: Payer: Self-pay

## 2020-02-08 ENCOUNTER — Inpatient Hospital Stay (HOSPITAL_BASED_OUTPATIENT_CLINIC_OR_DEPARTMENT_OTHER)
Admission: EM | Admit: 2020-02-08 | Discharge: 2020-02-16 | DRG: 854 | Disposition: A | Discharge status: Home Health | Payer: BC Managed Care – PPO | Attending: Internal Medicine | Admitting: Internal Medicine

## 2020-02-08 ENCOUNTER — Emergency Department (HOSPITAL_BASED_OUTPATIENT_CLINIC_OR_DEPARTMENT_OTHER): Payer: BC Managed Care – PPO

## 2020-02-08 DIAGNOSIS — L0390 Cellulitis, unspecified: Secondary | ICD-10-CM | POA: Diagnosis present

## 2020-02-08 DIAGNOSIS — Z20822 Contact with and (suspected) exposure to covid-19: Secondary | ICD-10-CM | POA: Diagnosis present

## 2020-02-08 DIAGNOSIS — D500 Iron deficiency anemia secondary to blood loss (chronic): Secondary | ICD-10-CM | POA: Diagnosis not present

## 2020-02-08 DIAGNOSIS — L02211 Cutaneous abscess of abdominal wall: Secondary | ICD-10-CM | POA: Diagnosis present

## 2020-02-08 DIAGNOSIS — E119 Type 2 diabetes mellitus without complications: Secondary | ICD-10-CM | POA: Diagnosis not present

## 2020-02-08 DIAGNOSIS — L732 Hidradenitis suppurativa: Secondary | ICD-10-CM | POA: Diagnosis present

## 2020-02-08 DIAGNOSIS — R6520 Severe sepsis without septic shock: Secondary | ICD-10-CM | POA: Diagnosis present

## 2020-02-08 DIAGNOSIS — Z9119 Patient's noncompliance with other medical treatment and regimen: Secondary | ICD-10-CM | POA: Diagnosis not present

## 2020-02-08 DIAGNOSIS — R079 Chest pain, unspecified: Secondary | ICD-10-CM | POA: Diagnosis not present

## 2020-02-08 DIAGNOSIS — E669 Obesity, unspecified: Secondary | ICD-10-CM | POA: Diagnosis not present

## 2020-02-08 DIAGNOSIS — A4101 Sepsis due to Methicillin susceptible Staphylococcus aureus: Secondary | ICD-10-CM | POA: Diagnosis not present

## 2020-02-08 DIAGNOSIS — E872 Acidosis, unspecified: Secondary | ICD-10-CM | POA: Diagnosis present

## 2020-02-08 DIAGNOSIS — A419 Sepsis, unspecified organism: Secondary | ICD-10-CM | POA: Diagnosis not present

## 2020-02-08 DIAGNOSIS — K5900 Constipation, unspecified: Secondary | ICD-10-CM | POA: Diagnosis not present

## 2020-02-08 DIAGNOSIS — E1165 Type 2 diabetes mellitus with hyperglycemia: Secondary | ICD-10-CM

## 2020-02-08 DIAGNOSIS — Z833 Family history of diabetes mellitus: Secondary | ICD-10-CM | POA: Diagnosis not present

## 2020-02-08 DIAGNOSIS — R739 Hyperglycemia, unspecified: Secondary | ICD-10-CM | POA: Diagnosis not present

## 2020-02-08 DIAGNOSIS — Z79899 Other long term (current) drug therapy: Secondary | ICD-10-CM

## 2020-02-08 DIAGNOSIS — Z7984 Long term (current) use of oral hypoglycemic drugs: Secondary | ICD-10-CM | POA: Diagnosis not present

## 2020-02-08 DIAGNOSIS — E8809 Other disorders of plasma-protein metabolism, not elsewhere classified: Secondary | ICD-10-CM | POA: Diagnosis present

## 2020-02-08 DIAGNOSIS — F419 Anxiety disorder, unspecified: Secondary | ICD-10-CM | POA: Diagnosis not present

## 2020-02-08 DIAGNOSIS — L03311 Cellulitis of abdominal wall: Secondary | ICD-10-CM | POA: Diagnosis not present

## 2020-02-08 DIAGNOSIS — Z9114 Patient's other noncompliance with medication regimen: Secondary | ICD-10-CM

## 2020-02-08 DIAGNOSIS — Z6839 Body mass index (BMI) 39.0-39.9, adult: Secondary | ICD-10-CM | POA: Diagnosis not present

## 2020-02-08 DIAGNOSIS — D638 Anemia in other chronic diseases classified elsewhere: Secondary | ICD-10-CM | POA: Diagnosis not present

## 2020-02-08 DIAGNOSIS — E1065 Type 1 diabetes mellitus with hyperglycemia: Secondary | ICD-10-CM | POA: Diagnosis present

## 2020-02-08 DIAGNOSIS — E876 Hypokalemia: Secondary | ICD-10-CM | POA: Diagnosis not present

## 2020-02-08 DIAGNOSIS — B373 Candidiasis of vulva and vagina: Secondary | ICD-10-CM | POA: Diagnosis present

## 2020-02-08 DIAGNOSIS — K760 Fatty (change of) liver, not elsewhere classified: Secondary | ICD-10-CM | POA: Diagnosis not present

## 2020-02-08 DIAGNOSIS — F17290 Nicotine dependence, other tobacco product, uncomplicated: Secondary | ICD-10-CM | POA: Diagnosis present

## 2020-02-08 DIAGNOSIS — S31609A Unspecified open wound of abdominal wall, unspecified quadrant with penetration into peritoneal cavity, initial encounter: Secondary | ICD-10-CM | POA: Diagnosis not present

## 2020-02-08 DIAGNOSIS — M726 Necrotizing fasciitis: Secondary | ICD-10-CM | POA: Diagnosis not present

## 2020-02-08 DIAGNOSIS — I96 Gangrene, not elsewhere classified: Secondary | ICD-10-CM | POA: Diagnosis not present

## 2020-02-08 LAB — COMPREHENSIVE METABOLIC PANEL
ALT: 17 U/L (ref 0–44)
ALT: 14 U/L (ref 0–44)
AST: 22 U/L (ref 15–41)
AST: 15 U/L (ref 15–41)
Albumin: 2.8 g/dL — ABNORMAL LOW (ref 3.5–5.0)
Albumin: 2.6 g/dL — ABNORMAL LOW (ref 3.5–5.0)
Alkaline Phosphatase: 85 U/L (ref 38–126)
Alkaline Phosphatase: 76 U/L (ref 38–126)
Anion gap: 12 (ref 5–15)
Anion gap: 13 (ref 5–15)
BUN: 7 mg/dL (ref 6–20)
BUN: <5 mg/dL — ABNORMAL LOW (ref 6–20)
CO2: 22 mmol/L (ref 22–32)
CO2: 20 mmol/L — ABNORMAL LOW (ref 22–32)
Calcium: 8.3 mg/dL — ABNORMAL LOW (ref 8.9–10.3)
Calcium: 8.2 mg/dL — ABNORMAL LOW (ref 8.9–10.3)
Chloride: 95 mmol/L — ABNORMAL LOW (ref 98–111)
Chloride: 99 mmol/L (ref 98–111)
Creatinine, Ser: 0.53 mg/dL (ref 0.44–1.00)
Creatinine, Ser: 0.46 mg/dL (ref 0.44–1.00)
GFR calc Af Amer: >60 mL/min (ref >60)
GFR calc Af Amer: >60 mL/min (ref >60)
GFR calc non Af Amer: >60 mL/min (ref >60)
GFR calc non Af Amer: >60 mL/min (ref >60)
Glucose, Bld: 411 mg/dL — ABNORMAL HIGH (ref 70–99)
Glucose, Bld: 253 mg/dL — ABNORMAL HIGH (ref 70–99)
Potassium: 3.4 mmol/L — ABNORMAL LOW (ref 3.5–5.1)
Potassium: 3.4 mmol/L — ABNORMAL LOW (ref 3.5–5.1)
Sodium: 129 mmol/L — ABNORMAL LOW (ref 135–145)
Sodium: 132 mmol/L — ABNORMAL LOW (ref 135–145)
Total Bilirubin: 1.2 mg/dL (ref 0.3–1.2)
Total Bilirubin: 1.4 mg/dL — ABNORMAL HIGH (ref 0.3–1.2)
Total Protein: 7.7 g/dL (ref 6.5–8.1)
Total Protein: 7.1 g/dL (ref 6.5–8.1)

## 2020-02-08 LAB — PREGNANCY, URINE: Preg Test, Ur: NEGATIVE

## 2020-02-08 LAB — URINALYSIS, ROUTINE W REFLEX MICROSCOPIC
Bilirubin Urine: NEGATIVE
Glucose, UA: >=500 mg/dL — AB
Hgb urine dipstick: NEGATIVE
Ketones, ur: NEGATIVE mg/dL
Leukocytes,Ua: NEGATIVE
Nitrite: NEGATIVE
Protein, ur: NEGATIVE mg/dL
Specific Gravity, Urine: <1.005 — ABNORMAL LOW (ref 1.005–1.030)
pH: 5.5 (ref 5.0–8.0)

## 2020-02-08 LAB — CBC WITH DIFFERENTIAL/PLATELET
Abs Immature Granulocytes: 0.11 10*3/uL — ABNORMAL HIGH (ref 0.00–0.07)
Abs Immature Granulocytes: 0.13 10*3/uL — ABNORMAL HIGH (ref 0.00–0.07)
Basophils Absolute: 0.1 10*3/uL (ref 0.0–0.1)
Basophils Absolute: 0.1 10*3/uL (ref 0.0–0.1)
Basophils Relative: 0 %
Basophils Relative: 0 %
Eosinophils Absolute: 0 10*3/uL (ref 0.0–0.5)
Eosinophils Absolute: 0 10*3/uL (ref 0.0–0.5)
Eosinophils Relative: 0 %
Eosinophils Relative: 0 %
HCT: 33.4 % — ABNORMAL LOW (ref 36.0–46.0)
HCT: 30.9 % — ABNORMAL LOW (ref 36.0–46.0)
Hemoglobin: 10.8 g/dL — ABNORMAL LOW (ref 12.0–15.0)
Hemoglobin: 9.9 g/dL — ABNORMAL LOW (ref 12.0–15.0)
Immature Granulocytes: 1 %
Immature Granulocytes: 1 %
Lymphocytes Relative: 10 %
Lymphocytes Relative: 10 %
Lymphs Abs: 1.6 10*3/uL (ref 0.7–4.0)
Lymphs Abs: 1.9 10*3/uL (ref 0.7–4.0)
MCH: 26.5 pg (ref 26.0–34.0)
MCH: 26.9 pg (ref 26.0–34.0)
MCHC: 32.3 g/dL (ref 30.0–36.0)
MCHC: 32 g/dL (ref 30.0–36.0)
MCV: 82.1 fL (ref 80.0–100.0)
MCV: 84 fL (ref 80.0–100.0)
Monocytes Absolute: 1 10*3/uL (ref 0.1–1.0)
Monocytes Absolute: 1.2 10*3/uL — ABNORMAL HIGH (ref 0.1–1.0)
Monocytes Relative: 6 %
Monocytes Relative: 7 %
Neutro Abs: 13.9 10*3/uL — ABNORMAL HIGH (ref 1.7–7.7)
Neutro Abs: 15.4 10*3/uL — ABNORMAL HIGH (ref 1.7–7.7)
Neutrophils Relative %: 83 %
Neutrophils Relative %: 82 %
Platelets: 187 10*3/uL (ref 150–400)
Platelets: 201 10*3/uL (ref 150–400)
RBC: 4.07 MIL/uL (ref 3.87–5.11)
RBC: 3.68 MIL/uL — ABNORMAL LOW (ref 3.87–5.11)
RDW: 12.4 % (ref 11.5–15.5)
RDW: 12.9 % (ref 11.5–15.5)
WBC: 16.7 10*3/uL — ABNORMAL HIGH (ref 4.0–10.5)
WBC: 18.8 10*3/uL — ABNORMAL HIGH (ref 4.0–10.5)
nRBC: 0 % (ref 0.0–0.2)
nRBC: 0 % (ref 0.0–0.2)

## 2020-02-08 LAB — CBG MONITORING, ED
Glucose-Capillary: 387 mg/dL — ABNORMAL HIGH (ref 70–99)
Glucose-Capillary: 276 mg/dL — ABNORMAL HIGH (ref 70–99)

## 2020-02-08 LAB — GLUCOSE, CAPILLARY: Glucose-Capillary: 260 mg/dL — ABNORMAL HIGH (ref 70–99)

## 2020-02-08 LAB — SARS CORONAVIRUS 2 BY RT PCR (HOSPITAL ORDER, PERFORMED IN ~~LOC~~ HOSPITAL LAB): SARS Coronavirus 2: NEGATIVE

## 2020-02-08 LAB — LACTIC ACID, PLASMA
Lactic Acid, Venous: 2.4 mmol/L (ref 0.5–1.9)
Lactic Acid, Venous: 1.3 mmol/L (ref 0.5–1.9)

## 2020-02-08 LAB — URINALYSIS, MICROSCOPIC (REFLEX)

## 2020-02-08 MED ORDER — HYDROMORPHONE HCL 1 MG/ML IJ SOLN
0.5000 mg | Freq: Once | INTRAMUSCULAR | Status: AC
  Administered 2020-02-08: 0.5 mg via INTRAVENOUS
  Filled 2020-02-08: qty 1

## 2020-02-08 MED ORDER — OXYCODONE HCL 5 MG PO TABS
5.0000 mg | ORAL_TABLET | ORAL | Status: DC | PRN
  Administered 2020-02-08 – 2020-02-11 (×4): 5 mg via ORAL
  Filled 2020-02-08 (×5): qty 1

## 2020-02-08 MED ORDER — INSULIN ASPART 100 UNIT/ML ~~LOC~~ SOLN
0.0000 [IU] | Freq: Three times a day (TID) | SUBCUTANEOUS | Status: DC
  Administered 2020-02-09 (×2): 5 [IU] via SUBCUTANEOUS
  Administered 2020-02-09: 3 [IU] via SUBCUTANEOUS
  Administered 2020-02-10: 15 [IU] via SUBCUTANEOUS
  Administered 2020-02-10 (×2): 8 [IU] via SUBCUTANEOUS
  Administered 2020-02-11: 3 [IU] via SUBCUTANEOUS
  Administered 2020-02-11: 5 [IU] via SUBCUTANEOUS
  Administered 2020-02-11 – 2020-02-13 (×4): 2 [IU] via SUBCUTANEOUS
  Administered 2020-02-13: 3 [IU] via SUBCUTANEOUS
  Administered 2020-02-13 – 2020-02-14 (×2): 2 [IU] via SUBCUTANEOUS
  Administered 2020-02-14: 5 [IU] via SUBCUTANEOUS
  Administered 2020-02-14 – 2020-02-15 (×3): 2 [IU] via SUBCUTANEOUS
  Administered 2020-02-15: 3 [IU] via SUBCUTANEOUS
  Administered 2020-02-16 (×2): 2 [IU] via SUBCUTANEOUS

## 2020-02-08 MED ORDER — SODIUM CHLORIDE 0.9 % IV BOLUS
1000.0000 mL | Freq: Once | INTRAVENOUS | Status: AC
  Administered 2020-02-08: 1000 mL via INTRAVENOUS

## 2020-02-08 MED ORDER — POTASSIUM CHLORIDE IN NACL 20-0.9 MEQ/L-% IV SOLN
INTRAVENOUS | Status: AC
  Filled 2020-02-08 (×2): qty 1000

## 2020-02-08 MED ORDER — PIPERACILLIN-TAZOBACTAM 3.375 G IVPB
3.3750 g | Freq: Three times a day (TID) | INTRAVENOUS | Status: DC
  Administered 2020-02-08 – 2020-02-09 (×3): 3.375 g via INTRAVENOUS
  Filled 2020-02-08 (×3): qty 50

## 2020-02-08 MED ORDER — ACETAMINOPHEN 650 MG RE SUPP: 650.0000 mg | Freq: Four times a day (QID) | RECTAL | Status: DC | PRN

## 2020-02-08 MED ORDER — IOHEXOL 300 MG/ML  SOLN
100.0000 mL | Freq: Once | INTRAMUSCULAR | Status: AC | PRN
  Administered 2020-02-08: 100 mL via INTRAVENOUS

## 2020-02-08 MED ORDER — CLINDAMYCIN PHOSPHATE 600 MG/50ML IV SOLN
600.0000 mg | Freq: Once | INTRAVENOUS | Status: AC
  Administered 2020-02-08: 600 mg via INTRAVENOUS
  Filled 2020-02-08: qty 50

## 2020-02-08 MED ORDER — INSULIN ASPART 100 UNIT/ML ~~LOC~~ SOLN
0.0000 [IU] | Freq: Every day | SUBCUTANEOUS | Status: DC
  Administered 2020-02-08: 3 [IU] via SUBCUTANEOUS
  Administered 2020-02-09: 4 [IU] via SUBCUTANEOUS
  Administered 2020-02-10: 5 [IU] via SUBCUTANEOUS
  Administered 2020-02-12: 2 [IU] via SUBCUTANEOUS

## 2020-02-08 MED ORDER — SODIUM CHLORIDE 0.9 % IV SOLN
INTRAVENOUS | Status: DC | PRN
  Administered 2020-02-08 – 2020-02-10 (×2): 500 mL via INTRAVENOUS

## 2020-02-08 MED ORDER — SENNOSIDES-DOCUSATE SODIUM 8.6-50 MG PO TABS: 1.0000 | ORAL_TABLET | Freq: Every evening | ORAL | Status: DC | PRN

## 2020-02-08 MED ORDER — VANCOMYCIN HCL IN DEXTROSE 1-5 GM/200ML-% IV SOLN
1000.0000 mg | Freq: Two times a day (BID) | INTRAVENOUS | Status: DC
  Administered 2020-02-08 – 2020-02-11 (×6): 1000 mg via INTRAVENOUS
  Filled 2020-02-08 (×6): qty 200

## 2020-02-08 MED ORDER — ENOXAPARIN SODIUM 60 MG/0.6ML ~~LOC~~ SOLN
55.0000 mg | SUBCUTANEOUS | Status: DC
  Administered 2020-02-08 – 2020-02-15 (×8): 55 mg via SUBCUTANEOUS
  Filled 2020-02-08 (×7): qty 0.6

## 2020-02-08 MED ORDER — ACETAMINOPHEN 325 MG PO TABS
650.0000 mg | ORAL_TABLET | Freq: Four times a day (QID) | ORAL | Status: DC | PRN
  Administered 2020-02-09: 650 mg via ORAL
  Filled 2020-02-08 (×2): qty 2

## 2020-02-08 MED ORDER — ONDANSETRON HCL 4 MG/2ML IJ SOLN
4.0000 mg | Freq: Once | INTRAMUSCULAR | Status: AC
  Administered 2020-02-08: 4 mg via INTRAVENOUS
  Filled 2020-02-08: qty 2

## 2020-02-08 NOTE — Progress Notes (Signed)
Zennie Ayars is a 34 year old female with past medical history of type 2 diabetes mellitus noncompliant with Metformin who presented to med [facility] with abdominal discomfort.  Patient noted a reddened area on her abdomen over the past few days, has been taking leftover doxycycline from a previous infection; but symptoms continue to worsen.  Referred for admission by ED provider PA Copeland  Temperature 100.3, HR 101, RR 26 BP 132/76, SPO2 98% on room air. Sodium 129, potassium 3.4, glucose 411, WBC count 16.7, lactic acid 2.3.  hCG negative.  CT abdomen/pelvis with a 5 x 11 x 7 cm right mid/lower anterior abdominal wall phlegmon with some gas.  Covid-19 test pending at time of transfer discussion.  Assessment/plan: Abdominal wall cellulitis versus necrotizing fasciitis:  Patient was started on vancomycin, Zosyn, clindamycin by ED provider.  PA Copeland to discuss with general surgery regarding CT findings concerning for possible necrotizing fasciitis, although per provider physical exam does not correlate with CT findings.  Accepted to medical/surgical bed.

## 2020-02-08 NOTE — ED Provider Notes (Signed)
[facility] EMERGENCY DEPARTMENT Provider Note   CSN: [id number] Arrival date & time: 02/08/20  1141     History Chief Complaint  Patient presents with  . Abscess    Haley [name] is a 34 y.o. female.  Patient with history of diabetes, noncompliant with Metformin, presents to the emergency department for developing abscess on right lower abdomen.  She first noticed symptoms 8 days ago.  Symptoms have gradually been worsening.  Patient states that she had leftover doxycycline at home for which she took in the past for a boil in her axilla.  She took about 4 days of this medication, last dose 2 days ago.  This did not seem to help.  Patient has had chills and a temperature of 100.3 degrees upon arrival to the emergency department.  She denies vomiting or diarrhea.  Onset of symptoms acute.  Nothing makes symptoms better or worse.  She has applied topical medications as well without improvement.        Past Medical History:  Diagnosis Date  . Diabetes mellitus   . Hidradenitis suppurativa   . Psychosis ([place]) 12/16/2018    Patient Active Problem List   Diagnosis Date Noted  . Type 2 diabetes mellitus without complication, without long-term current use of insulin ([place]) 11/12/2019  . Hidradenitis suppurativa 11/12/2019    Past Surgical History:  Procedure Laterality Date  . ROOT CANAL  05/17/2011     OB History   No obstetric history on file.     Family History  Problem Relation Age of Onset  . Drug abuse Mother   . Cancer Father        unknown type  . Kidney failure Father        s/p transplant    Social History   Tobacco Use  . Smoking status: Current Every Day Smoker    Years: 15.00    Types: Cigars  . Smokeless tobacco: Never Used  . Tobacco comment: 1-2 cigars per day  Substance Use Topics  . Alcohol use: Yes    Comment: wine a few times a month  . Drug use: Not Currently    Comment: previous MJ    Home Medications Prior to Admission  medications   Medication Sig Start Date End Date Taking? Authorizing Provider  blood glucose meter kit and supplies Dispense based on patient and insurance preference. Use up to four times daily as directed. (FOR ICD-10 E10.9, E11.9). 11/12/19   Lesleigh Noe, MD  clindamycin (CLINDAGEL) 1 % gel Apply 1 application topically 2 (two) times daily. Apply to areas of concern 11/12/19   Lesleigh Noe, MD  glipiZIDE (GLUCOTROL) 5 MG tablet Take 1 tablet (5 mg total) by mouth 2 (two) times daily before a meal. 11/12/19   Lesleigh Noe, MD  metFORMIN (GLUCOPHAGE) 1000 MG tablet Take 1 tablet (1,000 mg total) by mouth 2 (two) times daily with a meal. 11/12/19   Lesleigh Noe, MD    Allergies    Patient has no known allergies.  Review of Systems   Review of Systems  Constitutional: Positive for chills and fever.  HENT: Negative for rhinorrhea and sore throat.   Eyes: Negative for redness.  Respiratory: Negative for cough.   Cardiovascular: Negative for chest pain.  Gastrointestinal: Negative for abdominal pain, diarrhea, nausea and vomiting.  Genitourinary: Negative for dysuria, frequency, hematuria and urgency.  Musculoskeletal: Negative for myalgias.  Skin: Positive for color change. Negative for rash.  Neurological: Negative for  headaches.    Physical Exam Updated Vital Signs BP 129/82 (BP Location: Right Arm)   Pulse (!) 131   Temp 100.3 F (37.9 C) (Oral)   Resp 20   Ht $R'5\' 5"'Uq$  (1.651 m)   Wt 108.9 kg   LMP 01/25/2020   SpO2 99%   BMI 39.94 kg/m   Physical Exam Vitals and nursing note reviewed.  Constitutional:      General: She is not in acute distress.    Appearance: She is well-developed.  HENT:     Head: Normocephalic and atraumatic.     Right Ear: External ear normal.     Left Ear: External ear normal.     Nose: Nose normal.  Eyes:     Conjunctiva/sclera: Conjunctivae normal.  Cardiovascular:     Rate and Rhythm: Regular rhythm. Tachycardia present.     Heart  sounds: No murmur heard.   Pulmonary:     Effort: No respiratory distress.     Breath sounds: No wheezing, rhonchi or rales.  Abdominal:     Palpations: Abdomen is soft.     Tenderness: There is abdominal tenderness. There is no guarding or rebound.  Musculoskeletal:     Cervical back: Normal range of motion and neck supple.     Right lower leg: No edema.     Left lower leg: No edema.  Skin:    General: Skin is warm and dry.     Findings: No rash.     Comments: 10cm x 8cm area of induration to the right lower abdominal wall, with surrounding erythema consistent with cellulitis.  Area is tender and patient winces with palpation.  No crepitus.  No active drainage.  Neurological:     General: No focal deficit present.     Mental Status: She is alert. Mental status is at baseline.     Motor: No weakness.  Psychiatric:        Mood and Affect: Mood normal.         ED Results / Procedures / Treatments   Labs (all labs ordered are listed, but only abnormal results are displayed) Labs Reviewed  LACTIC ACID, PLASMA - Abnormal; Notable for the following components:      Result Value   Lactic Acid, Venous 2.4 (*)    All other components within normal limits  COMPREHENSIVE METABOLIC PANEL - Abnormal; Notable for the following components:   Sodium 129 (*)    Potassium 3.4 (*)    Chloride 95 (*)    Glucose, Bld 411 (*)    Calcium 8.3 (*)    Albumin 2.8 (*)    All other components within normal limits  CBC WITH DIFFERENTIAL/PLATELET - Abnormal; Notable for the following components:   WBC 16.7 (*)    Hemoglobin 10.8 (*)    HCT 33.4 (*)    Neutro Abs 13.9 (*)    Abs Immature Granulocytes 0.11 (*)    All other components within normal limits  URINALYSIS, ROUTINE W REFLEX MICROSCOPIC - Abnormal; Notable for the following components:   Specific Gravity, Urine <1.005 (*)    Glucose, UA >=500 (*)    All other components within normal limits  URINALYSIS, MICROSCOPIC (REFLEX) -  Abnormal; Notable for the following components:   Bacteria, UA FEW (*)    All other components within normal limits  CBG MONITORING, ED - Abnormal; Notable for the following components:   Glucose-Capillary 387 (*)    All other components within normal limits  SARS CORONAVIRUS  2 BY RT PCR (HOSPITAL ORDER, [facility])  CULTURE, BLOOD (ROUTINE X 2)  CULTURE, BLOOD (ROUTINE X 2)  LACTIC ACID, PLASMA  PREGNANCY, URINE    EKG None  Radiology CT ABDOMEN PELVIS W CONTRAST  Result Date: 02/08/2020 CLINICAL DATA:  Patient reports abscess to lower abdomen since Monday. Diabetes, area is red and swollen. EXAM: CT ABDOMEN AND PELVIS WITH CONTRAST TECHNIQUE: Multidetector CT imaging of the abdomen and pelvis was performed using the standard protocol following bolus administration of intravenous contrast. CONTRAST:  135mL OMNIPAQUE IOHEXOL 300 MG/ML  SOLN COMPARISON:  Ultrasound abdomen 12/29/2012 FINDINGS: Lower chest: Linear atelectasis versus scarring within the left lower lobe. Pulmonary micronodule within the lingula (2:4). Pulmonary micronodule within the right lower lobe (2:9). Otherwise no acute abnormality. Hepatobiliary: Diffusely hypodense hepatic parenchyma compared to the splenic parenchyma suggestive of hepatic steatosis. Layering hyperdensity within the gallbladder lumen suggestive of sludge versus tiny gallstones. No biliary ductal dilatation. Pancreas: Unremarkable. No pancreatic ductal dilatation or surrounding inflammatory changes. Spleen: Normal in size without focal abnormality. A splenule is identified. Adrenals/Urinary Tract: No adrenal nodule bilaterally. Bilateral kidneys enhance symmetrically. No hydronephrosis. No hydroureter. The urinary bladder is unremarkable. Stomach/Bowel: Stomach is within normal limits. Appendix appears normal. No evidence of bowel wall thickening, distention, or inflammatory changes. Vascular/Lymphatic: No abdominal aorta or iliac  aneurysm. No abdominal, pelvic, or inguinal lymphadenopathy. Asymmetrically prominent but nonenlarged inguinal lymph nodes on the right measuring up to 1.1 cm (2:85) that are likely reactive in etiology. Reproductive: Uterus and bilateral adnexa are unremarkable. Other: No intraperitoneal free fluid. No intraperitoneal free gas. No organized fluid collection. Musculoskeletal: Anterior mid to lower abdominal wall subcutaneous soft tissue edema, right greater than left with no organized fluid collection but a region of soft tissue density representing phlegmon formation measuring up to 5 x 11 x 7 cm (2:65, 5:10). Several superficial foci of subcutaneous soft tissue gas are associated with this area ( 2:59-66). No suspicious lytic or blastic osseous lesions. No acute displaced fracture. IMPRESSION: A 5 x 11 x 7 cm right mid to lower anterior abdominal wall phlegmon formation with several associated foci of subcutaneous soft tissue gas. No drainable organized fluid collection. No intra-abdominal extension. Given findings and patient history, please note that a necrotizing fasciitis is not excluded as this is a clinical diagnosis. No intra-abdominal or intrapelvic abnormality. Electronically Signed   By: Iven Finn M.D.   On: 02/08/2020 16:05    Procedures Procedures (including critical care time)  Medications Ordered in ED Medications  vancomycin (VANCOCIN) IVPB 1000 mg/200 mL premix (1,000 mg Intravenous New Bag/Given 02/08/20 1623)  clindamycin (CLEOCIN) IVPB 600 mg (has no administration in time range)  piperacillin-tazobactam (ZOSYN) IVPB 3.375 g (has no administration in time range)  sodium chloride 0.9 % bolus 1,000 mL (1,000 mLs Intravenous New Bag/Given 02/08/20 1523)  HYDROmorphone (DILAUDID) injection 0.5 mg (0.5 mg Intravenous Given 02/08/20 1524)  ondansetron (ZOFRAN) injection 4 mg (4 mg Intravenous Given 02/08/20 1524)  iohexol (OMNIPAQUE) 300 MG/ML solution 100 mL (100 mLs Intravenous  Contrast Given 02/08/20 1530)    ED Course  I have reviewed the triage vital signs and the nursing notes.  Pertinent labs & imaging results that were available during my care of the patient were reviewed by me and considered in my medical decision making (see chart for details).  Patient seen and examined. Work-up initiated. Medications ordered.   Vital signs reviewed and are as follows: BP 129/82 (BP Location: Right  Arm)   Pulse (!) 131   Temp 100.3 F (37.9 C) (Oral)   Resp 20   Ht 5' 5" (1.651 m)   Wt 108.9 kg   LMP 01/25/2020   SpO2 99%   BMI 39.94 kg/m   3:12 PM delay in obtaining imaging and treatment given no venous access.    I was able to place 20-gauge IV in right upper extremity under ultrasound guidance.  2nd lactate obtained.   EMERGENCY DEPARTMENT  US GUIDANCE EXAM Emergency Ultrasound:  US Guidance for Needle Guidance  INDICATIONS: Difficult vascular access Linear probe used in real-time to visualize location of needle entry through skin.   PERFORMED BY: Myself IMAGES ARCHIVED?: No LIMITATIONS: Pain VIEWS USED: Transverse INTERPRETATION: Needle visualized within vein and Right arm  4:15 PM CT reviewed. Pt updated. She looks well -- will send cultures and start abx. Will need admit.   4:46 PM Spoke with Dr. [place] who will place admission orders for patient.   Spoke with Dr. Dema Severin of general surgery. Based on clinical history -- likely MRSA over necrotizing fascitis. Agrees with IV abx.  Will await bed at [facility].       MDM Rules/Calculators/A&P                          Admit.    Final Clinical Impression(s) / ED Diagnoses Final diagnoses:  Cellulitis of right abdominal wall    Rx / DC Orders ED Discharge Orders    None       Carlisle Cater, PA-C 02/08/20 1650    Truddie Hidden, MD 02/08/20 1711

## 2020-02-08 NOTE — ED Notes (Signed)
Report given to Amanda RN with [facility] 

## 2020-02-08 NOTE — Progress Notes (Deleted)
Patient just arrived from [facility] and admissions was paged Patient's temp is 101.3, making her a yellow mews, protocol started.

## 2020-02-08 NOTE — ED Notes (Signed)
Pt CBG 387, reports drinking Coke PTA, pt also endorses being noncompliant with her metformin r/t diarrhea.

## 2020-02-08 NOTE — ED Notes (Signed)
PT Urine Cultured

## 2020-02-08 NOTE — ED Notes (Signed)
Report given to [facility]. All belonging kept with pt

## 2020-02-08 NOTE — ED Triage Notes (Signed)
Pt reports abscess to lower abdomen since Monday. Pt reports she is a diabatic, area is red and swollen.

## 2020-02-08 NOTE — Consult Note (Signed)
[facility] Surgery Consult Note  Haley [name] 1985-11-12  [id number].    Requesting MD:  Chief Complaint: abdominal wall abscess Reason for Consult: same  HPI:  34 y/o with hx of poorly controlled diabetes presents to ED at [facility] with lower abdominal wall area of redness and swelling that started 8 days ago, site has become worse, she had some left over antibiotics from prior infection and has been taking Doxycycline. She also has applied some topical antibiotics as well without improvement of her symptoms.  She presented to the ED with a low grade fever at home 100.3, mild tachycardia, BP stable.Glucose 411, Na 129, albumin 2.4, lactate 2.4.  WBC 16.7, H/H 10.8/33.4, platelets 187K, negative HCG, Covid negative, UA show glucose > 500.  CT shows a "5 x 11 x 7 cm right mid to lower anterior abdominal wall phlegmon formation with several associated foci of subcutaneous soft tissue gas. No drainable organized fluid collection. No intra-abdominal extension."  She was admitted by Medicine and transferred to [facility],  [place] last PM up to 101.3, tachycardic, with otherwise stable vital signs.Glucose 250 range, WBC18.8. Blood cultures pending. The site started draining this AM.  She has never had anything like this before.  Drainage is feculent smelling and [place] in color.    ROS: Review of Systems  Constitutional: Positive for fever. Negative for weight loss.  Eyes: Negative.   Respiratory: Negative.   Cardiovascular: Negative.   Gastrointestinal: Positive for abdominal pain (abdomen is tender).  Genitourinary: Negative.   Musculoskeletal: Negative.   Skin:       Started as a boil  Neurological: Negative.   Endo/Heme/Allergies: Negative.   Psychiatric/Behavioral: Negative.     Family History  Problem Relation Age of Onset   Drug abuse Mother    Cancer Father        unknown type   Kidney failure Father        s/p transplant    Past Medical History:  Diagnosis Date   Diabetes  mellitus    Hidradenitis suppurativa    Psychosis ([place]) 12/16/2018    Past Surgical History:  Procedure Laterality Date   ROOT CANAL  05/17/2011    Social History:  reports that she has been smoking cigars. She has smoked for the past 15.00 years. She has never used smokeless tobacco. She reports current alcohol use. She reports previous drug use.  Allergies: No Known Allergies  Prior to Admission medications   Medication Sig Start Date End Date Taking? Authorizing Provider  blood glucose meter kit and supplies Dispense based on patient and insurance preference. Use up to four times daily as directed. (FOR ICD-10 E10.9, E11.9). 11/12/19   Lesleigh Noe, MD  clindamycin (CLINDAGEL) 1 % gel Apply 1 application topically 2 (two) times daily. Apply to areas of concern 11/12/19   Lesleigh Noe, MD  glipiZIDE (GLUCOTROL) 5 MG tablet Take 1 tablet (5 mg total) by mouth 2 (two) times daily before a meal. 11/12/19   Lesleigh Noe, MD  metFORMIN (GLUCOPHAGE) 1000 MG tablet Take 1 tablet (1,000 mg total) by mouth 2 (two) times daily with a meal. 11/12/19   Lesleigh Noe, MD   5 x 9 x 5 cm abscess cavity draining Mayr feculent fluid currently.    Blood pressure 132/76, pulse (!) 101, temperature 98.3 F (36.8 C), temperature source Oral, resp. rate (!) 26, height _0  (1.651 m), weight 108.9 kg, last menstrual period 01/25/2020, SpO2 99 %. Physical Exam:  General:  pleasant, obese AA female who is laying in bed in NAD, abdominal site started draining this AM. HEENT: head is normocephalic, atraumatic.  Sclera are noninjected.  Pupils are equal.  Ears and nose without any masses or lesions.  Mouth is pink and moist Heart: regular, rate, and rhythm.  Normal s1,s2. No obvious murmurs, gallops, or rubs noted.  Palpable radial and pedal pulses bilaterally Lungs: CTAB, no wheezes, rhonchi, or rales noted.  Respiratory effort nonlabored Abd: soft, tender to palpation on the right, not so much on  the left, right sided abscess with [place] feculent smelling drainage measure 5 x 9 cm on the surface and is at least 5 cm deep after exploration with culture stick.  MS: all 4 extremities are symmetrical with no cyanosis, clubbing, or edema. Skin: warm and dry with no masses, lesions, or rashes Neuro: Cranial nerves 2-12 grossly intact, sensation is normal throughout Psych: A&Ox3 with an appropriate affect.     Results for orders placed or performed during the hospital encounter of 02/08/20 (from the past 48 hour(s))  CBG monitoring, ED     Status: Abnormal   Collection Time: 02/08/20 12:27 PM  Result Value Ref Range   Glucose-Capillary 387 (H) 70 - 99 mg/dL    Comment: Glucose reference range applies only to samples taken after fasting for at least 8 hours.  Lactic acid, plasma     Status: Abnormal   Collection Time: 02/08/20 12:50 PM  Result Value Ref Range   Lactic Acid, Venous 2.4 (HH) 0.5 - 1.9 mmol/L    Comment: CRITICAL RESULT CALLED TO, READ BACK BY AND VERIFIED WITH: SIMMS MARVA, RN @ [id number] ON 02/08/2020, [email or web address] Performed at [facility], [address] [place], [place] [address]   Comprehensive metabolic panel     Status: Abnormal   Collection Time: 02/08/20 12:50 PM  Result Value Ref Range   Sodium 129 (L) 135 - 145 mmol/L   Potassium 3.4 (L) 3.5 - 5.1 mmol/L   Chloride 95 (L) 98 - 111 mmol/L   CO2 22 22 - 32 mmol/L   Glucose, Bld 411 (H) 70 - 99 mg/dL    Comment: Glucose reference range applies only to samples taken after fasting for at least 8 hours.   BUN 7 6 - 20 mg/dL   Creatinine, Ser 0.53 0.44 - 1.00 mg/dL   Calcium 8.3 (L) 8.9 - 10.3 mg/dL   Total Protein 7.7 6.5 - 8.1 g/dL   Albumin 2.8 (L) 3.5 - 5.0 g/dL   AST 22 15 - 41 U/L   ALT 17 0 - 44 U/L   Alkaline Phosphatase 85 38 - 126 U/L   Total Bilirubin 1.2 0.3 - 1.2 mg/dL   GFR calc non Af Amer >60 >60 mL/min   GFR calc Af Amer >60 >60 mL/min   Anion gap 12 5 - 15    Comment: Performed at  [facility], [address] [place], [place] [address]  CBC with Differential     Status: Abnormal   Collection Time: 02/08/20 12:50 PM  Result Value Ref Range   WBC 16.7 (H) 4.0 - 10.5 K/uL   RBC 4.07 3.87 - 5.11 MIL/uL   Hemoglobin 10.8 (L) 12.0 - 15.0 g/dL   HCT 33.4 (L) 36 - 46 %   MCV 82.1 80.0 - 100.0 fL   MCH 26.5 26.0 - 34.0 pg   MCHC 32.3 30.0 - 36.0 g/dL   RDW 12.4 11.5 - 15.5 %  Platelets 187 150 - 400 K/uL   nRBC 0.0 0.0 - 0.2 %   Neutrophils Relative % 83 %   Neutro Abs 13.9 (H) 1.7 - 7.7 K/uL   Lymphocytes Relative 10 %   Lymphs Abs 1.6 0.7 - 4.0 K/uL   Monocytes Relative 6 %   Monocytes Absolute 1.0 0 - 1 K/uL   Eosinophils Relative 0 %   Eosinophils Absolute 0.0 0 - 0 K/uL   Basophils Relative 0 %   Basophils Absolute 0.1 0 - 0 K/uL   Immature Granulocytes 1 %   Abs Immature Granulocytes 0.11 (H) 0.00 - 0.07 K/uL    Comment: Performed at [facility], [address] [place], [place] [address]  Urinalysis, Routine w reflex microscopic Urine, Clean Catch     Status: Abnormal   Collection Time: 02/08/20 12:50 PM  Result Value Ref Range   Color, Urine YELLOW YELLOW   APPearance CLEAR CLEAR   Specific Gravity, Urine <1.005 (L) 1.005 - 1.030   pH 5.5 5.0 - 8.0   Glucose, UA >=500 (A) NEGATIVE mg/dL   Hgb urine dipstick NEGATIVE NEGATIVE   Bilirubin Urine NEGATIVE NEGATIVE   Ketones, ur NEGATIVE NEGATIVE mg/dL   Protein, ur NEGATIVE NEGATIVE mg/dL   Nitrite NEGATIVE NEGATIVE   Leukocytes,Ua NEGATIVE NEGATIVE    Comment: Performed at [facility], [address] [place], [place] [address]  Pregnancy, urine     Status: None   Collection Time: 02/08/20 12:50 PM  Result Value Ref Range   Preg Test, Ur NEGATIVE NEGATIVE    Comment:        THE SENSITIVITY OF THIS METHODOLOGY IS >20 mIU/mL. Performed at [facility], [address] [place], [place] [address]   Urinalysis, Microscopic (reflex)     Status: Abnormal    Collection Time: 02/08/20 12:50 PM  Result Value Ref Range   RBC / HPF 0-5 0 - 5 RBC/hpf   WBC, UA 0-5 0 - 5 WBC/hpf   Bacteria, UA FEW (A) NONE SEEN   Squamous Epithelial / LPF 0-5 0 - 5    Comment: Performed at [facility], [address] [place], [place] [address]  Lactic acid, plasma     Status: None   Collection Time: 02/08/20  3:14 PM  Result Value Ref Range   Lactic Acid, Venous 1.3 0.5 - 1.9 mmol/L    Comment: Performed at [facility], [address] [place], [place] [address]  SARS Coronavirus 2 by RT PCR (hospital order, performed in [facility] hospital lab) Nasopharyngeal Nasopharyngeal Swab     Status: None   Collection Time: 02/08/20  3:54 PM   Specimen: Nasopharyngeal Swab  Result Value Ref Range   SARS Coronavirus 2 NEGATIVE NEGATIVE    Comment: (NOTE) SARS-CoV-2 target nucleic acids are NOT DETECTED.  The SARS-CoV-2 RNA is generally detectable in upper and lower respiratory specimens during the acute phase of infection. The lowest concentration of SARS-CoV-2 viral copies this assay can detect is 250 copies / mL. A negative result does not preclude SARS-CoV-2 infection and should not be used as the sole basis for treatment or other patient management decisions.  A negative result may occur with improper specimen collection / handling, submission of specimen other than nasopharyngeal swab, presence of viral mutation(s) within the areas targeted by this assay, and inadequate number of viral copies (<250 copies / mL). A negative result must be  combined with clinical observations, patient history, and epidemiological information.  Fact Sheet for Patients:   StrictlyIdeas.no  Fact Sheet for Healthcare Providers: BankingDealers.co.za  This test is not yet approved or  cleared by the [place] FDA and has been authorized for detection and/or diagnosis of SARS-CoV-2 by FDA under an Emergency  Use Authorization (EUA).  This EUA will remain in effect (meaning this test can be used) for the duration of the COVID-19 declaration under Section 564(b)(1) of the Act, 21 U.S.C. section 360bbb-3(b)(1), unless the authorization is terminated or revoked sooner.  Performed at [facility], [address] [place], [place] [address]    CT ABDOMEN PELVIS W CONTRAST  Result Date: 02/08/2020 CLINICAL DATA:  Patient reports abscess to lower abdomen since Monday. Diabetes, area is red and swollen. EXAM: CT ABDOMEN AND PELVIS WITH CONTRAST TECHNIQUE: Multidetector CT imaging of the abdomen and pelvis was performed using the standard protocol following bolus administration of intravenous contrast. CONTRAST:  1104m OMNIPAQUE IOHEXOL 300 MG/ML  SOLN COMPARISON:  Ultrasound abdomen 12/29/2012 FINDINGS: Lower chest: Linear atelectasis versus scarring within the left lower lobe. Pulmonary micronodule within the lingula (2:4). Pulmonary micronodule within the right lower lobe (2:9). Otherwise no acute abnormality. Hepatobiliary: Diffusely hypodense hepatic parenchyma compared to the splenic parenchyma suggestive of hepatic steatosis. Layering hyperdensity within the gallbladder lumen suggestive of sludge versus tiny gallstones. No biliary ductal dilatation. Pancreas: Unremarkable. No pancreatic ductal dilatation or surrounding inflammatory changes. Spleen: Normal in size without focal abnormality. A splenule is identified. Adrenals/Urinary Tract: No adrenal nodule bilaterally. Bilateral kidneys enhance symmetrically. No hydronephrosis. No hydroureter. The urinary bladder is unremarkable. Stomach/Bowel: Stomach is within normal limits. Appendix appears normal. No evidence of bowel wall thickening, distention, or inflammatory changes. Vascular/Lymphatic: No abdominal aorta or iliac aneurysm. No abdominal, pelvic, or inguinal lymphadenopathy. Asymmetrically prominent but nonenlarged inguinal lymph nodes on the  right measuring up to 1.1 cm (2:85) that are likely reactive in etiology. Reproductive: Uterus and bilateral adnexa are unremarkable. Other: No intraperitoneal free fluid. No intraperitoneal free gas. No organized fluid collection. Musculoskeletal: Anterior mid to lower abdominal wall subcutaneous soft tissue edema, right greater than left with no organized fluid collection but a region of soft tissue density representing phlegmon formation measuring up to 5 x 11 x 7 cm (2:65, 5:10). Several superficial foci of subcutaneous soft tissue gas are associated with this area ( 2:59-66). No suspicious lytic or blastic osseous lesions. No acute displaced fracture. IMPRESSION: A 5 x 11 x 7 cm right mid to lower anterior abdominal wall phlegmon formation with several associated foci of subcutaneous soft tissue gas. No drainable organized fluid collection. No intra-abdominal extension. Given findings and patient history, please note that a necrotizing fasciitis is not excluded as this is a clinical diagnosis. No intra-abdominal or intrapelvic abnormality. Electronically Signed   By: MIven FinnM.D.   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  I am concerned she has a fistula.  I have cultured the site.  I will  make her NPO, and tentatively plan to take to the OR later today to open the abscess and better evaluate in the [place], [facility] Surgery 02/08/2020, 4:53 PM Please see Amion for pager number during day hours 7:00am-4:30pm

## 2020-02-08 NOTE — H&P (Signed)
History and Physical    Haley [name] JKD:[id number] DOB: 22-Nov-1985 DOA: 02/08/2020  PCP: Lesleigh Noe, MD    Patient coming from: Home  I have personally briefly reviewed patient's old medical records in [place]  Chief Complaint: transfer from [facility] for cellulitis.   HPI: Haley [name] is a 34 y.o. female with DM noncompliant with metformin, h/o hidradenitis suppurativa, bipolar disorder presented to [facility] 02/08/20 with developing worsening boil and swelling on right lower abdomen.   Patient reports that she first noticed a boil in her right lower abdomen almost a week ago.  She used leftover doxycycline 100 mg twice daily for 4 days without any significant improvement in boil that kept growing in size with surrounding redness and swelling. Also tried "boil ease", "draining salve"that she bought through [employer] without any significant improvement.  Been using OTC lidocaine for pain relief.  She now reports fever chills and generalized malaise.  She denies any history of MRSA infection reports that last skin infection was in her left axilla that was treated with Keflex in July 2021.  No vomiting or diarrhea.   She is currently single without any children.  Smokes 2 cigars daily for past 15 years.  Denies any drinking or recreational drug use.  She currently works at [employer].  Family history significant for mother with DM and CHF.  Dad also has diabetes.  Otherwise unremarkable.   ED Course: 129/82, pulse 131, RR 20, SPO2 99% Temp 100.3 F  Lactic acid 2.4--1.3 Sodium 139, potassium 3.4 Albumin 2.8 WBC 16.7, Hb 10.8 Code PCR negative Urine pregnancy negative Urinalysis significant glycosuria but no evidence of UTI  CT abdomen with 5X 11 X 7 cm right mid to lower anterior abdominal wall phlegmon formation with several associated foci of subcutaneous soft tissue gas.  No drainable organized fluid collection noted.  No intra-abdominal extension.  Possibility of necrotizing  fasciitis cannot be ruled out.   ED meds: Vancomycin 1 g Clindamycin 600 mg Zosyn 3.375 mg Dilaudid 0.5 IV once Zofran 4 mg IV once.  Review of Systems: As per HPI otherwise 10 point review of systems negative.   Review of Systems  Constitutional: Positive for chills, fever and malaise/fatigue. Negative for diaphoresis and weight loss.  HENT: Negative.   Respiratory: Negative.   Gastrointestinal: Negative.   Genitourinary: Negative.   Musculoskeletal: Positive for myalgias. Negative for back pain, falls, joint pain and neck pain.  Skin: Negative.   Neurological: Negative.   Endo/Heme/Allergies: Negative.   Psychiatric/Behavioral: Positive for depression.     Past Medical History:  Diagnosis Date  . Diabetes mellitus   . Hidradenitis suppurativa   . Psychosis ([place]) 12/16/2018    Past Surgical History:  Procedure Laterality Date  . ROOT CANAL  05/17/2011     reports that she has been smoking cigars. She has smoked for the past 15.00 years. She has never used smokeless tobacco. She reports current alcohol use. She reports previous drug use.  No Known Allergies  Family History  Problem Relation Age of Onset  . Drug abuse Mother   . Cancer Father        unknown type  . Kidney failure Father        s/p transplant    Prior to Admission medications   Medication Sig Start Date End Date Taking? Authorizing Provider  glipiZIDE (GLUCOTROL) 5 MG tablet Take 1 tablet (5 mg total) by mouth 2 (two) times daily before a meal. 11/12/19  Yes Waunita Schooner  R, MD  metFORMIN (GLUCOPHAGE) 1000 MG tablet Take 1 tablet (1,000 mg total) by mouth 2 (two) times daily with a meal. 11/12/19  Yes Lesleigh Noe, MD  blood glucose meter kit and supplies Dispense based on patient and insurance preference. Use up to four times daily as directed. (FOR ICD-10 E10.9, E11.9). 11/12/19   Lesleigh Noe, MD  clindamycin (CLINDAGEL) 1 % gel Apply 1 application topically 2 (two) times daily. Apply to  areas of concern 11/12/19   Lesleigh Noe, MD    Physical Exam: Vitals:   02/08/20 1925 02/08/20 1959 02/08/20 2051 02/08/20 2147  BP: 132/69  (!) 141/87 138/82  Pulse: (!) 105  100 100  Resp: _0 Temp:  100.1 F (37.8 C)  (!) 101.3 F (38.5 C)  TempSrc:  Oral  Oral  SpO2: 100%  100% 96%  Weight:      Height:        Constitutional: NAD, calm, comfortable Vitals:   02/08/20 1925 02/08/20 1959 02/08/20 2051 02/08/20 2147  BP: 132/69  (!) 141/87 138/82  Pulse: (!) 105  100 100  Resp: _1 Temp:  100.1 F (37.8 C)  (!) 101.3 F (38.5 C)  TempSrc:  Oral  Oral  SpO2: 100%  100% 96%  Weight:      Height:       Eyes: PERRL, lids and conjunctivae normal ENMT: Mucous membranes are moist. Posterior pharynx clear of any exudate or lesions.Normal dentition.  Neck: normal, supple, no masses, no thyromegaly Respiratory: clear to auscultation bilaterally, no wheezing, no crackles. Normal respiratory effort. No accessory muscle use.  Cardiovascular: Tachycardic with regular rate and rhythm, no murmurs / rubs / gallops. No extremity edema. 2+ pedal pulses. No carotid bruits.  Abdomen: Significant area of redness and induration in right lower quadrant.   Areas of spotty serosanguineous discharge.  No crepitus felt with palpation.    Musculoskeletal: no clubbing / cyanosis. No joint deformity upper and lower extremities. Good ROM, no contractures. Normal muscle tone.  Skin: no rashes, lesions, ulcers. No induration Neurologic: CN 2-12 grossly intact. Sensation intact, DTR normal. Strength 5/5 in all 4.  Psychiatric: Normal judgment and insight. Alert and oriented x 3. Normal mood.   Labs on Admission: I have personally reviewed following labs and imaging studies  CBC: Recent Labs  Lab 02/08/20 1250  WBC 16.7*  NEUTROABS 13.9*  HGB 10.8*  HCT 33.4*  MCV 82.1  PLT 003   Basic Metabolic Panel: Recent Labs  Lab 02/08/20 1250  NA 129*  K 3.4*  CL 95*  CO2 22   GLUCOSE 411*  BUN 7  CREATININE 0.53  CALCIUM 8.3*   GFR: Estimated Creatinine Clearance: 119.4 mL/min (by C-G formula based on SCr of 0.53 mg/dL). Liver Function Tests: Recent Labs  Lab 02/08/20 1250  AST 22  ALT 17  ALKPHOS 85  BILITOT 1.2  PROT 7.7  ALBUMIN 2.8*   No results for input(s): LIPASE, AMYLASE in the last 168 hours. No results for input(s): AMMONIA in the last 168 hours. Coagulation Profile: No results for input(s): INR, PROTIME in the last 168 hours. Cardiac Enzymes: No results for input(s): CKTOTAL, CKMB, CKMBINDEX, TROPONINI in the last 168 hours. BNP (last 3 results) No results for input(s): PROBNP in the last 8760 hours. HbA1C: No results for input(s): HGBA1C in the last 72 hours. CBG: Recent Labs  Lab 02/08/20 1227 02/08/20 2019 02/08/20 2150  GLUCAP 387* 276*  260*   Lipid Profile: No results for input(s): CHOL, HDL, LDLCALC, TRIG, CHOLHDL, LDLDIRECT in the last 72 hours. Thyroid Function Tests: No results for input(s): TSH, T4TOTAL, FREET4, T3FREE, THYROIDAB in the last 72 hours. Anemia Panel: No results for input(s): VITAMINB12, FOLATE, FERRITIN, TIBC, IRON, RETICCTPCT in the last 72 hours. Urine analysis:    Component Value Date/Time   COLORURINE YELLOW 02/08/2020 1250   APPEARANCEUR CLEAR 02/08/2020 1250   LABSPEC <1.005 (L) 02/08/2020 1250   PHURINE 5.5 02/08/2020 1250   GLUCOSEU >=500 (A) 02/08/2020 1250   HGBUR NEGATIVE 02/08/2020 1250   BILIRUBINUR NEGATIVE 02/08/2020 1250   KETONESUR NEGATIVE 02/08/2020 1250   PROTEINUR NEGATIVE 02/08/2020 1250   NITRITE NEGATIVE 02/08/2020 1250   LEUKOCYTESUR NEGATIVE 02/08/2020 1250    Radiological Exams on Admission: CT ABDOMEN PELVIS W CONTRAST  Result Date: 02/08/2020 CLINICAL DATA:  Patient reports abscess to lower abdomen since Monday. Diabetes, area is red and swollen. EXAM: CT ABDOMEN AND PELVIS WITH CONTRAST TECHNIQUE: Multidetector CT imaging of the abdomen and pelvis was performed  using the standard protocol following bolus administration of intravenous contrast. CONTRAST:  136m OMNIPAQUE IOHEXOL 300 MG/ML  SOLN COMPARISON:  Ultrasound abdomen 12/29/2012 FINDINGS: Lower chest: Linear atelectasis versus scarring within the left lower lobe. Pulmonary micronodule within the lingula (2:4). Pulmonary micronodule within the right lower lobe (2:9). Otherwise no acute abnormality. Hepatobiliary: Diffusely hypodense hepatic parenchyma compared to the splenic parenchyma suggestive of hepatic steatosis. Layering hyperdensity within the gallbladder lumen suggestive of sludge versus tiny gallstones. No biliary ductal dilatation. Pancreas: Unremarkable. No pancreatic ductal dilatation or surrounding inflammatory changes. Spleen: Normal in size without focal abnormality. A splenule is identified. Adrenals/Urinary Tract: No adrenal nodule bilaterally. Bilateral kidneys enhance symmetrically. No hydronephrosis. No hydroureter. The urinary bladder is unremarkable. Stomach/Bowel: Stomach is within normal limits. Appendix appears normal. No evidence of bowel wall thickening, distention, or inflammatory changes. Vascular/Lymphatic: No abdominal aorta or iliac aneurysm. No abdominal, pelvic, or inguinal lymphadenopathy. Asymmetrically prominent but nonenlarged inguinal lymph nodes on the right measuring up to 1.1 cm (2:85) that are likely reactive in etiology. Reproductive: Uterus and bilateral adnexa are unremarkable. Other: No intraperitoneal free fluid. No intraperitoneal free gas. No organized fluid collection. Musculoskeletal: Anterior mid to lower abdominal wall subcutaneous soft tissue edema, right greater than left with no organized fluid collection but a region of soft tissue density representing phlegmon formation measuring up to 5 x 11 x 7 cm (2:65, 5:10). Several superficial foci of subcutaneous soft tissue gas are associated with this area ( 2:59-66). No suspicious lytic or blastic osseous lesions.  No acute displaced fracture. IMPRESSION: A 5 x 11 x 7 cm right mid to lower anterior abdominal wall phlegmon formation with several associated foci of subcutaneous soft tissue gas. No drainable organized fluid collection. No intra-abdominal extension. Given findings and patient history, please note that a necrotizing fasciitis is not excluded as this is a clinical diagnosis. No intra-abdominal or intrapelvic abnormality. Electronically Signed   By: MIven FinnM.D.   On: 02/08/2020 16:05    EKG: EKG not available.  Assessment/Plan Active Problems:   Hidradenitis suppurativa   Cellulitis   Sepsis (HCC)   Lactic acid acidosis   Hypoalbuminemia   Hyperglycemia   Iron deficiency anemia due to chronic blood loss   #Cellulitis right lower abdomen #Sepsis from above #Lactic acidosis 2.4  -Patient started on Vanco/Zosyn as well as clindamycin.  On-call surgery was consulted who will review films to decide on management. -Continue with  IV fluids.  #Hypoalbuminemia 2.8 #Iron deficiency anemia.     #DM noncompliant with medications -Appears poorly controlled.  Awaiting HbA1c. -Continue with IV fluids -Sliding scale coverage.  Hold glipizide and Metformin.  DVT prophylaxis: Lovenox.    Code Status: Full code Family Communication: None.   Disposition Plan: Home.    Consults called: General surgery Autumn Messing. Admission status: Inpatient.   Terrika Zuver MD [facility]   If 7PM-7AM, please contact night-coverage [email or web address] Password [facility]  02/08/2020, 10:03 PM

## 2020-02-08 NOTE — ED Notes (Signed)
Attempted IV X2 unable to obtain  

## 2020-02-08 NOTE — Progress Notes (Addendum)
Pharmacy Antibiotic Note  Deondrea Eppolito is a 34 y.o. female admitted on 02/08/2020 with cellulitis.  Pharmacy has been consulted for vancomycin dosing.    Diagnosis changed to possible sepsis - added zosyn consult  Plan: Vancomycin 1 gram IV every 12 hours.  Goal -  AUC ~ 480 mg*hr/L Zosyn 3.375 gm IV every 8 hours  Height: 5\' 5"  (165.1 cm) Weight: 108.9 kg (240 lb) IBW/kg (Calculated) : 57  Temp (24hrs), Avg:99.3 F (37.4 C), Min:98.3 F (36.8 C), Max:100.3 F (37.9 C)  Recent Labs  Lab 02/08/20 1250 02/08/20 1514  WBC 16.7*  --   CREATININE 0.53  --   LATICACIDVEN 2.4* 1.3    Estimated Creatinine Clearance: 119.4 mL/min (by C-G formula based on SCr of 0.53 mg/dL).    No Known Allergies  Antimicrobials this admission: Vancomycin 1 gram IV x 1 Zosyn 3.375 gm X1     Microbiology results: Blood cultures added  Thank you for allowing pharmacy to be a part of this patient's care.  02/10/20 A Haydyn Liddell 02/08/2020 4:00 PM

## 2020-02-09 ENCOUNTER — Encounter (HOSPITAL_COMMUNITY): Payer: Self-pay | Admitting: Internal Medicine

## 2020-02-09 ENCOUNTER — Inpatient Hospital Stay (HOSPITAL_COMMUNITY): Payer: BC Managed Care – PPO | Admitting: Certified Registered Nurse Anesthetist

## 2020-02-09 ENCOUNTER — Encounter (HOSPITAL_COMMUNITY)
Admission: EM | Disposition: A | Discharge status: Home Health | Payer: Self-pay | Source: Home / Self Care | Attending: Internal Medicine

## 2020-02-09 HISTORY — PX: LAPAROTOMY: SHX154

## 2020-02-09 LAB — COMPREHENSIVE METABOLIC PANEL
ALT: 13 U/L (ref 0–44)
AST: 15 U/L (ref 15–41)
Albumin: 2.2 g/dL — ABNORMAL LOW (ref 3.5–5.0)
Alkaline Phosphatase: 72 U/L (ref 38–126)
Anion gap: 12 (ref 5–15)
BUN: 5 mg/dL — ABNORMAL LOW (ref 6–20)
CO2: 22 mmol/L (ref 22–32)
Calcium: 8 mg/dL — ABNORMAL LOW (ref 8.9–10.3)
Chloride: 100 mmol/L (ref 98–111)
Creatinine, Ser: 0.48 mg/dL (ref 0.44–1.00)
GFR calc Af Amer: >60 mL/min (ref >60)
GFR calc non Af Amer: >60 mL/min (ref >60)
Glucose, Bld: 235 mg/dL — ABNORMAL HIGH (ref 70–99)
Potassium: 3.2 mmol/L — ABNORMAL LOW (ref 3.5–5.1)
Sodium: 134 mmol/L — ABNORMAL LOW (ref 135–145)
Total Bilirubin: 1.7 mg/dL — ABNORMAL HIGH (ref 0.3–1.2)
Total Protein: 6.6 g/dL (ref 6.5–8.1)

## 2020-02-09 LAB — LIPID PANEL
Cholesterol: 94 mg/dL (ref 0–200)
HDL: 22 mg/dL — ABNORMAL LOW (ref >40)
LDL Cholesterol: 51 mg/dL (ref 0–99)
Total CHOL/HDL Ratio: 4.3 RATIO
Triglycerides: 104 mg/dL (ref <150)
VLDL: 21 mg/dL (ref 0–40)

## 2020-02-09 LAB — CBC
HCT: 30.3 % — ABNORMAL LOW (ref 36.0–46.0)
Hemoglobin: 10 g/dL — ABNORMAL LOW (ref 12.0–15.0)
MCH: 27 pg (ref 26.0–34.0)
MCHC: 33 g/dL (ref 30.0–36.0)
MCV: 81.9 fL (ref 80.0–100.0)
Platelets: 199 10*3/uL (ref 150–400)
RBC: 3.7 MIL/uL — ABNORMAL LOW (ref 3.87–5.11)
RDW: 12.8 % (ref 11.5–15.5)
WBC: 14.3 10*3/uL — ABNORMAL HIGH (ref 4.0–10.5)
nRBC: 0 % (ref 0.0–0.2)

## 2020-02-09 LAB — HIV ANTIBODY (ROUTINE TESTING W REFLEX): HIV Screen 4th Generation wRfx: NONREACTIVE

## 2020-02-09 LAB — GLUCOSE, CAPILLARY
Glucose-Capillary: 231 mg/dL — ABNORMAL HIGH (ref 70–99)
Glucose-Capillary: 216 mg/dL — ABNORMAL HIGH (ref 70–99)
Glucose-Capillary: 179 mg/dL — ABNORMAL HIGH (ref 70–99)
Glucose-Capillary: 162 mg/dL — ABNORMAL HIGH (ref 70–99)
Glucose-Capillary: 171 mg/dL — ABNORMAL HIGH (ref 70–99)
Glucose-Capillary: 346 mg/dL — ABNORMAL HIGH (ref 70–99)

## 2020-02-09 LAB — TSH: TSH: 1.419 u[IU]/mL (ref 0.350–4.500)

## 2020-02-09 LAB — TROPONIN I (HIGH SENSITIVITY)
Troponin I (High Sensitivity): 3 ng/L (ref <18)
Troponin I (High Sensitivity): 3 ng/L (ref <18)

## 2020-02-09 LAB — IRON AND TIBC
Iron: 26 ug/dL — ABNORMAL LOW (ref 28–170)
Saturation Ratios: 12 % (ref 10.4–31.8)
TIBC: 210 ug/dL — ABNORMAL LOW (ref 250–450)
UIBC: 184 ug/dL

## 2020-02-09 LAB — HEMOGLOBIN A1C
Hgb A1c MFr Bld: 10.8 % — ABNORMAL HIGH (ref 4.8–5.6)
Mean Plasma Glucose: 263.26 mg/dL

## 2020-02-09 LAB — FERRITIN: Ferritin: 197 ng/mL (ref 11–307)

## 2020-02-09 SURGERY — LAPAROTOMY, EXPLORATORY
Anesthesia: General

## 2020-02-09 MED ORDER — DEXMEDETOMIDINE (PRECEDEX) IN NS 20 MCG/5ML (4 MCG/ML) IV SYRINGE
PREFILLED_SYRINGE | INTRAVENOUS | Status: DC | PRN
  Administered 2020-02-09 (×2): 10 ug via INTRAVENOUS

## 2020-02-09 MED ORDER — FENTANYL CITRATE (PF) 250 MCG/5ML IJ SOLN
INTRAMUSCULAR | Status: DC | PRN
Start: 2020-02-09 — End: 2020-02-10
  Administered 2020-02-09: 100 ug via INTRAVENOUS

## 2020-02-09 MED ORDER — LIDOCAINE 2% (20 MG/ML) 5 ML SYRINGE
INTRAMUSCULAR | Status: AC
  Filled 2020-02-09: qty 5

## 2020-02-09 MED ORDER — LIDOCAINE 2% (20 MG/ML) 5 ML SYRINGE
INTRAMUSCULAR | Status: DC | PRN
  Administered 2020-02-09: 80 mg via INTRAVENOUS

## 2020-02-09 MED ORDER — INSULIN GLARGINE 100 UNIT/ML ~~LOC~~ SOLN
12.0000 [IU] | Freq: Every day | SUBCUTANEOUS | Status: DC
  Administered 2020-02-09 (×2): 12 [IU] via SUBCUTANEOUS
  Filled 2020-02-09 (×2): qty 0.12

## 2020-02-09 MED ORDER — DEXAMETHASONE SODIUM PHOSPHATE 10 MG/ML IJ SOLN
INTRAMUSCULAR | Status: AC
  Filled 2020-02-09: qty 1

## 2020-02-09 MED ORDER — PROPOFOL 10 MG/ML IV BOLUS
INTRAVENOUS | Status: AC
  Filled 2020-02-09: qty 20

## 2020-02-09 MED ORDER — SODIUM CHLORIDE 0.9 % IV SOLN
2.0000 g | Freq: Three times a day (TID) | INTRAVENOUS | Status: DC
  Administered 2020-02-09 – 2020-02-11 (×6): 2 g via INTRAVENOUS
  Filled 2020-02-09 (×5): qty 2
  Filled 2020-02-09 (×2): qty 0.08

## 2020-02-09 MED ORDER — 0.9 % SODIUM CHLORIDE (POUR BTL) OPTIME
TOPICAL | Status: DC | PRN
  Administered 2020-02-09: 1000 mL

## 2020-02-09 MED ORDER — MIDAZOLAM HCL 2 MG/2ML IJ SOLN
INTRAMUSCULAR | Status: AC
  Filled 2020-02-09: qty 2

## 2020-02-09 MED ORDER — MIDAZOLAM HCL 5 MG/5ML IJ SOLN
INTRAMUSCULAR | Status: DC | PRN
  Administered 2020-02-09: 2 mg via INTRAVENOUS

## 2020-02-09 MED ORDER — LACTATED RINGERS IV SOLN: INTRAVENOUS | Status: DC | PRN

## 2020-02-09 MED ORDER — SUGAMMADEX SODIUM 500 MG/5ML IV SOLN
INTRAVENOUS | Status: AC
  Filled 2020-02-09: qty 5

## 2020-02-09 MED ORDER — FENTANYL CITRATE (PF) 100 MCG/2ML IJ SOLN: 25.0000 ug | INTRAMUSCULAR | Status: DC | PRN

## 2020-02-09 MED ORDER — FENTANYL CITRATE (PF) 100 MCG/2ML IJ SOLN
INTRAMUSCULAR | Status: AC
  Filled 2020-02-09: qty 2

## 2020-02-09 MED ORDER — ONDANSETRON HCL 4 MG/2ML IJ SOLN
INTRAMUSCULAR | Status: DC | PRN
  Administered 2020-02-09: 4 mg via INTRAVENOUS

## 2020-02-09 MED ORDER — DEXAMETHASONE SODIUM PHOSPHATE 10 MG/ML IJ SOLN
INTRAMUSCULAR | Status: DC | PRN
  Administered 2020-02-09: 10 mg via INTRAVENOUS

## 2020-02-09 MED ORDER — PROPOFOL 10 MG/ML IV BOLUS
INTRAVENOUS | Status: DC | PRN
  Administered 2020-02-09: 150 mg via INTRAVENOUS

## 2020-02-09 MED ORDER — ROCURONIUM BROMIDE 10 MG/ML (PF) SYRINGE
PREFILLED_SYRINGE | INTRAVENOUS | Status: DC | PRN
  Administered 2020-02-09: 10 mg via INTRAVENOUS
  Administered 2020-02-09: 30 mg via INTRAVENOUS

## 2020-02-09 MED ORDER — DEXMEDETOMIDINE (PRECEDEX) IN NS 20 MCG/5ML (4 MCG/ML) IV SYRINGE
PREFILLED_SYRINGE | INTRAVENOUS | Status: AC
  Filled 2020-02-09: qty 5

## 2020-02-09 MED ORDER — ACETAMINOPHEN 325 MG PO TABS
325.0000 mg | ORAL_TABLET | ORAL | Status: DC | PRN
  Administered 2020-02-09: 650 mg via ORAL

## 2020-02-09 MED ORDER — SUCCINYLCHOLINE CHLORIDE 200 MG/10ML IV SOSY
PREFILLED_SYRINGE | INTRAVENOUS | Status: DC | PRN
  Administered 2020-02-09: 200 mg via INTRAVENOUS

## 2020-02-09 MED ORDER — METRONIDAZOLE IN NACL 5-0.79 MG/ML-% IV SOLN
500.0000 mg | Freq: Three times a day (TID) | INTRAVENOUS | Status: DC
  Administered 2020-02-09 – 2020-02-11 (×6): 500 mg via INTRAVENOUS
  Filled 2020-02-09 (×7): qty 100

## 2020-02-09 MED ORDER — ROCURONIUM BROMIDE 10 MG/ML (PF) SYRINGE
PREFILLED_SYRINGE | INTRAVENOUS | Status: AC
  Filled 2020-02-09: qty 10

## 2020-02-09 MED ORDER — CLINDAMYCIN PHOSPHATE 600 MG/50ML IV SOLN
600.0000 mg | Freq: Three times a day (TID) | INTRAVENOUS | Status: DC
  Administered 2020-02-09: 600 mg via INTRAVENOUS
  Filled 2020-02-09: qty 50

## 2020-02-09 MED ORDER — ONDANSETRON HCL 4 MG/2ML IJ SOLN
INTRAMUSCULAR | Status: AC
  Filled 2020-02-09: qty 2

## 2020-02-09 MED ORDER — SUGAMMADEX SODIUM 200 MG/2ML IV SOLN
INTRAVENOUS | Status: DC | PRN
  Administered 2020-02-09: 500 mg via INTRAVENOUS

## 2020-02-09 MED ORDER — ACETAMINOPHEN 325 MG PO TABS
ORAL_TABLET | ORAL | Status: AC
  Filled 2020-02-09: qty 2

## 2020-02-09 MED ORDER — ACETAMINOPHEN 160 MG/5ML PO SOLN: 325.0000 mg | ORAL | Status: DC | PRN

## 2020-02-09 SURGICAL SUPPLY — 44 items
BLADE EXTENDED COATED 6.5IN (ELECTRODE) ×2 IMPLANT
BNDG GAUZE ELAST 4 BULKY (GAUZE/BANDAGES/DRESSINGS) ×2 IMPLANT
CHLORAPREP W/TINT 26 (MISCELLANEOUS) ×2 IMPLANT
COVER MAYO STAND STRL (DRAPES) ×2 IMPLANT
COVER WAND RF STERILE (DRAPES) IMPLANT
DRAIN CHANNEL 19F RND (DRAIN) IMPLANT
DRAPE LAPAROSCOPIC ABDOMINAL (DRAPES) ×2 IMPLANT
DRAPE SHEET LG 3/4 BI-LAMINATE (DRAPES) IMPLANT
DRAPE UTILITY XL STRL (DRAPES) ×2 IMPLANT
DRAPE WARM FLUID 44X44 (DRAPES) ×2 IMPLANT
DRSG HYDROCOLLOID 4X4 (GAUZE/BANDAGES/DRESSINGS) ×2 IMPLANT
DRSG PAD ABDOMINAL 8X10 ST (GAUZE/BANDAGES/DRESSINGS) ×2 IMPLANT
ELECT REM PT RETURN 15FT ADLT (MISCELLANEOUS) ×2 IMPLANT
EVACUATOR SILICONE 100CC (DRAIN) IMPLANT
GAUZE SPONGE 4X4 12PLY STRL (GAUZE/BANDAGES/DRESSINGS) ×2 IMPLANT
GLOVE ECLIPSE 8.0 STRL XLNG CF (GLOVE) ×4 IMPLANT
GLOVE INDICATOR 8.0 STRL GRN (GLOVE) ×2 IMPLANT
GOWN STRL REUS W/TWL XL LVL3 (GOWN DISPOSABLE) ×4 IMPLANT
HANDLE SUCTION POOLE (INSTRUMENTS) IMPLANT
KIT BASIN OR (CUSTOM PROCEDURE TRAY) ×2 IMPLANT
KIT TURNOVER KIT A (KITS) IMPLANT
LEGGING LITHOTOMY PAIR STRL (DRAPES) IMPLANT
PACK GENERAL/GYN (CUSTOM PROCEDURE TRAY) ×2 IMPLANT
PAD POSITIONING PINK XL (MISCELLANEOUS) ×2 IMPLANT
PENCIL SMOKE EVACUATOR (MISCELLANEOUS) IMPLANT
SPONGE LAP 18X18 RF (DISPOSABLE) IMPLANT
STAPLER VISISTAT 35W (STAPLE) ×2 IMPLANT
SUCTION POOLE HANDLE (INSTRUMENTS)
SUT ETHILON 3 0 PS 1 (SUTURE) IMPLANT
SUT NOVA 1 T20/GS 25DT (SUTURE) IMPLANT
SUT PDS AB 1 CTX 36 (SUTURE) IMPLANT
SUT PDS AB 1 TP1 96 (SUTURE) IMPLANT
SUT SILK 2 0 (SUTURE)
SUT SILK 2 0 SH CR/8 (SUTURE) IMPLANT
SUT SILK 2-0 18XBRD TIE 12 (SUTURE) IMPLANT
SUT SILK 3 0 (SUTURE)
SUT SILK 3 0 SH CR/8 (SUTURE) IMPLANT
SUT SILK 3-0 18XBRD TIE 12 (SUTURE) IMPLANT
SUT VIC AB 2-0 SH 18 (SUTURE) IMPLANT
SUT VIC AB 3-0 SH 18 (SUTURE) IMPLANT
TOWEL OR 17X26 10 PK STRL BLUE (TOWEL DISPOSABLE) IMPLANT
TOWEL OR NON WOVEN STRL DISP B (DISPOSABLE) IMPLANT
TRAY FOLEY MTR SLVR 14FR STAT (SET/KITS/TRAYS/PACK) IMPLANT
TRAY FOLEY MTR SLVR 16FR STAT (SET/KITS/TRAYS/PACK) IMPLANT

## 2020-02-09 NOTE — Progress Notes (Signed)
MD Mujtaba was paged about patient complaining about pressure/pain in her chest, 5 out of 10. She is normal sinus rhythm on tele at the moment.

## 2020-02-09 NOTE — Op Note (Signed)
02/08/2020 - 02/09/2020  2:34 PM  PATIENT:  [name]  34 y.o. female  Patient Care Team: Lynnda Child, MD as PCP - General (Family Medicine)  PRE-OPERATIVE DIAGNOSIS:  Abdominal wall abscess  POST-OPERATIVE DIAGNOSIS:  Same  PROCEDURE:   1. Incision and drainage of abdominal wall abscess 2. Soft tissue debridement of abdominal wall - 10 x 15 cm down to the level of fascia  SURGEON:  Stephanie Coup. Ashleymarie Granderson, MD  ASSISTANT: OR staff  ANESTHESIA:   general  COUNTS:  Sponge, needle and instrument counts were reported correct x2 at the conclusion of the operation.  EBL: 5 mL  DRAINS: None  SPECIMEN: Cultures of wound  COMPLICATIONS: None  FINDINGS: Large abdominal wall abscess and phlegmon with foul smelling purulent fluid - sent for culture. Wound tracks to fascia on abdominal wall and this was all debrided with scissors. Packed with iodoform gauze  DISPOSITION: PACU in satisfactory condition  INDICATION: Ms. Bratz is a very pleasant 34yoF whom presented with abdominal wall abscess in setting of diabetes. She had a large phlegmon/abscess on her CT and was draining foul smelling fluid. We discussed proceeding to the OR for incision/drainage of this - please refer to notes elsewhere for details regarding these discussions.  DESCRIPTION: The patient was identified in preop holding and taken to the OR where she was placed on the operating room table. SCDs were placed. General endotracheal anesthesia was induced without difficulty. She was then prepped and draped in the usual sterile fashion. A surgical timeout was performed indicating the correct patient, procedure, positioning and need for preoperative antibiotics.   A large abscess with thinned skin that was dark and necrotic was present on right anterior abdominal wall. This skin was excised and copious foul smelling pus drained. This was sent for culture. All pus was evacuated. There was a multi-loculated cavity with some  necrotic fat and chronic appearing thickened abscess wall. This was all debrided sharply using mayo scissors and extended down to the level of the abdominal wall fascia. All necrotic tissue was removed. The wound was copiously irrigated and no purulent fluid remained. This measured 10 x 15 cm in size. This was hemostatic. It was packed with betadine soaked kerlex, covered in 4x4 gauze and an ABD pad. She was then awakened from anesthesia, extubated and transferred to a stretcher for transport to PACU in satisfactory condition.

## 2020-02-09 NOTE — Progress Notes (Signed)
Inpatient Diabetes Program Recommendations  AACE/ADA: New Consensus Statement on Inpatient Glycemic Control (2015)  Target Ranges:  Prepandial:   less than 140 mg/dL      Peak postprandial:   less than 180 mg/dL (1-2 hours)      Critically ill patients:  140 - 180 mg/dL   Lab Results  Component Value Date   GLUCAP 231 (H) 02/09/2020   HGBA1C 10.8 (H) 02/08/2020    Review of Glycemic Control Results for Haley [name], Haley [name] (MRN [id number]) as of 02/09/2020 11:10  Ref. Range 02/08/2020 12:27 02/08/2020 20:19 02/08/2020 21:50 02/09/2020 07:56  Glucose-Capillary Latest Ref Range: 70 - 99 mg/dL 626 (H) 948 (H) 546 (H) 231 (H)    abdominal wall abscess Diabetes history: DM 2 Outpatient Diabetes medications: Glipizide 5 mg bid, Metformin 1000 mg bid Current orders for Inpatient glycemic control:  Lantus 12 units Novolog 0-15 units tid + hs  A1c 10.8% this admission down from 12.6% on 6/17  May benefit from insulin at time of d/c. [employer] insurance  Inpatient Diabetes Program Recommendations:    Increase Lantus to 18 units.   Christena Deem RN, MSN, BC-ADM Inpatient Diabetes Coordinator Team Pager [phone] (8a-5p)

## 2020-02-09 NOTE — Anesthesia Postprocedure Evaluation (Signed)
Anesthesia Post Note  Patient: Haley [name]  Procedure(s) Performed: INCISION AND DRAINAGE ABDOMINAL WALL ABCESS (N/A )     Patient location during evaluation: PACU Anesthesia Type: General Level of consciousness: awake and alert Pain management: pain level controlled Vital Signs Assessment: post-procedure vital signs reviewed and stable Respiratory status: spontaneous breathing, nonlabored ventilation, respiratory function stable and patient connected to nasal cannula oxygen Cardiovascular status: blood pressure returned to baseline and stable Postop Assessment: no apparent nausea or vomiting Anesthetic complications: no   No complications documented.  Last Vitals:  Vitals:   02/09/20 1516 02/09/20 1530  BP:    Pulse: 99 95  Resp: 16 14  Temp:  37.7 C  SpO2: 96% 95%    Last Pain:  Vitals:   02/09/20 1530  TempSrc:   PainSc: 0-No pain                 Koby Pickup L Mong Neal

## 2020-02-09 NOTE — Progress Notes (Signed)
Patient's temp is 100.6, that makes her a yellow MEWS, protocol initiated. PRN Tylenol was given.

## 2020-02-09 NOTE — Progress Notes (Signed)
PROGRESS NOTE    Haley [name]  HFS:[id number] DOB: 10-28-1985 DOA: 02/08/2020 PCP: Lesleigh Noe, MD   Brief Narrative:  HPI: Haley [name] is a 34 y.o. female with DM noncompliant with metformin, h/o hidradenitis suppurativa, bipolar disorder presented to [facility] 02/08/20 with developing worsening boil and swelling on right lower abdomen.   Patient reports that she first noticed a boil in her right lower abdomen almost a week ago.  She used leftover doxycycline 100 mg twice daily for 4 days without any significant improvement in boil that kept growing in size with surrounding redness and swelling. Also tried "boil ease", "draining salve"that she bought through [employer] without any significant improvement.  Been using OTC lidocaine for pain relief.  She now reports fever chills and generalized malaise.  She denies any history of MRSA infection reports that last skin infection was in her left axilla that was treated with Keflex in July 2021.  No vomiting or diarrhea.   She is currently single without any children.  Smokes 2 cigars daily for past 15 years.  Denies any drinking or recreational drug use.  She currently works at [employer].  Family history significant for mother with DM and CHF.  Dad also has diabetes.  Otherwise unremarkable.   ED Course: 129/82, pulse 131, RR 20, SPO2 99% Temp 100.3 F  Lactic acid 2.4--1.3 Sodium 139, potassium 3.4 Albumin 2.8 WBC 16.7, Hb 10.8 Code PCR negative Urine pregnancy negative Urinalysis significant glycosuria but no evidence of UTI  CT abdomen with 5X 11 X 7 cm right mid to lower anterior abdominal wall phlegmon formation with several associated foci of subcutaneous soft tissue gas.  No drainable organized fluid collection noted.  No intra-abdominal extension.  Possibility of necrotizing fasciitis cannot be ruled out.  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 Patient continues to have pain.  She was already seen before I saw her today so she had the dressing at the right lower abdomen which I did not remove but she had surrounding erythema.  She was tender to palpation over the dressing.  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  Culture has been sent from discharge as well.  We will continue vancomycin however switch Unasyn and clindamycin to cefepime and Flagyl.  Follow cultures.  Type 2 diabetes mellitus:Hemoglobin A1c 10.8.  Continue to hold glipizide and Metformin and continue on current dose of Lantus 12 units and SSI.  DVT prophylaxis:    Code Status: Full Code  Family Communication:  None present at bedside.  Plan of care discussed with patient in length and he verbalized understanding and agreed with it.  Status is: Inpatient  Remains inpatient appropriate because:Ongoing diagnostic testing needed not appropriate for outpatient work up   Dispo: The patient is from: Home              Anticipated d/c is to: Home  Anticipated d/c date is: 3 days              Patient currently is not medically stable to d/c.        Estimated body mass index is 39.94 kg/m as calculated from the following:   Height as of this encounter: '5\' 5"'  (1.651 m).   Weight as of this encounter: 108.9 kg.      Nutritional status:               Consultants:   General surgery  Procedures:    None  Antimicrobials:  Anti-infectives (From admission, onward)   Start     Dose/Rate Route Frequency Ordered Stop   02/09/20 1600  [MAR Hold]  metroNIDAZOLE (FLAGYL) IVPB 500 mg        (MAR Hold since Tue 02/09/2020 at 1245.Hold Reason: Transfer to a Procedural area.)   500 mg 100 mL/hr over 60 Minutes Intravenous Every 8 hours 02/09/20 1021     02/09/20 1600  [MAR Hold]  ceFEPIme (MAXIPIME) 2 g in sodium chloride 0.9 % 100 mL IVPB        (MAR Hold since Tue 02/09/2020 at 1245.Hold Reason: Transfer to a Procedural area.)   2 g 200 mL/hr over 30 Minutes Intravenous Every 8 hours 02/09/20 1021     02/09/20 0615  clindamycin (CLEOCIN) IVPB 600 mg  Status:  Discontinued        600 mg 100 mL/hr over 30 Minutes Intravenous Every 8 hours 02/09/20 0607 02/09/20 1021   02/08/20 1800  piperacillin-tazobactam (ZOSYN) IVPB 3.375 g  Status:  Discontinued        3.375 g 12.5 mL/hr over 240 Minutes Intravenous Every 8 hours 02/08/20 1625 02/09/20 1021   02/08/20 1615  clindamycin (CLEOCIN) IVPB 600 mg        600 mg 100 mL/hr over 30 Minutes Intravenous  Once 02/08/20 1614 02/08/20 1809   02/08/20 1600  [MAR Hold]  vancomycin (VANCOCIN) IVPB 1000 mg/200 mL premix        (MAR Hold since Tue 02/09/2020 at 1245.Hold Reason: Transfer to a Procedural area.)   1,000 mg 200 mL/hr over 60 Minutes Intravenous Every 12 hours 02/08/20 1559           Subjective: Seen and examined.  Complains of right lower abdominal pain.  No better than yesterday.  Also has some sweating and chills.  No other complaint.  Objective: Vitals:   02/09/20 0304 02/09/20 0354 02/09/20 0600 02/09/20 1100  BP:  126/68 140/78 131/73  Pulse:  94 88 95  Resp:  '20 18 16  ' Temp: (!) 100.4 F (38 C) 98.9 F (37.2 C) 98.7 F (37.1 C) 100.1 F (37.8 C)  TempSrc: Oral Oral Oral Oral  SpO2:  99% 97% 98%  Weight:      Height:        Intake/Output Summary (Last 24 hours) at 02/09/2020 1246 Last data filed at 02/09/2020 0600 Gross  per 24 hour  Intake 2404.36 ml  Output 0 ml  Net 2404.36 ml   Filed Weights   02/08/20 1208  Weight: 108.9 kg    Examination:  General exam: Appears calm and comfortable  Respiratory system: Clear to auscultation. Respiratory effort normal. Cardiovascular system: S1 & S2 heard, RRR. No JVD, murmurs, rubs, gallops or clicks. No pedal edema. Gastrointestinal system: Abdomen is nondistended, soft and tender at right lower quadrant over and around the dressing.  Has visible erythema around that and warm to touch. No organomegaly  or masses felt. Normal bowel sounds heard. Central nervous system: Alert and oriented. No focal neurological deficits. Extremities: Symmetric 5 x 5 power. Skin: No rashes, lesions or ulcers Psychiatry: Judgement and insight appear normal. Mood & affect appropriate.    Data Reviewed: I have personally reviewed following labs and imaging studies  CBC: Recent Labs  Lab 02/08/20 1250 02/08/20 2315 02/09/20 0733  WBC 16.7* 18.8* 14.3*  NEUTROABS 13.9* 15.4*  --   HGB 10.8* 9.9* 10.0*  HCT 33.4* 30.9* 30.3*  MCV 82.1 84.0 81.9  PLT 187 201 116   Basic Metabolic Panel: Recent Labs  Lab 02/08/20 1250 02/08/20 2315 02/09/20 0733  NA 129* 132* 134*  K 3.4* 3.4* 3.2*  CL 95* 99 100  CO2 22 20* 22  GLUCOSE 411* 253* 235*  BUN 7 <5* 5*  CREATININE 0.53 0.46 0.48  CALCIUM 8.3* 8.2* 8.0*   GFR: Estimated Creatinine Clearance: 119.4 mL/min (by C-G formula based on SCr of 0.48 mg/dL). Liver Function Tests: Recent Labs  Lab 02/08/20 1250 02/08/20 2315 02/09/20 0733  AST '22 15 15  ' ALT '17 14 13  ' ALKPHOS 85 76 72  BILITOT 1.2 1.4* 1.7*  PROT 7.7 7.1 6.6  ALBUMIN 2.8* 2.6* 2.2*   No results for input(s): LIPASE, AMYLASE in the last 168 hours. No results for input(s): AMMONIA in the last 168 hours. Coagulation Profile: No results for input(s): INR, PROTIME in the last 168 hours. Cardiac Enzymes: No results for input(s): CKTOTAL, CKMB, CKMBINDEX,  TROPONINI in the last 168 hours. BNP (last 3 results) No results for input(s): PROBNP in the last 8760 hours. HbA1C: Recent Labs    02/08/20 2315  HGBA1C 10.8*   CBG: Recent Labs  Lab 02/08/20 1227 02/08/20 2019 02/08/20 2150 02/09/20 0756  GLUCAP 387* 276* 260* 231*   Lipid Profile: Recent Labs    02/08/20 2315  CHOL 94  HDL 22*  LDLCALC 51  TRIG 104  CHOLHDL 4.3   Thyroid Function Tests: Recent Labs    02/08/20 2315  TSH 1.419   Anemia Panel: Recent Labs    02/08/20 2315  FERRITIN 197  TIBC 210*  IRON 26*   Sepsis Labs: Recent Labs  Lab 02/08/20 1250 02/08/20 1514  LATICACIDVEN 2.4* 1.3    Recent Results (from the past 240 hour(s))  SARS Coronavirus 2 by RT PCR (hospital order, performed in [place] hospital lab) Nasopharyngeal Nasopharyngeal Swab     Status: None   Collection Time: 02/08/20  3:54 PM   Specimen: Nasopharyngeal Swab  Result Value Ref Range Status   SARS Coronavirus 2 NEGATIVE NEGATIVE Final    Comment: (NOTE) SARS-CoV-2 target nucleic acids are NOT DETECTED.  The SARS-CoV-2 RNA is generally detectable in upper and lower respiratory specimens during the acute phase of infection. The lowest concentration of SARS-CoV-2 viral copies this assay can detect is 250 copies / mL. A negative result does not preclude SARS-CoV-2 infection and should not be used as the sole basis for treatment or other patient management decisions.  A negative result may occur with improper specimen collection / handling, submission of specimen other than nasopharyngeal swab, presence of viral mutation(s) within the areas targeted by this assay, and inadequate number of viral copies (<250 copies / mL). A negative result must be combined with clinical observations, patient history, and epidemiological information.  Fact Sheet for Patients:   StrictlyIdeas.no  Fact Sheet for Healthcare  Providers: BankingDealers.co.za  This test is not yet approved or  cleared by  the [employer] and has been authorized for detection and/or diagnosis of SARS-CoV-2 by FDA under an Emergency Use Authorization (EUA).  This EUA will remain in effect (meaning this test can be used) for the duration of the COVID-19 declaration under Section 564(b)(1) of the Act, 21 U.S.C. section 360bbb-3(b)(1), unless the authorization is terminated or revoked sooner.  Performed at [facility], [address] [place], [place] [address]   Blood culture (routine x 2)     Status: None (Preliminary result)   Collection Time: 02/08/20  5:45 PM   Specimen: Right Antecubital; Blood  Result Value Ref Range Status   Specimen Description   Final    RIGHT ANTECUBITAL BLOOD Performed at [facility], [place] [address], [place], [place] [address]    Special Requests   Final    BOTTLES DRAWN AEROBIC AND ANAEROBIC Blood Culture adequate volume Performed at [facility], [address] [place], [place] [address]    Culture   Final    NO GROWTH < 12 HOURS Performed at [facility] [address], [place], [place] [address]    Report Status PENDING  Incomplete      Radiology Studies: CT ABDOMEN PELVIS W CONTRAST  Result Date: 02/08/2020 CLINICAL DATA:  Patient reports abscess to lower abdomen since Monday. Diabetes, area is red and swollen. EXAM: CT ABDOMEN AND PELVIS WITH CONTRAST TECHNIQUE: Multidetector CT imaging of the abdomen and pelvis was performed using the standard protocol following bolus administration of intravenous contrast. CONTRAST:  169m OMNIPAQUE IOHEXOL 300 MG/ML  SOLN COMPARISON:  Ultrasound abdomen 12/29/2012 FINDINGS: Lower chest: Linear atelectasis versus scarring within the left lower lobe. Pulmonary micronodule within the lingula (2:4). Pulmonary micronodule within the right lower lobe (2:9). Otherwise no acute abnormality.  Hepatobiliary: Diffusely hypodense hepatic parenchyma compared to the splenic parenchyma suggestive of hepatic steatosis. Layering hyperdensity within the gallbladder lumen suggestive of sludge versus tiny gallstones. No biliary ductal dilatation. Pancreas: Unremarkable. No pancreatic ductal dilatation or surrounding inflammatory changes. Spleen: Normal in size without focal abnormality. A splenule is identified. Adrenals/Urinary Tract: No adrenal nodule bilaterally. Bilateral kidneys enhance symmetrically. No hydronephrosis. No hydroureter. The urinary bladder is unremarkable. Stomach/Bowel: Stomach is within normal limits. Appendix appears normal. No evidence of bowel wall thickening, distention, or inflammatory changes. Vascular/Lymphatic: No abdominal aorta or iliac aneurysm. No abdominal, pelvic, or inguinal lymphadenopathy. Asymmetrically prominent but nonenlarged inguinal lymph nodes on the right measuring up to 1.1 cm (2:85) that are likely reactive in etiology. Reproductive: Uterus and bilateral adnexa are unremarkable. Other: No intraperitoneal free fluid. No intraperitoneal free gas. No organized fluid collection. Musculoskeletal: Anterior mid to lower abdominal wall subcutaneous soft tissue edema, right greater than left with no organized fluid collection but a region of soft tissue density representing phlegmon formation measuring up to 5 x 11 x 7 cm (2:65, 5:10). Several superficial foci of subcutaneous soft tissue gas are associated with this area ( 2:59-66). No suspicious lytic or blastic osseous lesions. No acute displaced fracture. IMPRESSION: A 5 x 11 x 7 cm right mid to lower anterior abdominal wall phlegmon formation with several associated foci of subcutaneous soft tissue gas. No drainable organized fluid collection. No intra-abdominal extension. Given findings and patient history, please note that a necrotizing fasciitis is not excluded as this is a clinical diagnosis. No intra-abdominal or  intrapelvic abnormality. Electronically Signed   By: MIven FinnM.D.   On: 02/08/2020 16:05  Scheduled Meds: . [MAR Hold] enoxaparin (LOVENOX) injection  55 mg Subcutaneous Q24H  . [MAR Hold] insulin aspart  0-15 Units Subcutaneous TID WC  . [MAR Hold] insulin aspart  0-5 Units Subcutaneous QHS  . [MAR Hold] insulin glargine  12 Units Subcutaneous QHS   Continuous Infusions: . [MAR Hold] sodium chloride 500 mL (02/08/20 1723)  . [MAR Hold] ceFEPime (MAXIPIME) IV    . [MAR Hold] metronidazole    . [MAR Hold] vancomycin 1,000 mg (02/09/20 0310)     LOS: 1 day   Time spent: 37 minutes   Darliss Cheney, MD [facility]  02/09/2020, 12:46 PM   To contact the attending provider between 7A-7P or the covering provider during after hours 7P-7A, please log into the web site [email or web address].

## 2020-02-09 NOTE — Plan of Care (Signed)
  Problem: Clinical Measurements: Goal: Ability to maintain clinical measurements within normal limits will improve Outcome: Progressing Goal: Will remain free from infection Outcome: Progressing Goal: Diagnostic test results will improve Outcome: Progressing Goal: Respiratory complications will improve Outcome: Progressing Goal: Cardiovascular complication will be avoided Outcome: Progressing   Problem: Coping: Goal: Level of anxiety will decrease Outcome: Progressing   Problem: Elimination: Goal: Will not experience complications related to bowel motility Outcome: Progressing Goal: Will not experience complications related to urinary retention Outcome: Progressing   Problem: Safety: Goal: Ability to remain free from injury will improve Outcome: Progressing   Problem: Education: Goal: Knowledge of General Education information will improve Description: Including pain rating scale, medication(s)/side effects and non-pharmacologic comfort measures Outcome: Not Progressing   Problem: Health Behavior/Discharge Planning: Goal: Ability to manage health-related needs will improve Outcome: Not Progressing   Problem: Activity: Goal: Risk for activity intolerance will decrease Outcome: Not Progressing   Problem: Nutrition: Goal: Adequate nutrition will be maintained Outcome: Not Progressing   Problem: Pain Managment: Goal: General experience of comfort will improve Outcome: Not Progressing   Problem: Skin Integrity: Goal: Risk for impaired skin integrity will decrease Outcome: Not Progressing   

## 2020-02-09 NOTE — Progress Notes (Addendum)
Pharmacy Antibiotic Note  Haley [name] is a 34 y.o. female admitted on 02/08/2020 with worsening boil and swelling on right lower abdomen. Per surgery, wound exudate is feculent smelling  Pharmacy has been consulted for vancomycin and cefepime dosing.  Plan: Continue vancomycin 1000 mg iv q 12 hours as per previous note  D/C Zosyn and begin cefepime 2 g iv q 8 hours along with:   Flagyl 500 mg iv q 8 hours per MD  F/U renal function, culture results, and clinical course  Height: 5\' 5"  (165.1 cm) Weight: 108.9 kg (240 lb) IBW/kg (Calculated) : 57  Temp (24hrs), Avg:99.8 F (37.7 C), Min:98.3 F (36.8 C), Max:101.3 F (38.5 C)  Recent Labs  Lab 02/08/20 1250 02/08/20 1514 02/08/20 2315 02/09/20 0733  WBC 16.7*  --  18.8* 14.3*  CREATININE 0.53  --  0.46 0.48  LATICACIDVEN 2.4* 1.3  --   --     Estimated Creatinine Clearance: 119.4 mL/min (by C-G formula based on SCr of 0.48 mg/dL).    No Known Allergies  Antimicrobials this admission: 9/13 Clindamycin >> 9/14 9/13 Zosyn >> 9/14 9/13 vancomycin >> 9/14 cefepime >>  9/14 Flagyl >>  Dose adjustments this admission:  Microbiology results: 9/13 BCx: NGTD 9/13 COVID: neg 9/14 Wound Cx:   Thank you for allowing pharmacy to be a part of this patient's care.  10/14 D 02/09/2020 10:14 AM

## 2020-02-09 NOTE — Anesthesia Preprocedure Evaluation (Addendum)
Anesthesia Evaluation  Patient identified by MRN, date of birth, ID band Patient awake    Reviewed: Allergy & Precautions, NPO status , Patient's Chart, lab work & pertinent test results  Airway Mallampati: III  TM Distance: >3 FB Neck ROM: Full  Mouth opening: Limited Mouth Opening  Dental no notable dental hx. (+) Poor Dentition, Missing, Chipped, Dental Advisory Given,    Pulmonary neg pulmonary ROS, Current Smoker and Patient abstained from smoking.,    Pulmonary exam normal breath sounds clear to auscultation       Cardiovascular negative cardio ROS Normal cardiovascular exam Rhythm:Regular Rate:Normal     Neuro/Psych PSYCHIATRIC DISORDERS Schizophrenia negative neurological ROS     GI/Hepatic negative GI ROS, Neg liver ROS,   Endo/Other  diabetes, Type obesity (BMI 40)  Renal/GU negative Renal ROS  negative genitourinary   Musculoskeletal negative musculoskeletal ROS (+)   Abdominal   Peds  Hematology  (+) Blood dyscrasia (Hgb 10), anemia ,   Anesthesia Other Findings h/o hidradenitis suppurativa, presented to [facility] 02/08/20 with developing worsening boil and swelling on right lower abdomen c/f abdominal abscess and cellulitis with 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 now with sepsis  Reproductive/Obstetrics                           Anesthesia Physical Anesthesia Plan  ASA: III  Anesthesia Plan: General   Post-op Pain Management:    Induction: Intravenous  PONV Risk Score and Plan: 2 and Midazolam, Dexamethasone and Ondansetron  Airway Management Planned: Oral ETT  Additional Equipment:   Intra-op Plan:   Post-operative Plan: Extubation in OR  Informed Consent: I have reviewed the patients History and Physical, chart, labs and discussed the procedure including the risks, benefits and alternatives for the proposed anesthesia with the patient or authorized representative who  has indicated his/her understanding and acceptance.     Dental advisory given  Plan Discussed with: CRNA  Anesthesia Plan Comments:        Anesthesia Quick Evaluation

## 2020-02-09 NOTE — Anesthesia Procedure Notes (Signed)
Procedure Name: Intubation Date/Time: 02/09/2020 2:01 PM Performed by: Lorelee Market, CRNA Pre-anesthesia Checklist: Patient identified, Emergency Drugs available, Suction available and Patient being monitored Patient Re-evaluated:Patient Re-evaluated prior to induction Oxygen Delivery Method: Circle system utilized Preoxygenation: Pre-oxygenation with 100% oxygen Induction Type: IV induction and Rapid sequence Grade View: Grade I Tube type: Oral Tube size: 7.0 mm Number of attempts: 1 Airway Equipment and Method: Stylet and Oral airway Placement Confirmation: ETT inserted through vocal cords under direct vision,  positive ETCO2 and breath sounds checked- equal and bilateral Secured at: 25 cm Tube secured with: Tape Dental Injury: Teeth and Oropharynx as per pre-operative assessment

## 2020-02-10 ENCOUNTER — Encounter (HOSPITAL_COMMUNITY): Payer: Self-pay | Admitting: Surgery

## 2020-02-10 DIAGNOSIS — L03311 Cellulitis of abdominal wall: Secondary | ICD-10-CM

## 2020-02-10 DIAGNOSIS — A419 Sepsis, unspecified organism: Principal | ICD-10-CM

## 2020-02-10 LAB — GLUCOSE, CAPILLARY
Glucose-Capillary: 367 mg/dL — ABNORMAL HIGH (ref 70–99)
Glucose-Capillary: 296 mg/dL — ABNORMAL HIGH (ref 70–99)
Glucose-Capillary: 290 mg/dL — ABNORMAL HIGH (ref 70–99)
Glucose-Capillary: 357 mg/dL — ABNORMAL HIGH (ref 70–99)

## 2020-02-10 LAB — COMPREHENSIVE METABOLIC PANEL
ALT: 19 U/L (ref 0–44)
AST: 22 U/L (ref 15–41)
Albumin: 2.6 g/dL — ABNORMAL LOW (ref 3.5–5.0)
Alkaline Phosphatase: 93 U/L (ref 38–126)
Anion gap: 15 (ref 5–15)
BUN: 13 mg/dL (ref 6–20)
CO2: 19 mmol/L — ABNORMAL LOW (ref 22–32)
Calcium: 8.8 mg/dL — ABNORMAL LOW (ref 8.9–10.3)
Chloride: 101 mmol/L (ref 98–111)
Creatinine, Ser: 0.53 mg/dL (ref 0.44–1.00)
GFR calc Af Amer: >60 mL/min (ref >60)
GFR calc non Af Amer: >60 mL/min (ref >60)
Glucose, Bld: 381 mg/dL — ABNORMAL HIGH (ref 70–99)
Potassium: 3.7 mmol/L (ref 3.5–5.1)
Sodium: 135 mmol/L (ref 135–145)
Total Bilirubin: 1.5 mg/dL — ABNORMAL HIGH (ref 0.3–1.2)
Total Protein: 7.6 g/dL (ref 6.5–8.1)

## 2020-02-10 LAB — CBC
HCT: 34.2 % — ABNORMAL LOW (ref 36.0–46.0)
Hemoglobin: 10.9 g/dL — ABNORMAL LOW (ref 12.0–15.0)
MCH: 26.5 pg (ref 26.0–34.0)
MCHC: 31.9 g/dL (ref 30.0–36.0)
MCV: 83.2 fL (ref 80.0–100.0)
Platelets: 277 10*3/uL (ref 150–400)
RBC: 4.11 MIL/uL (ref 3.87–5.11)
RDW: 12.8 % (ref 11.5–15.5)
WBC: 16.5 10*3/uL — ABNORMAL HIGH (ref 4.0–10.5)
nRBC: 0 % (ref 0.0–0.2)

## 2020-02-10 MED ORDER — MORPHINE SULFATE (PF) 2 MG/ML IV SOLN
INTRAVENOUS | Status: AC
  Filled 2020-02-10: qty 1

## 2020-02-10 MED ORDER — INSULIN GLARGINE 100 UNIT/ML ~~LOC~~ SOLN
16.0000 [IU] | Freq: Every day | SUBCUTANEOUS | Status: DC
  Filled 2020-02-10: qty 0.16

## 2020-02-10 MED ORDER — INSULIN GLARGINE 100 UNIT/ML ~~LOC~~ SOLN
20.0000 [IU] | Freq: Every day | SUBCUTANEOUS | Status: DC
  Administered 2020-02-10: 20 [IU] via SUBCUTANEOUS
  Filled 2020-02-10: qty 0.2

## 2020-02-10 MED ORDER — MORPHINE SULFATE (PF) 2 MG/ML IV SOLN
2.0000 mg | Freq: Once | INTRAVENOUS | Status: AC
  Administered 2020-02-10: 2 mg via INTRAVENOUS

## 2020-02-10 NOTE — Progress Notes (Signed)
1 Day Post-Op  Subjective: CC: Patient reports she had no pain overnight or this morning. Febrile to 101.5 yesterday afternoon.   Objective: Vital signs in last 24 hours: Temp:  [97.8 F (36.6 C)-101.5 F (38.6 C)] 97.8 F (36.6 C) (09/14 2114) Pulse Rate:  [68-114] 68 (09/15 0606) Resp:  [14-22] 16 (09/15 0606) BP: (113-155)/(68-124) 113/72 (09/15 0606) SpO2:  [92 %-100 %] 99 % (09/15 0606) Weight:  [108.9 kg] 108.9 kg (09/14 1255) Last BM Date:  (pt said she does not remember)  Intake/Output from previous day: 09/14 0701 - 09/15 0700 In: 1226.3 [I.V.:600; IV Piggyback:626.3] Out: 10 [Blood:10] Intake/Output this shift: Total I/O In: 175.7 [I.V.:0.2; IV Piggyback:175.6]   Out: -   PE: Gen: Awake and alert, NAD Lungs: Normal rate and effort  Abd/Wound: See picture below. Wound measures 10cm in greatest diameter. 4-5cm in depth. There is 6cm of tunneling at the 10 o'clock position (right lateral). Base of wound with some granulation tissue and cauterized tissue. No drainage or purulence. Some surrounding induration without erythema.      Lab Results:  Recent Labs    02/09/20 0733 02/10/20 0538  WBC 14.3* 16.5*  HGB 10.0* 10.9*  HCT 30.3* 34.2*  PLT 199 277   BMET Recent Labs    02/09/20 0733 02/10/20 0538  NA 134* 135  K 3.2* 3.7  CL 100 101  CO2 22 19*  GLUCOSE 235* 381*  BUN 5* 13  CREATININE 0.48 0.53  CALCIUM 8.0* 8.8*   PT/INR No results for input(s): LABPROT, INR in the last 72 hours. CMP     Component Value Date/Time   NA 135 02/10/2020 0538   K 3.7 02/10/2020 0538   CL 101 02/10/2020 0538   CO2 19 (L) 02/10/2020 0538   GLUCOSE 381 (H) 02/10/2020 0538   BUN 13 02/10/2020 0538   CREATININE 0.53 02/10/2020 0538   CALCIUM 8.8 (L) 02/10/2020 0538   PROT 7.6 02/10/2020 0538   ALBUMIN 2.6 (L) 02/10/2020 0538   AST 22 02/10/2020 0538   ALT 19 02/10/2020 0538   ALKPHOS 93 02/10/2020 0538   BILITOT 1.5 (H) 02/10/2020 0538   GFRNONAA  >60 02/10/2020 0538   GFRAA >60 02/10/2020 0538   Lipase  No results found for: LIPASE     Studies/Results: CT ABDOMEN PELVIS W CONTRAST  Result Date: 02/08/2020 CLINICAL DATA:  Patient reports abscess to lower abdomen since Monday. Diabetes, area is red and swollen. EXAM: CT ABDOMEN AND PELVIS WITH CONTRAST TECHNIQUE: Multidetector CT imaging of the abdomen and pelvis was performed using the standard protocol following bolus administration of intravenous contrast. CONTRAST:  OMNIPAQUE IOHEXOL 300 MG/ML  SOLN COMPARISON:  Ultrasound abdomen 12/29/2012 FINDINGS: Lower chest: Linear atelectasis versus scarring within the left lower lobe. Pulmonary micronodule within the lingula (2:4). Pulmonary micronodule within the right lower lobe (2:9). Otherwise no acute abnormality. Hepatobiliary: Diffusely hypodense hepatic parenchyma compared to the splenic parenchyma suggestive of hepatic steatosis. Layering hyperdensity within the gallbladder lumen suggestive of sludge versus tiny gallstones. No biliary ductal dilatation. Pancreas: Unremarkable. No pancreatic ductal dilatation or surrounding inflammatory changes. Spleen: Normal in size without focal abnormality. A splenule is identified. Adrenals/Urinary Tract: No adrenal nodule bilaterally. Bilateral kidneys enhance symmetrically. No hydronephrosis. No hydroureter. The urinary bladder is unremarkable. Stomach/Bowel: Stomach is within normal limits. Appendix appears normal. No evidence of bowel wall thickening, distention, or inflammatory changes. Vascular/Lymphatic: No abdominal aorta or iliac aneurysm. No abdominal, pelvic, or inguinal lymphadenopathy. Asymmetrically prominent but  nonenlarged inguinal lymph nodes on the right measuring up to 1.1 cm (2:85) that are likely reactive in etiology. Reproductive: Uterus and bilateral adnexa are unremarkable. Other: No intraperitoneal free fluid. No intraperitoneal free gas. No organized fluid collection.  Musculoskeletal: Anterior mid to lower abdominal wall subcutaneous soft tissue edema, right greater than left with no organized fluid collection but a region of soft tissue density representing phlegmon formation measuring up to 5 x 11 x 7 cm (2:65, 5:10). Several superficial foci of subcutaneous soft tissue gas are associated with this area ( 2:59-66). No suspicious lytic or blastic osseous lesions. No acute displaced fracture. IMPRESSION: A 5 x 11 x 7 cm right mid to lower anterior abdominal wall phlegmon formation with several associated foci of subcutaneous soft tissue gas. No drainable organized fluid collection. No intra-abdominal extension. Given findings and patient history, please note that a necrotizing fasciitis is not excluded as this is a clinical diagnosis. No intra-abdominal or intrapelvic abnormality. Electronically Signed   By: Tish Frederickson M.D.   On: 02/08/2020 16:05    Anti-infectives: Anti-infectives (From admission, onward)   Start     Dose/Rate Route Frequency Ordered Stop   02/09/20 1600  metroNIDAZOLE (FLAGYL) IVPB 500 mg        500 mg 100 mL/hr over 60 Minutes Intravenous Every 8 hours 02/09/20 1021     02/09/20 1600  ceFEPIme (MAXIPIME) 2 g in sodium chloride 0.9 % 100 mL IVPB        2 g 200 mL/hr over 30 Minutes Intravenous Every 8 hours 02/09/20 1021     02/09/20 0615  clindamycin (CLEOCIN) IVPB 600 mg  Status:  Discontinued        600 mg 100 mL/hr over 30 Minutes Intravenous Every 8 hours 02/09/20 0607 02/09/20 1021   02/08/20 1800  piperacillin-tazobactam (ZOSYN) IVPB 3.375 g  Status:  Discontinued        3.375 g 12.5 mL/hr over 240 Minutes Intravenous Every 8 hours 02/08/20 1625 02/09/20 1021   02/08/20 1615  clindamycin (CLEOCIN) IVPB 600 mg        600 mg 100 mL/hr over 30 Minutes Intravenous  Once 02/08/20 1614 02/08/20 1809   02/08/20 1600  vancomycin (VANCOCIN) IVPB 1000 mg/200 mL premix        1,000 mg 200 mL/hr over 60 Minutes Intravenous Every 12 hours  02/08/20 1559         Assessment/Plan Type II diabetes with poor control Hx of hidradenitis suppurativa Hx of psychosis tobacco use  BMI 39.9  Abdominal abscess  S/p Incision and drainage of abdominal wall abscess w/ Soft tissue debridement of abdominal wall - 10 x 15 cm down to the level of fascia by Dr. Cliffton Asters on 9/14 - POD #1 - Await final cx's. Cont IV abx - Dressing changes, BID WTD - We will follow with you  FEN - CM VTE - SCDs, Lovenox ID - Maxipime/Flagyl/Vanc     LOS: 2 days    Jacinto Halim , [facility] 02/10/2020, 12:30 PM Please see Amion for pager number during day hours 7:00am-4:30pm

## 2020-02-10 NOTE — Progress Notes (Signed)
PROGRESS NOTE    Haley [name]  PRF:[id number] DOB: [birth year] DOA: 02/08/2020 PCP: Lesleigh Noe, MD    Chief Complaint  Patient presents with  . Abscess    Brief Narrative:  Haley [name] a 34 y.o.femalewithDM noncompliant with metformin, h/o hidradenitis suppurativa, bipolar disorder[facility] with developingworsening boil and swellingon right lower abdomen.   Patient reports that she first noticed a boil in her right lower abdomen almost a week ago. She used leftover doxycycline 100 mg twice daily for 4 days without any significant improvement in boil that kept growing in size with surrounding redness and swelling. Also tried"boil ease","draining salve"that she bought through [employer] without any significant improvement. Been using OTC lidocaine for pain relief.  She now reports fever chills and generalized malaise. She denies any history of MRSA infection reports that last skin infection was in her left axilla that was treated with Keflex in July 2021.  Novomiting or diarrhea.   She is currently single without any children. Smokes 2 cigars daily for past 15 years. Denies any drinking or recreational drug use. She currently works at [employer].  Family history significant for mother with DM and CHF. Dad also has diabetes. Otherwise unremarkable.  Patient seen by general surgery, underwent incision and drainage, cultures pending, placed on IV antibiotics.    Assessment & Plan:   Active Problems:   Hidradenitis suppurativa   Cellulitis   Sepsis (HCC)   Lactic acid acidosis   Hypoalbuminemia   Hyperglycemia   Iron deficiency anemia due to chronic blood loss  #1 severe sepsis secondary to abdominal wall abscess/cellulitis, POA On admission patient met criteria for severe sepsis with fever, tachycardia, leukocytosis, lactic acid level of 2.4.  Patient with clinical improvement.  Patient seen in consultation by general surgery patient  subsequently underwent incision and drainage of abdominal wall abscess and soft tissue debridement of abdominal wall 10 x 15 cm down to the level of the fascia per general surgery on 02/09/2020 with cultures pending.  Continue current regimen of IV vancomycin, IV cefepime, IV Flagyl.  Continue current dressing changes as recommended by general surgery.  Will consult with ID tomorrow for antibiotic recommendations and duration.  Per general surgery.  2.  Poorly controlled diabetes mellitus type 2 Hemoglobin A1c 10.8 (02/08/2020).  CBG in the 200s to 300s.  Increase Lantus to 20 units daily.  Continue sliding scale insulin.  Hold oral hypoglycemic agents.  May likely need to go home on insulin.  Diabetes coordinator following.  Will need close outpatient follow-up and management.  3.  Iron deficiency anemia/anemia of chronic disease Hemoglobin currently stable at 10.9.  Patient with no overt bleeding.  Follow H&H.  Transfusion threshold hemoglobin < 7.    DVT prophylaxis: Lovenox Code Status: Full Family Communication: Updated patient.  No family at bedside. Disposition:   Status is: Inpatient    Dispo: The patient is from: Home              Anticipated d/c is to: Home              Anticipated d/c date is: 3 to 4 days              Patient currently on IV antibiotics, cultures pending, not stable for discharge.       Consultants:   General surgery: Dr. Dema Severin 02/09/2020  Procedures:   CT abdomen and pelvis 02/08/2020  Incision and drainage of abdominal wall abscess/soft tissue debridement of abdominal wall 10 x  15 cm down to the level of the fascia per Dr. Dema Severin 02/09/2020  Antimicrobials:   IV cefepime 02/09/2020>>>>  IV clindamycin 9/14/2021x1 dose  IV Flagyl 02/09/2020  IV Zosyn 02/08/2020>>> 02/09/2020  IV vancomycin 02/08/2020>>>>   Subjective: Laying in bed.  Feeling better than she did on admission.  Some improvement with abdominal pain postoperatively.  Patient however  complaining of soreness after wound site was looked at.  General surgery PA.  No shortness of breath.  No chest pain.  T-max 101.5 (9/14)  Objective: Vitals:   02/09/20 1516 02/09/20 1530 02/09/20 2114 02/10/20 0606  BP:   127/70 113/72  Pulse: 99 95 75 68  Resp: '16 14 19 16  ' Temp:  99.9 F (37.7 C) 97.8 F (36.6 C)   TempSrc:   Oral   SpO2: 96% 95% 100% 99%  Weight:      Height:        Intake/Output Summary (Last 24 hours) at 02/10/2020 1339 Last data filed at 02/10/2020 1332 Gross per 24 hour  Intake 1553.2 ml  Output 10 ml  Net 1543.2 ml   Filed Weights   02/08/20 1208 02/09/20 1255  Weight: 108.9 kg 108.9 kg    Examination:  General exam: Appears calm and comfortable  Respiratory system: Clear to auscultation. Respiratory effort normal. Cardiovascular system: S1 & S2 heard, RRR. No JVD, murmurs, rubs, gallops or clicks. No pedal edema. Gastrointestinal system: Abdomen is nondistended, soft and nontender.  Bandage noted on abdominal wall.  No organomegaly or masses felt. Normal bowel sounds heard. Central nervous system: Alert and oriented. No focal neurological deficits. Extremities: Symmetric 5 x 5 power. Skin: No rashes, lesions or ulcers Psychiatry: Judgement and insight appear normal. Mood & affect appropriate.          Data Reviewed: I have personally reviewed following labs and imaging studies  CBC: Recent Labs  Lab 02/08/20 1250 02/08/20 2315 02/09/20 0733 02/10/20 0538  WBC 16.7* 18.8* 14.3* 16.5*  NEUTROABS 13.9* 15.4*  --   --   HGB 10.8* 9.9* 10.0* 10.9*  HCT 33.4* 30.9* 30.3* 34.2*  MCV 82.1 84.0 81.9 83.2  PLT 187 201 199 951    Basic Metabolic Panel: Recent Labs  Lab 02/08/20 1250 02/08/20 2315 02/09/20 0733 02/10/20 0538  NA 129* 132* 134* 135  K 3.4* 3.4* 3.2* 3.7  CL 95* 99 100 101  CO2 22 20* 22 19*  GLUCOSE 411* 253* 235* 381*  BUN 7 <5* 5* 13  CREATININE 0.53 0.46 0.48 0.53  CALCIUM 8.3* 8.2* 8.0* 8.8*     GFR: Estimated Creatinine Clearance: 119.4 mL/min (by C-G formula based on SCr of 0.53 mg/dL).  Liver Function Tests: Recent Labs  Lab 02/08/20 1250 02/08/20 2315 02/09/20 0733 02/10/20 0538  AST '22 15 15 22  ' ALT '17 14 13 19  ' ALKPHOS 85 76 72 93  BILITOT 1.2 1.4* 1.7* 1.5*  PROT 7.7 7.1 6.6 7.6  ALBUMIN 2.8* 2.6* 2.2* 2.6*    CBG: Recent Labs  Lab 02/09/20 1501 02/09/20 1623 02/09/20 2116 02/10/20 0803 02/10/20 1246  GLUCAP 162* 171* 346* 367* 296*     Recent Results (from the past 240 hour(s))  SARS Coronavirus 2 by RT PCR (hospital order, performed in [facility] hospital lab) Nasopharyngeal Nasopharyngeal Swab     Status: None   Collection Time: 02/08/20  3:54 PM   Specimen: Nasopharyngeal Swab  Result Value Ref Range Status   SARS Coronavirus 2 NEGATIVE NEGATIVE Final    Comment: (  NOTE) SARS-CoV-2 target nucleic acids are NOT DETECTED.  The SARS-CoV-2 RNA is generally detectable in upper and lower respiratory specimens during the acute phase of infection. The lowest concentration of SARS-CoV-2 viral copies this assay can detect is 250 copies / mL. A negative result does not preclude SARS-CoV-2 infection and should not be used as the sole basis for treatment or other patient management decisions.  A negative result may occur with improper specimen collection / handling, submission of specimen other than nasopharyngeal swab, presence of viral mutation(s) within the areas targeted by this assay, and inadequate number of viral copies (<250 copies / mL). A negative result must be combined with clinical observations, patient history, and epidemiological information.  Fact Sheet for Patients:   StrictlyIdeas.no  Fact Sheet for Healthcare Providers: BankingDealers.co.za  This test is not yet approved or  cleared by the [place] FDA and has been authorized for detection and/or diagnosis of SARS-CoV-2 by FDA  under an Emergency Use Authorization (EUA).  This EUA will remain in effect (meaning this test can be used) for the duration of the COVID-19 declaration under Section 564(b)(1) of the Act, 21 U.S.C. section 360bbb-3(b)(1), unless the authorization is terminated or revoked sooner.  Performed at [facility], [address] [place], [place] [address]   Blood culture (routine x 2)     Status: None (Preliminary result)   Collection Time: 02/08/20  5:45 PM   Specimen: Right Antecubital; Blood  Result Value Ref Range Status   Specimen Description   Final    RIGHT ANTECUBITAL BLOOD Performed at [facility], [place] [address], [place], [place] [address]    Special Requests   Final    BOTTLES DRAWN AEROBIC AND ANAEROBIC Blood Culture adequate volume Performed at [facility], [address] [place], [place] [address]    Culture   Final    NO GROWTH < 24 HOURS Performed at [facility], [place] [address] [place], [place] [address]    Report Status PENDING  Incomplete  Aerobic Culture (superficial specimen)     Status: None (Preliminary result)   Collection Time: 02/09/20  9:44 AM   Specimen: Abdomen; Wound  Result Value Ref Range Status   Specimen Description   Final    ABDOMEN Performed at [place] [address], [place], [place] [address]    Special Requests   Final    Normal Performed at [facility], [place] [address], [place], [place] [address]    Gram Stain   Final    RARE WBC PRESENT, PREDOMINANTLY PMN ABUNDANT GRAM POSITIVE COCCI FEW GRAM NEGATIVE RODS    Culture   Final    CULTURE REINCUBATED FOR BETTER GROWTH Performed at [facility], [place] [address] [place], [place] [address]    Report Status PENDING  Incomplete  Aerobic/Anaerobic Culture (surgical/deep wound)     Status: None (Preliminary result)   Collection Time: 02/09/20  2:22 PM   Specimen: Abscess  Result Value Ref Range Status    Specimen Description   Final    ABSCESS ABDOMEN Performed at [place] [address], [place], [place] [address]    Special Requests   Final    NONE Performed at [facility], [place] [address], [place], [place] [address]    Gram Stain   Final    ABUNDANT WBC PRESENT, PREDOMINANTLY PMN ABUNDANT GRAM POSITIVE COCCI MODERATE GRAM VARIABLE  ROD FEW GRAM NEGATIVE COCCI    Culture   Final    NO GROWTH < 24 HOURS Performed at [facility], [place] [address] [place], [place] [address]    Report Status PENDING  Incomplete         Radiology Studies: CT ABDOMEN PELVIS W CONTRAST  Result Date: 02/08/2020 CLINICAL DATA:  Patient reports abscess to lower abdomen since Monday. Diabetes, area is red and swollen. EXAM: CT ABDOMEN AND PELVIS WITH CONTRAST TECHNIQUE: Multidetector CT imaging of the abdomen and pelvis was performed using the standard protocol following bolus administration of intravenous contrast. CONTRAST:  160m OMNIPAQUE IOHEXOL 300 MG/ML  SOLN COMPARISON:  Ultrasound abdomen 12/29/2012 FINDINGS: Lower chest: Linear atelectasis versus scarring within the left lower lobe. Pulmonary micronodule within the lingula (2:4). Pulmonary micronodule within the right lower lobe (2:9). Otherwise no acute abnormality. Hepatobiliary: Diffusely hypodense hepatic parenchyma compared to the splenic parenchyma suggestive of hepatic steatosis. Layering hyperdensity within the gallbladder lumen suggestive of sludge versus tiny gallstones. No biliary ductal dilatation. Pancreas: Unremarkable. No pancreatic ductal dilatation or surrounding inflammatory changes. Spleen: Normal in size without focal abnormality. A splenule is identified. Adrenals/Urinary Tract: No adrenal nodule bilaterally. Bilateral kidneys enhance symmetrically. No hydronephrosis. No hydroureter. The urinary bladder is unremarkable. Stomach/Bowel: Stomach is within normal limits. Appendix appears  normal. No evidence of bowel wall thickening, distention, or inflammatory changes. Vascular/Lymphatic: No abdominal aorta or iliac aneurysm. No abdominal, pelvic, or inguinal lymphadenopathy. Asymmetrically prominent but nonenlarged inguinal lymph nodes on the right measuring up to 1.1 cm (2:85) that are likely reactive in etiology. Reproductive: Uterus and bilateral adnexa are unremarkable. Other: No intraperitoneal free fluid. No intraperitoneal free gas. No organized fluid collection. Musculoskeletal: Anterior mid to lower abdominal wall subcutaneous soft tissue edema, right greater than left with no organized fluid collection but a region of soft tissue density representing phlegmon formation measuring up to 5 x 11 x 7 cm (2:65, 5:10). Several superficial foci of subcutaneous soft tissue gas are associated with this area ( 2:59-66). No suspicious lytic or blastic osseous lesions. No acute displaced fracture. IMPRESSION: A 5 x 11 x 7 cm right mid to lower anterior abdominal wall phlegmon formation with several associated foci of subcutaneous soft tissue gas. No drainable organized fluid collection. No intra-abdominal extension. Given findings and patient history, please note that a necrotizing fasciitis is not excluded as this is a clinical diagnosis. No intra-abdominal or intrapelvic abnormality. Electronically Signed   By: MIven FinnM.D.   On: 02/08/2020 16:05        Scheduled Meds: . enoxaparin (LOVENOX) injection  55 mg Subcutaneous Q24H  . insulin aspart  0-15 Units Subcutaneous TID WC  . insulin aspart  0-5 Units Subcutaneous QHS  . insulin glargine  16 Units Subcutaneous QHS   Continuous Infusions: . sodium chloride Stopped (02/10/20 1242)  . ceFEPime (MAXIPIME) IV Stopped (02/10/20 1159)  . metronidazole 100 mL/hr at 02/10/20 1332  . vancomycin Stopped (02/10/20 0601)     LOS: 2 days    Time spent: 35 minutes    DIrine Seal MD [facility]   To contact the  attending provider between 7A-7P or the covering provider during after hours 7P-7A, please log into the web site [email or web address] and access using universal [place] password for that web site. If you do not have the password, please call the hospital operator.  02/10/2020, 1:39 PM

## 2020-02-10 NOTE — Transfer of Care (Signed)
Immediate Anesthesia Transfer of Care Note  Patient: Haley [name]  Procedure(s) Performed: INCISION AND DRAINAGE ABDOMINAL WALL ABCESS (N/A )  Patient Location: PACU  Anesthesia Type:General  Level of Consciousness: awake, alert  and oriented  Airway & Oxygen Therapy: Patient Spontanous Breathing and Patient connected to face mask oxygen  Post-op Assessment: Report given to RN, Post -op Vital signs reviewed and stable and Patient moving all extremities  Post vital signs: Reviewed and stable  Last Vitals:  Vitals Value Taken Time  BP 113/72 02/10/20 0606  Temp 36.6 C 02/09/20 2114  Pulse 68 02/10/20 0606  Resp 12 02/10/20 0822  SpO2 99 % 02/10/20 0606  Vitals shown include unvalidated device data.  Last Pain:  Vitals:   02/10/20 0000  TempSrc:   PainSc: 0-No pain      Patients Stated Pain Goal: 0 (02/09/20 0356)  Complications: No complications documented.

## 2020-02-10 NOTE — Progress Notes (Addendum)
Inpatient Diabetes Program Recommendations  AACE/ADA: New Consensus Statement on Inpatient Glycemic Control (2015)  Target Ranges:  Prepandial:   less than 140 mg/dL      Peak postprandial:   less than 180 mg/dL (1-2 hours)      Critically ill patients:  140 - 180 mg/dL   Lab Results  Component Value Date   GLUCAP 367 (H) 02/10/2020   HGBA1C 10.8 (H) 02/08/2020    Review of Glycemic Control Results for BRIONA, KORPELA (MRN [id number]) as of 02/09/2020 11:10  Ref. Range 02/08/2020 12:27 02/08/2020 20:19 02/08/2020 21:50 02/09/2020 07:56  Glucose-Capillary Latest Ref Range: 70 - 99 mg/dL 387 (H) 276 (H) 260 (H) 231 (H)    abdominal wall abscess Diabetes history: DM 2 Outpatient Diabetes medications: Glipizide 5 mg bid, Metformin 1000 mg bid Current orders for Inpatient glycemic control:  Lantus 12 units Novolog 0-15 units tid + hs  A1c 10.8% this admission down from 12.6% on 6/17  May benefit from insulin at time of d/c. BC[employer] insurance  Noted decadron 10 mg given yesterday  Inpatient Diabetes Program Recommendations:    Increase Lantus to 20 units.   Will speak with pt today regarding insulin at d/c.  Addendum 1118 am: Spoke with pt at bedside regarding A1c level and insulin at time of d/c. Pt had questions about how big her surgical site was and why her glucose trends were high. Explained how decadron can increase glucose levels. Pt also was wondering about how much longer she is needed in the hospital.  Pt has been wanting to be on insulin for awhile but PCP was resistant wanting her to be on metformin and eventually glipizide, however pt had been on metformin for a few years. Showed pt how to use insulin pen. Pt had been on Trulicity injection in the past (similar operation). Pt asked if she had to stay on insulin once it is prescribed. I told her its possible to come off, however while healing from wound will need to for now.   Lantus copay card link emailed to pt to fill out  for d/c.  D/C needs: Glucose meter kit order #[id number] Lantus solostar insulin pen order # [phone] (Copay card link emailed to pt) Insulin pen needles order # [id number]  Tama Headings RN, MSN, BC-ADM Inpatient Diabetes Coordinator Team Pager [phone] (8a-5p)

## 2020-02-11 DIAGNOSIS — E1165 Type 2 diabetes mellitus with hyperglycemia: Secondary | ICD-10-CM

## 2020-02-11 LAB — AEROBIC/ANAEROBIC CULTURE W GRAM STAIN (SURGICAL/DEEP WOUND)

## 2020-02-11 LAB — CBC WITH DIFFERENTIAL/PLATELET
Abs Immature Granulocytes: 0.14 10*3/uL — ABNORMAL HIGH (ref 0.00–0.07)
Basophils Absolute: 0 10*3/uL (ref 0.0–0.1)
Basophils Relative: 0 %
Eosinophils Absolute: 0 10*3/uL (ref 0.0–0.5)
Eosinophils Relative: 0 %
HCT: 27.8 % — ABNORMAL LOW (ref 36.0–46.0)
Hemoglobin: 9 g/dL — ABNORMAL LOW (ref 12.0–15.0)
Immature Granulocytes: 1 %
Lymphocytes Relative: 19 %
Lymphs Abs: 2.4 10*3/uL (ref 0.7–4.0)
MCH: 26.7 pg (ref 26.0–34.0)
MCHC: 32.4 g/dL (ref 30.0–36.0)
MCV: 82.5 fL (ref 80.0–100.0)
Monocytes Absolute: 0.7 10*3/uL (ref 0.1–1.0)
Monocytes Relative: 5 %
Neutro Abs: 9.4 10*3/uL — ABNORMAL HIGH (ref 1.7–7.7)
Neutrophils Relative %: 75 %
Platelets: 280 10*3/uL (ref 150–400)
RBC: 3.37 MIL/uL — ABNORMAL LOW (ref 3.87–5.11)
RDW: 13 % (ref 11.5–15.5)
WBC: 12.7 10*3/uL — ABNORMAL HIGH (ref 4.0–10.5)
nRBC: 0 % (ref 0.0–0.2)

## 2020-02-11 LAB — COMPREHENSIVE METABOLIC PANEL
ALT: 15 U/L (ref 0–44)
AST: 18 U/L (ref 15–41)
Albumin: 2.1 g/dL — ABNORMAL LOW (ref 3.5–5.0)
Alkaline Phosphatase: 66 U/L (ref 38–126)
Anion gap: 9 (ref 5–15)
BUN: 11 mg/dL (ref 6–20)
CO2: 21 mmol/L — ABNORMAL LOW (ref 22–32)
Calcium: 8.2 mg/dL — ABNORMAL LOW (ref 8.9–10.3)
Chloride: 104 mmol/L (ref 98–111)
Creatinine, Ser: 0.39 mg/dL — ABNORMAL LOW (ref 0.44–1.00)
GFR calc Af Amer: >60 mL/min (ref >60)
GFR calc non Af Amer: >60 mL/min (ref >60)
Glucose, Bld: 274 mg/dL — ABNORMAL HIGH (ref 70–99)
Potassium: 3.3 mmol/L — ABNORMAL LOW (ref 3.5–5.1)
Sodium: 134 mmol/L — ABNORMAL LOW (ref 135–145)
Total Bilirubin: 0.9 mg/dL (ref 0.3–1.2)
Total Protein: 6.5 g/dL (ref 6.5–8.1)

## 2020-02-11 LAB — GLUCOSE, CAPILLARY
Glucose-Capillary: 272 mg/dL — ABNORMAL HIGH (ref 70–99)
Glucose-Capillary: 225 mg/dL — ABNORMAL HIGH (ref 70–99)
Glucose-Capillary: 180 mg/dL — ABNORMAL HIGH (ref 70–99)
Glucose-Capillary: 123 mg/dL — ABNORMAL HIGH (ref 70–99)
Glucose-Capillary: 168 mg/dL — ABNORMAL HIGH (ref 70–99)

## 2020-02-11 LAB — TROPONIN I (HIGH SENSITIVITY)
Troponin I (High Sensitivity): 3 ng/L (ref <18)
Troponin I (High Sensitivity): 3 ng/L (ref <18)
Troponin I (High Sensitivity): 3 ng/L (ref <18)

## 2020-02-11 LAB — AEROBIC CULTURE W GRAM STAIN (SUPERFICIAL SPECIMEN): Special Requests: NORMAL

## 2020-02-11 MED ORDER — OXYCODONE-ACETAMINOPHEN 5-325 MG PO TABS
1.0000 | ORAL_TABLET | ORAL | Status: DC | PRN
  Administered 2020-02-11 – 2020-02-13 (×3): 1 via ORAL
  Administered 2020-02-13 – 2020-02-15 (×4): 2 via ORAL
  Filled 2020-02-11 (×5): qty 2
  Filled 2020-02-11: qty 1
  Filled 2020-02-11 (×2): qty 2

## 2020-02-11 MED ORDER — MORPHINE SULFATE (PF) 2 MG/ML IV SOLN
2.0000 mg | INTRAVENOUS | Status: DC | PRN
  Administered 2020-02-11 – 2020-02-13 (×4): 2 mg via INTRAVENOUS
  Filled 2020-02-11 (×4): qty 1

## 2020-02-11 MED ORDER — POTASSIUM CHLORIDE CRYS ER 20 MEQ PO TBCR
40.0000 meq | EXTENDED_RELEASE_TABLET | Freq: Once | ORAL | Status: AC
  Administered 2020-02-11: 40 meq via ORAL
  Filled 2020-02-11: qty 2

## 2020-02-11 MED ORDER — INSULIN GLARGINE 100 UNIT/ML ~~LOC~~ SOLN
24.0000 [IU] | Freq: Every day | SUBCUTANEOUS | Status: DC
  Administered 2020-02-11 – 2020-02-15 (×5): 24 [IU] via SUBCUTANEOUS
  Filled 2020-02-11 (×6): qty 0.24

## 2020-02-11 MED ORDER — INSULIN ASPART 100 UNIT/ML ~~LOC~~ SOLN
6.0000 [IU] | Freq: Three times a day (TID) | SUBCUTANEOUS | Status: DC
  Administered 2020-02-12 – 2020-02-16 (×12): 6 [IU] via SUBCUTANEOUS

## 2020-02-11 MED ORDER — INSULIN ASPART 100 UNIT/ML ~~LOC~~ SOLN
6.0000 [IU] | Freq: Three times a day (TID) | SUBCUTANEOUS | Status: DC
  Administered 2020-02-11: 6 [IU] via SUBCUTANEOUS

## 2020-02-11 MED ORDER — SODIUM CHLORIDE 0.9 % IV SOLN
3.0000 g | Freq: Four times a day (QID) | INTRAVENOUS | Status: DC
  Administered 2020-02-11 – 2020-02-16 (×17): 3 g via INTRAVENOUS
  Filled 2020-02-11: qty 3
  Filled 2020-02-11: qty 8
  Filled 2020-02-11 (×2): qty 3
  Filled 2020-02-11 (×2): qty 8
  Filled 2020-02-11 (×4): qty 3
  Filled 2020-02-11 (×2): qty 8
  Filled 2020-02-11: qty 3
  Filled 2020-02-11 (×2): qty 8
  Filled 2020-02-11: qty 3
  Filled 2020-02-11: qty 8
  Filled 2020-02-11 (×3): qty 3
  Filled 2020-02-11: qty 8
  Filled 2020-02-11 (×2): qty 3

## 2020-02-11 MED ORDER — LORAZEPAM 2 MG/ML IJ SOLN
0.5000 mg | Freq: Once | INTRAMUSCULAR | Status: AC
  Administered 2020-02-11: 0.5 mg via INTRAVENOUS
  Filled 2020-02-11: qty 1

## 2020-02-11 MED ORDER — INSULIN GLARGINE 100 UNIT/ML ~~LOC~~ SOLN
4.0000 [IU] | Freq: Once | SUBCUTANEOUS | Status: AC
  Administered 2020-02-11: 4 [IU] via SUBCUTANEOUS
  Filled 2020-02-11: qty 0.04

## 2020-02-11 NOTE — Progress Notes (Addendum)
PROGRESS NOTE    Haley [name]  MRN:[id number] DOB: 10/14/1985 DOA: 02/08/2020 PCP: Cody, Jessica R, MD    Chief Complaint  Patient presents with  . Abscess    Brief Narrative:  Haley Edwardsis a 34 y.o.femalewithDM noncompliant with metformin, h/o hidradenitis suppurativa, bipolar disorderpresented to [facility] 02/08/20 with developingworsening boil and swellingon right lower abdomen.   Patient reports that she first noticed a boil in her right lower abdomen almost a week ago. She used leftover doxycycline 100 mg twice daily for 4 days without any significant improvement in boil that kept growing in size with surrounding redness and swelling. Also tried"boil ease","draining salve"that she bought through [employer] without any significant improvement. Been using OTC lidocaine for pain relief.  She now reports fever chills and generalized malaise. She denies any history of MRSA infection reports that last skin infection was in her left axilla that was treated with Keflex in July 2021.  Novomiting or diarrhea.   She is currently single without any children. Smokes 2 cigars daily for past 15 years. Denies any drinking or recreational drug use. She currently works at [employer].  Family history significant for mother with DM and CHF. Dad also has diabetes. Otherwise unremarkable.  Patient seen by general surgery, underwent incision and drainage, cultures pending, placed on IV antibiotics.    Assessment & Plan:   Active Problems:   Hidradenitis suppurativa   Cellulitis   Sepsis (HCC)   Lactic acid acidosis   Hypoalbuminemia   Hyperglycemia   Iron deficiency anemia due to chronic blood loss   Cellulitis of right abdominal wall  1 severe sepsis secondary to abdominal wall abscess/cellulitis, POA On admission patient met criteria for severe sepsis with fever, tachycardia, leukocytosis, lactic acid level of 2.4.  Patient with clinical improvement.  Patient seen in  consultation by general surgery patient subsequently underwent incision and drainage of abdominal wall abscess and soft tissue debridement of abdominal wall 10 x 15 cm down to the level of the fascia per general surgery on 02/09/2020 with cultures pending.  Continue current regimen of IV vancomycin, IV cefepime, IV Flagyl.  Continue current dressing changes as recommended by general surgery.  General surgery recommending probable wound VAC placement tomorrow.  Consult with ID for antibiotic recommendations and duration.  Per general surgery.   2.  Poorly controlled diabetes mellitus type 2 Hemoglobin A1c 10.8 (02/08/2020).  CBG in 272 this morning.  Increase Lantus to 24 units daily.  Patient received Lantus 20 units last night we will give Lantus 4 units this morning.  Placed on NovoLog 6 units 3 times daily meal coverage.  Continue sliding scale insulin.  Will likely need to go home on insulin.  Continue to hold oral hypoglycemic agents.  Diabetes coordinator following.  Will need close outpatient diabetes follow-up/management.   3.  Iron deficiency anemia/anemia of chronic disease Hemoglobin currently stable at 9.0.  Patient with no overt bleeding.  Follow H&H.  Transfusion threshold hemoglobin < 7.  4.  Hypokalemia Replete.    DVT prophylaxis: Lovenox Code Status: Full Family Communication: Updated patient.  No family at bedside. Disposition:   Status is: Inpatient    Dispo: The patient is from: Home              Anticipated d/c is to: Home              Anticipated d/c date is: 3 to 4 days              Patient   currently on IV antibiotics, cultures pending, likely to have a wound VAC placed tomorrow, not stable for discharge.       Consultants:   General surgery: Dr. Dema Severin 02/09/2020  ID pending  Procedures:   CT abdomen and pelvis 02/08/2020  Incision and drainage of abdominal wall abscess/soft tissue debridement of abdominal wall 10 x 15 cm down to the level of the fascia  per Dr. Dema Severin 02/09/2020  Antimicrobials:   IV cefepime 02/09/2020>>>>  IV clindamycin 9/14/2021x1 dose  IV Flagyl 02/09/2020  IV Zosyn 02/08/2020>>> 02/09/2020  IV vancomycin 02/08/2020>>>>   Subjective: Patient sleeping but arousable.  Denies any chest pain or shortness of breath.  Continue to feel better.  Stated did not sleep well last night.  Noted to be ambulating in the hallway earlier on this morning.    Objective: Vitals:   02/10/20 0606 02/10/20 2131 02/11/20 0433 02/11/20 0445  BP: 113/72 120/64 116/67 115/78  Pulse: 68 72 65   Resp: _0 Temp:  98 F (36.7 C) 97.6 F (36.4 C) 97.6 F (36.4 C)  TempSrc:  Oral  Oral  SpO2: 99% 99%  99%  Weight:      Height:        Intake/Output Summary (Last 24 hours) at 02/11/2020 1250 Last data filed at 02/10/2020 2200 Gross per 24 hour  Intake 621.33 ml  Output --  Net 621.33 ml   Filed Weights   02/08/20 1208 02/09/20 1255  Weight: 108.9 kg 108.9 kg    Examination:  General exam: NAD Respiratory system: CTAB.  No wheezes, no crackles, no rhonchi.  Normal respiratory effort.  Cardiovascular system: Regular rate rhythm no murmurs rubs or gallops.  No JVD.  No lower extremity edema.  Gastrointestinal system: Abdomen is soft, nontender, nondistended, positive bowel sounds.  Bandage on abdominal wall.  No rebound.  No guarding.  Central nervous system: Alert and oriented. No focal neurological deficits. Extremities: Symmetric 5 x 5 power. Skin: No rashes, lesions or ulcers Psychiatry: Judgement and insight appear normal. Mood & affect appropriate.          Data Reviewed: I have personally reviewed following labs and imaging studies  CBC: Recent Labs  Lab 02/08/20 1250 02/08/20 2315 02/09/20 0733 02/10/20 0538 02/11/20 0553  WBC 16.7* 18.8* 14.3* 16.5* 12.7*  NEUTROABS 13.9* 15.4*  --   --  9.4*  HGB 10.8* 9.9* 10.0* 10.9* 9.0*  HCT 33.4* 30.9* 30.3* 34.2* 27.8*  MCV 82.1 84.0 81.9 83.2 82.5  PLT  187 201 199 277 322    Basic Metabolic Panel: Recent Labs  Lab 02/08/20 1250 02/08/20 2315 02/09/20 0733 02/10/20 0538 02/11/20 0553  NA 129* 132* 134* 135 134*  K 3.4* 3.4* 3.2* 3.7 3.3*  CL 95* 99 100 101 104  CO2 22 20* 22 19* 21*  GLUCOSE 411* 253* 235* 381* 274*  BUN 7 <5* 5* 13 11  CREATININE 0.53 0.46 0.48 0.53 0.39*  CALCIUM 8.3* 8.2* 8.0* 8.8* 8.2*    GFR: Estimated Creatinine Clearance: 119.4 mL/min (A) (by C-G formula based on SCr of 0.39 mg/dL (L)).  Liver Function Tests: Recent Labs  Lab 02/08/20 1250 02/08/20 2315 02/09/20 0733 02/10/20 0538 02/11/20 0553  AST _1 ALT _2 ALKPHOS 85 76 72 93 66  BILITOT 1.2 1.4* 1.7* 1.5* 0.9  PROT 7.7 7.1 6.6 7.6 6.5  ALBUMIN 2.8* 2.6* 2.2* 2.6* 2.1*    CBG: Recent Labs  Lab 02/10/20 1610 02/10/20 2134 02/11/20 0446 02/11/20 0747 02/11/20 1130  GLUCAP 290* 357* 272* 225* 180*     Recent Results (from the past 240 hour(s))  SARS Coronavirus 2 by RT PCR (hospital order, performed in [place] hospital lab) Nasopharyngeal Nasopharyngeal Swab     Status: None   Collection Time: 02/08/20  3:54 PM   Specimen: Nasopharyngeal Swab  Result Value Ref Range Status   SARS Coronavirus 2 NEGATIVE NEGATIVE Final    Comment: (NOTE) SARS-CoV-2 target nucleic acids are NOT DETECTED.  The SARS-CoV-2 RNA is generally detectable in upper and lower respiratory specimens during the acute phase of infection. The lowest concentration of SARS-CoV-2 viral copies this assay can detect is 250 copies / mL. A negative result does not preclude SARS-CoV-2 infection and should not be used as the sole basis for treatment or other patient management decisions.  A negative result may occur with improper specimen collection / handling, submission of specimen other than nasopharyngeal swab, presence of viral mutation(s) within the areas targeted by this assay, and inadequate number of viral copies (<250 copies /  mL). A negative result must be combined with clinical observations, patient history, and epidemiological information.  Fact Sheet for Patients:   https://www.fda.gov/media/136312/download  Fact Sheet for Healthcare Providers: https://www.fda.gov/media/136313/download  This test is not yet approved or  cleared by the [place] FDA and has been authorized for detection and/or diagnosis of SARS-CoV-2 by FDA under an Emergency Use Authorization (EUA).  This EUA will remain in effect (meaning this test can be used) for the duration of the COVID-19 declaration under Section 564(b)(1) of the Act, 21 U.S.C. section 360bbb-3(b)(1), unless the authorization is terminated or revoked sooner.  Performed at [facility] [place], [address] [place], [place] [address]   Blood culture (routine x 2)     Status: None (Preliminary result)   Collection Time: 02/08/20  5:45 PM   Specimen: Right Antecubital; Blood  Result Value Ref Range Status   Specimen Description   Final    RIGHT ANTECUBITAL BLOOD Performed at [facility], [address] [place], [place] [address]    Special Requests   Final    BOTTLES DRAWN AEROBIC AND ANAEROBIC Blood Culture adequate volume Performed at [facility] [place], [address] [place], [place] [address]    Culture   Final    NO GROWTH 2 DAYS Performed at [facility], [address] [place], [place] [address]    Report Status PENDING  Incomplete  Aerobic Culture (superficial specimen)     Status: Abnormal   Collection Time: 02/09/20  9:44 AM   Specimen: Abdomen; Wound  Result Value Ref Range Status   Specimen Description   Final    ABDOMEN Performed at [facility], [address] [place], [place] [address]    Special Requests   Final    Normal Performed at [facility], [address] [place], [place] [address]    Gram Stain   Final    RARE WBC PRESENT, PREDOMINANTLY PMN ABUNDANT  GRAM POSITIVE COCCI FEW GRAM NEGATIVE RODS    Culture (A)  Final    MULTIPLE ORGANISMS PRESENT, NONE PREDOMINANT NO STAPHYLOCOCCUS AUREUS ISOLATED NO GROUP A STREP (S.PYOGENES) ISOLATED Performed at [facility], [address] , [place] [address]    Report Status 02/11/2020 FINAL  Final  Aerobic/Anaerobic Culture (surgical/deep wound)     Status: None (Preliminary   result)   Collection Time: 02/09/20  2:22 PM   Specimen: Abscess  Result Value Ref Range Status   Specimen Description   Final    ABSCESS ABDOMEN Performed at [facility] [address] [place], [place] [address]    Special Requests   Final    NONE Performed at [facility], [place] [address], [place], [place] [address]    Gram Stain   Final    ABUNDANT WBC PRESENT, PREDOMINANTLY PMN ABUNDANT GRAM POSITIVE COCCI MODERATE GRAM VARIABLE ROD FEW GRAM NEGATIVE COCCI    Culture   Final    FEW ACTINOMYCES NEUII Standardized susceptibility testing for this organism is not available. Performed at [facility], [place] [address], [place], [place] [address]    Report Status PENDING  Incomplete         Radiology Studies: No results found.      Scheduled Meds: . enoxaparin (LOVENOX) injection  55 mg Subcutaneous Q24H  . insulin aspart  0-15 Units Subcutaneous TID WC  . insulin aspart  0-5 Units Subcutaneous QHS  . insulin aspart  6 Units Subcutaneous TID WC  . insulin aspart  6 Units Subcutaneous TID WC  . insulin glargine  24 Units Subcutaneous QHS   Continuous Infusions: . sodium chloride Stopped (02/10/20 1242)  . ceFEPime (MAXIPIME) IV 2 g (02/11/20 0956)  . metronidazole 500 mg (02/11/20 1203)  . vancomycin 1,000 mg (02/11/20 0409)     LOS: 3 days    Time spent: 35 minutes    Irine Seal, MD [facility]   To contact the attending provider between 7A-7P or the covering provider during after hours 7P-7A, please log into the web  site [email or web address] and access using universal [place] password for that web site. If you do not have the password, please call the hospital operator.  02/11/2020, 12:50 PM

## 2020-02-11 NOTE — Progress Notes (Signed)
Rapid Response Event Note   Reason for Call :  Patient compliant of sternal chest pain radiating to back.    Initial Focused Assessment:  Patient alert/ oriented x 4, no distress noted. Patient reports that she is having intermittent  substernal chest pain    that radiates to her back, she also reports that she had a sharp pain from wound on her abdonmina that made the chest pain worse.   VSS see flow sheet.  Primary nurse reports that this also happen yesterday morning, EKG completed, shows NSR.        Interventions:  Notified Provider Troponin's x 3  Morphine x 1 dose Ativan x 1 dose.  EKG   Plan of Care:   Continue to monitor, remains of cardiac monitoring, vss as ordered, troponin's per order.    MD Notified:  [facility] FNP Call Time:0440 Arrival Time:0450 End Time:0500  Sharyn Lull Laymon Stockert, RN

## 2020-02-11 NOTE — Consult Note (Signed)
[facility]    Date of Admission:  02/08/2020   Total days of antibiotics: 3 cefepime, flagyl, vanco               Reason for Consult: abd wall abscess    Referring Provider: Thompson   Assessment: Abd wall abscess Uncontrolled DM (A1C 10.8) HIdradenitis   Plan: 1. Await further cx results 2. Change anbx to unasyn 3. Can change to augmentin at d/c 4. Control Dm 5. Consider f/u imaging 6. F/u with derm for tx of hidradenitis (not currently active)  Comment- Actino is associated with dirty wounds, "lumpy jaw", IUDs, animal bites (which she denies). The montra for Actino is "inside out", that often then infections start interiorly and then erode outward.  Prolonged treatment is the rule (6 months) and repeat imaging would be useful to make sure she does not have GI lesion.   Thank you so much for this truly fascinating consult,  Active Problems:   Hidradenitis suppurativa   Cellulitis   Sepsis (HCC)   Lactic acid acidosis   Hypoalbuminemia   Hyperglycemia   Iron deficiency anemia due to chronic blood loss   Cellulitis of right abdominal wall   . enoxaparin (LOVENOX) injection  55 mg Subcutaneous Q24H  . insulin aspart  0-15 Units Subcutaneous TID WC  . insulin aspart  0-5 Units Subcutaneous QHS  . insulin aspart  6 Units Subcutaneous TID WC  . insulin aspart  6 Units Subcutaneous TID WC  . insulin glargine  24 Units Subcutaneous QHS    HPI: Haley [name] is a 34 y.o. female with hx of DM2 (dx 2012, non-compliance), hidradenitis, bipolar, comes to ED on 9-13 with a boil on her R lower abd.that had been present for 1 week. She had taken 4 days of doxy pta without improvement. In ED she c/o fever, chills, malaise.  In ED her temp was 101.5, WBC 16.7. She was started on vanco/clinda/zosyn. Her CT showed 5 x 11 x 7 cm phlegmon with sq gas.  She was taken to OR on 9-14 and had I & D of abscess. (10 x 15 cm).  Today her WBC is 12.7 and she  is afebrile. Her g/s shows multiple organisms and her Cx grew Actinomyces neuii.   Review of Systems: Review of Systems  Constitutional: Positive for chills and fever. Negative for weight loss.  Eyes: Negative for blurred vision.  Gastrointestinal: Positive for constipation. Negative for diarrhea.  Genitourinary: Negative for dysuria.  Neurological: Negative for sensory change.  Please see HPI. All other systems reviewed and negative.   Past Medical History:  Diagnosis Date  . Diabetes mellitus   . Hidradenitis suppurativa   . Psychosis (HCC) 12/16/2018    Social History   Tobacco Use  . Smoking status: Current Every Day Smoker    Years: 15.00    Types: Cigars  . Smokeless tobacco: Never Used  . Tobacco comment: 1-2 cigars per day  Substance Use Topics  . Alcohol use: Yes    Comment: wine a few times a month  . Drug use: Not Currently    Comment: previous MJ    Family History  Problem Relation Age of Onset  . Drug abuse Mother   . Cancer Father        unknown type  . Kidney failure Father        s/p transplant     Medications:  Scheduled: . enoxaparin (LOVENOX)  injection  55 mg Subcutaneous Q24H  . insulin aspart  0-15 Units Subcutaneous TID WC  . insulin aspart  0-5 Units Subcutaneous QHS  . insulin aspart  6 Units Subcutaneous TID WC  . insulin aspart  6 Units Subcutaneous TID WC  . insulin glargine  24 Units Subcutaneous QHS    Abtx:  Anti-infectives (From admission, onward)   Start     Dose/Rate Route Frequency Ordered Stop   02/09/20 1600  metroNIDAZOLE (FLAGYL) IVPB 500 mg        500 mg 100 mL/hr over 60 Minutes Intravenous Every 8 hours 02/09/20 1021     02/09/20 1600  ceFEPIme (MAXIPIME) 2 g in sodium chloride 0.9 % 100 mL IVPB        2 g 200 mL/hr over 30 Minutes Intravenous Every 8 hours 02/09/20 1021     02/09/20 0615  clindamycin (CLEOCIN) IVPB 600 mg  Status:  Discontinued        600 mg 100 mL/hr over 30 Minutes Intravenous Every 8 hours  02/09/20 0607 02/09/20 1021   02/08/20 1800  piperacillin-tazobactam (ZOSYN) IVPB 3.375 g  Status:  Discontinued        3.375 g 12.5 mL/hr over 240 Minutes Intravenous Every 8 hours 02/08/20 1625 02/09/20 1021   02/08/20 1615  clindamycin (CLEOCIN) IVPB 600 mg        600 mg 100 mL/hr over 30 Minutes Intravenous  Once 02/08/20 1614 02/08/20 1809   02/08/20 1600  vancomycin (VANCOCIN) IVPB 1000 mg/200 mL premix        1,000 mg 200 mL/hr over 60 Minutes Intravenous Every 12 hours 02/08/20 1559          OBJECTIVE: Blood pressure 113/67, pulse 80, temperature 98.1 F (36.7 C), temperature source Oral, resp. rate 20, height 5\' 5"  (1.651 m), weight 108.9 kg, last menstrual period 01/25/2020, SpO2 100 %.  Physical Exam Vitals reviewed.  Constitutional:      Appearance: Normal appearance. She is obese.  HENT:     Mouth/Throat:     Mouth: Mucous membranes are moist.     Pharynx: No oropharyngeal exudate.  Eyes:     Extraocular Movements: Extraocular movements intact.     Pupils: Pupils are equal, round, and reactive to light.  Cardiovascular:     Rate and Rhythm: Normal rate and regular rhythm.  Pulmonary:     Effort: Pulmonary effort is normal.     Breath sounds: Normal breath sounds.  Abdominal:     General: Bowel sounds are normal.     Palpations: Abdomen is soft.     Tenderness: There is no abdominal tenderness. There is no guarding.    Musculoskeletal:     Cervical back: Normal range of motion and neck supple.     Right lower leg: No edema.     Left lower leg: No edema.  Feet:     Right foot:     Skin integrity: Skin integrity normal. No ulcer.     Left foot:     Skin integrity: Skin integrity normal. No ulcer.  Neurological:     Mental Status: She is alert.     Lab Results Results for orders placed or performed during the hospital encounter of 02/08/20 (from the past 48 hour(s))  Glucose, capillary     Status: Abnormal   Collection Time: 02/09/20  4:23 PM   Result Value Ref Range   Glucose-Capillary 171 (H) 70 - 99 mg/dL    Comment: Glucose reference range  applies only to samples taken after fasting for at least 8 hours.  Glucose, capillary     Status: Abnormal   Collection Time: 02/09/20  9:16 PM  Result Value Ref Range   Glucose-Capillary 346 (H) 70 - 99 mg/dL    Comment: Glucose reference range applies only to samples taken after fasting for at least 8 hours.   Comment 1 Notify RN   Comprehensive metabolic panel     Status: Abnormal   Collection Time: 02/10/20  5:38 AM  Result Value Ref Range   Sodium 135 135 - 145 mmol/L   Potassium 3.7 3.5 - 5.1 mmol/L   Chloride 101 98 - 111 mmol/L   CO2 19 (L) 22 - 32 mmol/L   Glucose, Bld 381 (H) 70 - 99 mg/dL    Comment: Glucose reference range applies only to samples taken after fasting for at least 8 hours.   BUN 13 6 - 20 mg/dL   Creatinine, Ser 2.77 0.44 - 1.00 mg/dL   Calcium 8.8 (L) 8.9 - 10.3 mg/dL   Total Protein 7.6 6.5 - 8.1 g/dL   Albumin 2.6 (L) 3.5 - 5.0 g/dL   AST 22 15 - 41 U/L   ALT 19 0 - 44 U/L   Alkaline Phosphatase 93 38 - 126 U/L   Total Bilirubin 1.5 (H) 0.3 - 1.2 mg/dL   GFR calc non Af Amer >60 >60 mL/min   GFR calc Af Amer >60 >60 mL/min   Anion gap 15 5 - 15    Comment: Performed at [facility], [address], [place], [place] [address]  CBC     Status: Abnormal   Collection Time: 02/10/20  5:38 AM  Result Value Ref Range   WBC 16.5 (H) 4.0 - 10.5 K/uL   RBC 4.11 3.87 - 5.11 MIL/uL   Hemoglobin 10.9 (L) 12.0 - 15.0 g/dL   HCT 53.6 (L) 36 - 46 %   MCV 83.2 80.0 - 100.0 fL   MCH 26.5 26.0 - 34.0 pg   MCHC 31.9 30.0 - 36.0 g/dL   RDW 14.4 31.5 - 40.0 %   Platelets 277 150 - 400 K/uL   nRBC 0.0 0.0 - 0.2 %    Comment: Performed at [facility], [address], [place], [place] [address]  Glucose, capillary     Status: Abnormal   Collection Time: 02/10/20  8:03 AM  Result Value Ref Range   Glucose-Capillary 367 (H) 70  - 99 mg/dL    Comment: Glucose reference range applies only to samples taken after fasting for at least 8 hours.  Glucose, capillary     Status: Abnormal   Collection Time: 02/10/20 12:46 PM  Result Value Ref Range   Glucose-Capillary 296 (H) 70 - 99 mg/dL    Comment: Glucose reference range applies only to samples taken after fasting for at least 8 hours.  Glucose, capillary     Status: Abnormal   Collection Time: 02/10/20  4:10 PM  Result Value Ref Range   Glucose-Capillary 290 (H) 70 - 99 mg/dL    Comment: Glucose reference range applies only to samples taken after fasting for at least 8 hours.  Glucose, capillary     Status: Abnormal   Collection Time: 02/10/20  9:34 PM  Result Value Ref Range   Glucose-Capillary 357 (H) 70 - 99 mg/dL    Comment: Glucose reference range applies only to samples taken after fasting for at least 8 hours.  Comment 1 Notify RN    Comment 2 Document in Chart   Glucose, capillary     Status: Abnormal   Collection Time: 02/11/20  4:46 AM  Result Value Ref Range   Glucose-Capillary 272 (H) 70 - 99 mg/dL    Comment: Glucose reference range applies only to samples taken after fasting for at least 8 hours.  Comprehensive metabolic panel     Status: Abnormal   Collection Time: 02/11/20  5:53 AM  Result Value Ref Range   Sodium 134 (L) 135 - 145 mmol/L   Potassium 3.3 (L) 3.5 - 5.1 mmol/L   Chloride 104 98 - 111 mmol/L   CO2 21 (L) 22 - 32 mmol/L   Glucose, Bld 274 (H) 70 - 99 mg/dL    Comment: Glucose reference range applies only to samples taken after fasting for at least 8 hours.   BUN 11 6 - 20 mg/dL   Creatinine, Ser 8.46 (L) 0.44 - 1.00 mg/dL   Calcium 8.2 (L) 8.9 - 10.3 mg/dL   Total Protein 6.5 6.5 - 8.1 g/dL   Albumin 2.1 (L) 3.5 - 5.0 g/dL   AST 18 15 - 41 U/L   ALT 15 0 - 44 U/L   Alkaline Phosphatase 66 38 - 126 U/L   Total Bilirubin 0.9 0.3 - 1.2 mg/dL   GFR calc non Af Amer >60 >60 mL/min   GFR calc Af Amer >60 >60 mL/min   Anion gap  9 5 - 15    Comment: Performed at [facility], [address] [place], [place] [address]  CBC with Differential/Platelet     Status: Abnormal   Collection Time: 02/11/20  5:53 AM  Result Value Ref Range   WBC 12.7 (H) 4.0 - 10.5 K/uL   RBC 3.37 (L) 3.87 - 5.11 MIL/uL   Hemoglobin 9.0 (L) 12.0 - 15.0 g/dL   HCT 28.4 (L) 36 - 46 %   MCV 82.5 80.0 - 100.0 fL   MCH 26.7 26.0 - 34.0 pg   MCHC 32.4 30.0 - 36.0 g/dL   RDW 13.2 44.0 - 10.2 %   Platelets 280 150 - 400 K/uL   nRBC 0.0 0.0 - 0.2 %   Neutrophils Relative % 75 %   Neutro Abs 9.4 (H) 1.7 - 7.7 K/uL   Lymphocytes Relative 19 %   Lymphs Abs 2.4 0.7 - 4.0 K/uL   Monocytes Relative 5 %   Monocytes Absolute 0.7 0 - 1 K/uL   Eosinophils Relative 0 %   Eosinophils Absolute 0.0 0 - 0 K/uL   Basophils Relative 0 %   Basophils Absolute 0.0 0 - 0 K/uL   Immature Granulocytes 1 %   Abs Immature Granulocytes 0.14 (H) 0.00 - 0.07 K/uL    Comment: Performed at [facility], [address], [place], [place] [address]  Troponin I (High Sensitivity)     Status: None   Collection Time: 02/11/20  5:53 AM  Result Value Ref Range   Troponin I (High Sensitivity) 3 <18 ng/L    Comment: (NOTE) Elevated high sensitivity troponin I (hsTnI) values and significant  changes across serial measurements may suggest ACS but many other  chronic and acute conditions are known to elevate hsTnI results.  Refer to the "Links" section for chest pain algorithms and additional  guidance. Performed at [facility], [address], [place], [place] [address]   Troponin I (High Sensitivity)     Status: None  Collection Time: 02/11/20  6:59 AM  Result Value Ref Range   Troponin I (High Sensitivity) 3 <18 ng/L    Comment: (NOTE) Elevated high sensitivity troponin I (hsTnI) values and significant  changes across serial measurements may suggest ACS but many other  chronic and acute conditions are known to  elevate hsTnI results.  Refer to the "Links" section for chest pain algorithms and additional  guidance. Performed at [facility], [address] [place], [place] [address]   Glucose, capillary     Status: Abnormal   Collection Time: 02/11/20  7:47 AM  Result Value Ref Range   Glucose-Capillary 225 (H) 70 - 99 mg/dL    Comment: Glucose reference range applies only to samples taken after fasting for at least 8 hours.  Troponin I (High Sensitivity)     Status: None   Collection Time: 02/11/20  9:24 AM  Result Value Ref Range   Troponin I (High Sensitivity) 3 <18 ng/L    Comment: (NOTE) Elevated high sensitivity troponin I (hsTnI) values and significant  changes across serial measurements may suggest ACS but many other  chronic and acute conditions are known to elevate hsTnI results.  Refer to the "Links" section for chest pain algorithms and additional  guidance. Performed at [facility], [address] [place], [place] [address]   Glucose, capillary     Status: Abnormal   Collection Time: 02/11/20 11:30 AM  Result Value Ref Range   Glucose-Capillary 180 (H) 70 - 99 mg/dL    Comment: Glucose reference range applies only to samples taken after fasting for at least 8 hours.      Component Value Date/Time   SDES  02/09/2020 1422    ABSCESS ABDOMEN Performed at [facility], [address] [place], [place] [address]    SPECREQUEST  02/09/2020 1422    NONE Performed at [facility], [address] [place], [place] [address]    CULT  02/09/2020 1422    FEW ACTINOMYCES NEUII Standardized susceptibility testing for this organism is not available. MIXED ANAEROBIC FLORA PRESENT.  CALL LAB IF FURTHER IID REQUIRED.    REPTSTATUS 02/11/2020 FINAL 02/09/2020 1422   No results found. Recent Results (from the past 240 hour(s))  SARS Coronavirus 2 by RT PCR (hospital order, performed in [facility]  hospital lab) Nasopharyngeal Nasopharyngeal Swab     Status: None   Collection Time: 02/08/20  3:54 PM   Specimen: Nasopharyngeal Swab  Result Value Ref Range Status   SARS Coronavirus 2 NEGATIVE NEGATIVE Final    Comment: (NOTE) SARS-CoV-2 target nucleic acids are NOT DETECTED.  The SARS-CoV-2 RNA is generally detectable in upper and lower respiratory specimens during the acute phase of infection. The lowest concentration of SARS-CoV-2 viral copies this assay can detect is 250 copies / mL. A negative result does not preclude SARS-CoV-2 infection and should not be used as the sole basis for treatment or other patient management decisions.  A negative result may occur with improper specimen collection / handling, submission of specimen other than nasopharyngeal swab, presence of viral mutation(s) within the areas targeted by this assay, and inadequate number of viral copies (<250 copies / mL). A negative result must be combined with clinical observations, patient history, and epidemiological information.  Fact Sheet for Patients:   BoilerBrush.com.cyhttps://www.fda.gov/media/136312/download  Fact Sheet for Healthcare Providers: https://pope.com/https://www.fda.gov/media/136313/download  This test is not yet approved or  cleared by the [place] FDA and has been  authorized for detection and/or diagnosis of SARS-CoV-2 by FDA under an Emergency Use Authorization (EUA).  This EUA will remain in effect (meaning this test can be used) for the duration of the COVID-19 declaration under Section 564(b)(1) of the Act, 21 U.S.C. section 360bbb-3(b)(1), unless the authorization is terminated or revoked sooner.  Performed at [facility], [address] [place], [place] [address]   Blood culture (routine x 2)     Status: None (Preliminary result)   Collection Time: 02/08/20  5:45 PM   Specimen: Right Antecubital; Blood  Result Value Ref Range Status   Specimen Description   Final    RIGHT ANTECUBITAL  BLOOD Performed at [facility], [address], [place], [place] [address]    Special Requests   Final    BOTTLES DRAWN AEROBIC AND ANAEROBIC Blood Culture adequate volume Performed at [facility], [address] [place], [place] [address]    Culture   Final    NO GROWTH 3 DAYS Performed at [facility], [address], [place], [place] [address]    Report Status PENDING  Incomplete  Aerobic Culture (superficial specimen)     Status: Abnormal   Collection Time: 02/09/20  9:44 AM   Specimen: Abdomen; Wound  Result Value Ref Range Status   Specimen Description   Final    ABDOMEN Performed at [facility], [address], [place], [place] [address]    Special Requests   Final    Normal Performed at [facility], [address] [place], [place] [address]    Gram Stain   Final    RARE WBC PRESENT, PREDOMINANTLY PMN ABUNDANT GRAM POSITIVE COCCI FEW GRAM NEGATIVE RODS    Culture (A)  Final    MULTIPLE ORGANISMS PRESENT, NONE PREDOMINANT NO STAPHYLOCOCCUS AUREUS ISOLATED NO GROUP A STREP (S.PYOGENES) ISOLATED Performed at [facility], [address] [place], [place] [address]    Report Status 02/11/2020 FINAL  Final  Aerobic/Anaerobic Culture (surgical/deep wound)     Status: None   Collection Time: 02/09/20  2:22 PM   Specimen: Abscess  Result Value Ref Range Status   Specimen Description   Final    ABSCESS ABDOMEN Performed at [facility], [address] [place], [place] [address]    Special Requests   Final    NONE Performed at [facility], [address], [place], [place] [address]    Gram Stain   Final    ABUNDANT WBC PRESENT, PREDOMINANTLY PMN ABUNDANT GRAM POSITIVE COCCI MODERATE GRAM VARIABLE ROD FEW GRAM NEGATIVE COCCI Performed at [facility], [address], [place], [place] [address]    Culture   Final    FEW ACTINOMYCES  NEUII Standardized susceptibility testing for this organism is not available. MIXED ANAEROBIC FLORA PRESENT.  CALL LAB IF FURTHER IID REQUIRED.    Report Status 02/11/2020 FINAL  Final    Microbiology: Recent Results (from the past 240 hour(s))  SARS Coronavirus 2 by RT PCR (hospital order, performed in [facility] hospital lab) Nasopharyngeal Nasopharyngeal Swab     Status: None   Collection Time: 02/08/20  3:54 PM   Specimen: Nasopharyngeal Swab  Result Value Ref Range Status   SARS Coronavirus 2 NEGATIVE NEGATIVE Final    Comment: (NOTE) SARS-CoV-2 target nucleic acids are NOT DETECTED.  The SARS-CoV-2 RNA is generally detectable in upper and lower respiratory specimens during  the acute phase of infection. The lowest concentration of SARS-CoV-2 viral copies this assay can detect is 250 copies / mL. A negative result does not preclude SARS-CoV-2 infection and should not be used as the sole basis for treatment or other patient management decisions.  A negative result may occur with improper specimen collection / handling, submission of specimen other than nasopharyngeal swab, presence of viral mutation(s) within the areas targeted by this assay, and inadequate number of viral copies (<250 copies / mL). A negative result must be combined with clinical observations, patient history, and epidemiological information.  Fact Sheet for Patients:   BoilerBrush.com.cy  Fact Sheet for Healthcare Providers: https://pope.com/  This test is not yet approved or  cleared by the [place] FDA and has been authorized for detection and/or diagnosis of SARS-CoV-2 by FDA under an Emergency Use Authorization (EUA).  This EUA will remain in effect (meaning this test can be used) for the duration of the COVID-19 declaration under Section 564(b)(1) of the Act, 21 U.S.C. section 360bbb-3(b)(1), unless the authorization is terminated or revoked  sooner.  Performed at [facility], [address] [place], [place] [address]   Blood culture (routine x 2)     Status: None (Preliminary result)   Collection Time: 02/08/20  5:45 PM   Specimen: Right Antecubital; Blood  Result Value Ref Range Status   Specimen Description   Final    RIGHT ANTECUBITAL BLOOD Performed at [facility], [address], [place], [place] [address]    Special Requests   Final    BOTTLES DRAWN AEROBIC AND ANAEROBIC Blood Culture adequate volume Performed at [facility], [address] [place], [place] [address]    Culture   Final    NO GROWTH 3 DAYS Performed at [facility], [address], [place], [place] [address]    Report Status PENDING  Incomplete  Aerobic Culture (superficial specimen)     Status: Abnormal   Collection Time: 02/09/20  9:44 AM   Specimen: Abdomen; Wound  Result Value Ref Range Status   Specimen Description   Final    ABDOMEN Performed at [facility], [address] [place], [place] [address]    Special Requests   Final    Normal Performed at [facility], [address], [place], [place] [address]    Gram Stain   Final    RARE WBC PRESENT, PREDOMINANTLY PMN ABUNDANT GRAM POSITIVE COCCI FEW GRAM NEGATIVE RODS    Culture (A)  Final    MULTIPLE ORGANISMS PRESENT, NONE PREDOMINANT NO STAPHYLOCOCCUS AUREUS ISOLATED NO GROUP A STREP (S.PYOGENES) ISOLATED Performed at [facility], [address], [place], [place] [address]    Report Status 02/11/2020 FINAL  Final  Aerobic/Anaerobic Culture (surgical/deep wound)     Status: None   Collection Time: 02/09/20  2:22 PM   Specimen: Abscess  Result Value Ref Range Status   Specimen Description   Final    ABSCESS ABDOMEN Performed at [facility], [address] [place], [place] [address]    Special Requests   Final    NONE Performed at [facility], [address] [place], [place] [address]    Gram Stain   Final    ABUNDANT WBC PRESENT, PREDOMINANTLY PMN ABUNDANT GRAM POSITIVE COCCI MODERATE GRAM VARIABLE ROD FEW GRAM NEGATIVE COCCI Performed at [facility], [address], [place],  [place] [address]    Culture   Final    FEW ACTINOMYCES NEUII Standardized susceptibility testing for this organism is not available. MIXED ANAEROBIC FLORA PRESENT.  CALL LAB IF FURTHER IID REQUIRED.    Report Status 02/11/2020 FINAL  Final    Radiographs and labs were personally reviewed by me.   Johny Sax, MD [facility] [phone] 02/11/2020, 3:16 PM

## 2020-02-11 NOTE — Progress Notes (Signed)
   02/11/20 0433  Vitals  Temp 97.6 F (36.4 C)  BP 116/67  MAP (mmHg) 78  BP Location Right Arm  BP Method Automatic  Patient Position (if appropriate) Lying  Pulse Rate 65  Pulse Rate Source Dinamap  ECG Heart Rate 62  Resp 20  Level of Consciousness  Level of Consciousness Alert  MEWS COLOR  MEWS Score Color Green   Patient complains of severe chest pain and states "it came on all of sudden, it feels like my chest is  ripping open." Pain is intermittent. RN notified Rapid Response RN and M. Katherina Right, NP.   EKG (see chart), Labs ordered (see results), Patient placed on O2 2L [place] for comfort, CBG 272. PO Oxycodone given for pain per PRN order. Ativan ordered and given (see MAR).

## 2020-02-11 NOTE — Progress Notes (Signed)
2 Days Post-Op    CC: abdominal abscess  Subjective: Most of the incision is OK, some purulent fluid in left corner of the wound, picture below.  This area was not packed, packing was above it.  Cellulitis is better. She did well with the packing. Asking about pain medicine, and when she can get up.    Objective: Vital signs in last 24 hours: Temp:  [97.6 F (36.4 C)-98 F (36.7 C)] 97.6 F (36.4 C) (09/16 0445) Pulse Rate:  [72] 72 (09/15 2131) Resp:  [16] 16 (09/15 2131) BP: (115-120)/(64-78) 115/78 (09/16 0445) SpO2:  [99 %] 99 % (09/16 0445) Last BM Date:  (physician made aware by patient that she doesn't remember BM) 797 IV Urine x 3 Afebrile, VSS Na 134, K+ 3.3; glucose 274 WBC 12.7; H/H 9/27.8 Platelets 280 Gram Stain ABSCESS ABDOMEN  02/09/20  ABUNDANT WBC PRESENT, PREDOMINANTLY PMN  ABUNDANT GRAM POSITIVE COCCI  MODERATE GRAM VARIABLE ROD  FEW GRAM NEGATIVE COCCI      Intake/Output from previous day: 09/15 0701 - 09/16 0700 In: 797.1 [I.V.:59.5; IV Piggyback:737.6] Out: -  Intake/Output this shift: Total I/O In: 470.2 [I.V.:52.1; IV Piggyback:418.1] Out: -    ` General appearance: alert, cooperative and no distress Skin: open abscess as noted above.  Lab Results:  Recent Labs    02/10/20 0538 02/11/20 0553  WBC 16.5* 12.7*  HGB 10.9* 9.0*  HCT 34.2* 27.8*  PLT 277 280    BMET Recent Labs    02/10/20 0538 02/11/20 0553  NA 135 134*  K 3.7 3.3*  CL 101 104  CO2 19* 21*  GLUCOSE 381* 274*  BUN 13 11  CREATININE 0.53 0.39*  CALCIUM 8.8* 8.2*   PT/INR No results for input(s): LABPROT, INR in the last 72 hours.  Recent Labs  Lab 02/08/20 1250 02/08/20 2315 02/09/20 0733 02/10/20 0538 02/11/20 0553  AST 22 15 15 22 18   ALT 17 14 13 19 15   ALKPHOS 85 76 72 93 66  BILITOT 1.2 1.4* 1.7* 1.5* 0.9  PROT 7.7 7.1 6.6 7.6 6.5  ALBUMIN 2.8* 2.6* 2.2* 2.6* 2.1*     Lipase  No results found for: LIPASE   Medications: . enoxaparin  (LOVENOX) injection  55 mg Subcutaneous Q24H  . insulin aspart  0-15 Units Subcutaneous TID WC  . insulin aspart  0-5 Units Subcutaneous QHS  . insulin glargine  20 Units Subcutaneous QHS   . sodium chloride Stopped (02/10/20 1242)  . ceFEPime (MAXIPIME) IV 2 g (02/11/20 0211)  . metronidazole 500 mg (02/11/20 0521)  . vancomycin 1,000 mg (02/11/20 0409)    Assessment/Plan Type II diabetes with poor control Hx of hidradenitis suppurativa Hx of psychosis tobacco use BMI 39.9  Abdominal abscess S/p Incision and drainage of abdominal wall abscess w/ Soft tissue debridement of abdominal wall - 10 x 15 cm down to the level of fascia by Dr. 02/13/20 on 9/14 - POD #2 - Await final cx's. Cont IV abx - Dressing changes, BID WTD - We will follow with you  FEN - Carb modified VTE - SCDs, Lovenox ID -  Clindamycin 9/13 x 2; >> day ; Zosyn 9/13>>  x day 2; Vancomycin 9/13>> day 4;  Flagyl 9/14 >>day 3; maxipime 9/14 >> day 3   Plan:  Wet to dry dressings BID, if it is clean we may try a wound vac tomorrow.  Await culture for abscess, for abx therapy, continue IV antibiotic therapy for now.  The abscess  is well drained.  She needs good control of diabetes, also.  Will start mobilizing today also.     LOS: 3 days    Haley [name] 02/11/2020 Please see Amion

## 2020-02-11 NOTE — Plan of Care (Signed)
  Problem: Education: Goal: Knowledge of General Education information will improve Description: Including pain rating scale, medication(s)/side effects and non-pharmacologic comfort measures Outcome: Progressing   Problem: Clinical Measurements: Goal: Ability to maintain clinical measurements within normal limits will improve Outcome: Progressing Goal: Respiratory complications will improve Outcome: Progressing Goal: Cardiovascular complication will be avoided Outcome: Progressing   Problem: Activity: Goal: Risk for activity intolerance will decrease Outcome: Progressing   Problem: Nutrition: Goal: Adequate nutrition will be maintained Outcome: Progressing   Problem: Coping: Goal: Level of anxiety will decrease Outcome: Progressing   Problem: Elimination: Goal: Will not experience complications related to urinary retention Outcome: Progressing   Problem: Pain Managment: Goal: General experience of comfort will improve Outcome: Progressing   Problem: Safety: Goal: Ability to remain free from injury will improve Outcome: Progressing   

## 2020-02-12 DIAGNOSIS — K5900 Constipation, unspecified: Secondary | ICD-10-CM

## 2020-02-12 DIAGNOSIS — E876 Hypokalemia: Secondary | ICD-10-CM

## 2020-02-12 DIAGNOSIS — R079 Chest pain, unspecified: Secondary | ICD-10-CM

## 2020-02-12 LAB — CBC
HCT: 28.6 % — ABNORMAL LOW (ref 36.0–46.0)
Hemoglobin: 9.2 g/dL — ABNORMAL LOW (ref 12.0–15.0)
MCH: 26.6 pg (ref 26.0–34.0)
MCHC: 32.2 g/dL (ref 30.0–36.0)
MCV: 82.7 fL (ref 80.0–100.0)
Platelets: 301 10*3/uL (ref 150–400)
RBC: 3.46 MIL/uL — ABNORMAL LOW (ref 3.87–5.11)
RDW: 13.2 % (ref 11.5–15.5)
WBC: 9.7 10*3/uL (ref 4.0–10.5)
nRBC: 0 % (ref 0.0–0.2)

## 2020-02-12 LAB — BASIC METABOLIC PANEL
Anion gap: 7 (ref 5–15)
BUN: 7 mg/dL (ref 6–20)
CO2: 23 mmol/L (ref 22–32)
Calcium: 8.1 mg/dL — ABNORMAL LOW (ref 8.9–10.3)
Chloride: 108 mmol/L (ref 98–111)
Creatinine, Ser: 0.42 mg/dL — ABNORMAL LOW (ref 0.44–1.00)
GFR calc Af Amer: >60 mL/min (ref >60)
GFR calc non Af Amer: >60 mL/min (ref >60)
Glucose, Bld: 126 mg/dL — ABNORMAL HIGH (ref 70–99)
Potassium: 3.4 mmol/L — ABNORMAL LOW (ref 3.5–5.1)
Sodium: 138 mmol/L (ref 135–145)

## 2020-02-12 LAB — GLUCOSE, CAPILLARY
Glucose-Capillary: 117 mg/dL — ABNORMAL HIGH (ref 70–99)
Glucose-Capillary: 130 mg/dL — ABNORMAL HIGH (ref 70–99)
Glucose-Capillary: 124 mg/dL — ABNORMAL HIGH (ref 70–99)
Glucose-Capillary: 121 mg/dL — ABNORMAL HIGH (ref 70–99)
Glucose-Capillary: 145 mg/dL — ABNORMAL HIGH (ref 70–99)
Glucose-Capillary: 205 mg/dL — ABNORMAL HIGH (ref 70–99)

## 2020-02-12 MED ORDER — POLYETHYLENE GLYCOL 3350 17 G PO PACK
17.0000 g | PACK | Freq: Every day | ORAL | Status: DC
  Administered 2020-02-12 – 2020-02-13 (×2): 17 g via ORAL
  Filled 2020-02-12 (×3): qty 1

## 2020-02-12 MED ORDER — POTASSIUM CHLORIDE CRYS ER 20 MEQ PO TBCR
40.0000 meq | EXTENDED_RELEASE_TABLET | Freq: Once | ORAL | Status: AC
  Administered 2020-02-12: 40 meq via ORAL
  Filled 2020-02-12: qty 2

## 2020-02-12 MED ORDER — SENNOSIDES-DOCUSATE SODIUM 8.6-50 MG PO TABS
1.0000 | ORAL_TABLET | Freq: Two times a day (BID) | ORAL | Status: DC
  Administered 2020-02-12 – 2020-02-13 (×3): 1 via ORAL
  Filled 2020-02-12 (×6): qty 1

## 2020-02-12 NOTE — Progress Notes (Signed)
3 Days Post-Op  Subjective: CC: For dressing change. Doing well. Some pain with dressing changes.   Objective: Vital signs in last 24 hours: Temp:  [97.9 F (36.6 C)-98.1 F (36.7 C)] 97.9 F (36.6 C) (09/16 2147) Pulse Rate:  [79-80] 79 (09/16 2147) Resp:  [20] 20 (09/16 2147) BP: (113-127)/(67-76) 127/76 (09/16 2147) SpO2:  [99 %-100 %] 99 % (09/16 2147) Last BM Date:  (physician made aware by patient that she doesn't remember BM)  Intake/Output from previous day: No intake/output data recorded. Intake/Output this shift: No intake/output data recorded.  PE: Gen: Awake and alert, NAD Lungs: Normal rate and effort  Abd/Wound: See picture below. Wound measures 10cm in greatest diameter. 4-5cm in depth. There is 6cm of tunneling at the 10 o'clock position (right lateral). Base of wound with some pink tissue and fibrinous tissue. No drainage or purulence. Some surrounding induration without erythema.      Lab Results:  Recent Labs    02/11/20 0553 02/12/20 0547  WBC 12.7* 9.7  HGB 9.0* 9.2*  HCT 27.8* 28.6*  PLT 280 301   BMET Recent Labs    02/11/20 0553 02/12/20 0547  NA 134* 138  K 3.3* 3.4*  CL 104 108  CO2 21* 23  GLUCOSE 274* 126*  BUN 11 7  CREATININE 0.39* 0.42*  CALCIUM 8.2* 8.1*   PT/INR No results for input(s): LABPROT, INR in the last 72 hours. CMP     Component Value Date/Time   NA 138 02/12/2020 0547   K 3.4 (L) 02/12/2020 0547   CL 108 02/12/2020 0547   CO2 23 02/12/2020 0547   GLUCOSE 126 (H) 02/12/2020 0547   BUN 7 02/12/2020 0547   CREATININE 0.42 (L) 02/12/2020 0547   CALCIUM 8.1 (L) 02/12/2020 0547   PROT 6.5 02/11/2020 0553   ALBUMIN 2.1 (L) 02/11/2020 0553   AST 18 02/11/2020 0553   ALT 15 02/11/2020 0553   ALKPHOS 66 02/11/2020 0553   BILITOT 0.9 02/11/2020 0553   GFRNONAA >60 02/12/2020 0547   GFRAA >60 02/12/2020 0547   Lipase  No results found for: LIPASE     Studies/Results: No results  found.  Anti-infectives: Anti-infectives (From admission, onward)   Start     Dose/Rate Route Frequency Ordered Stop   02/11/20 1700  Ampicillin-Sulbactam (UNASYN) 3 g in sodium chloride 0.9 % 100 mL IVPB        3 g 200 mL/hr over 30 Minutes Intravenous Every 6 hours 02/11/20 1526     02/09/20 1600  metroNIDAZOLE (FLAGYL) IVPB 500 mg  Status:  Discontinued        500 mg 100 mL/hr over 60 Minutes Intravenous Every 8 hours 02/09/20 1021 02/11/20 1526   02/09/20 1600  ceFEPIme (MAXIPIME) 2 g in sodium chloride 0.9 % 100 mL IVPB  Status:  Discontinued        2 g 200 mL/hr over 30 Minutes Intravenous Every 8 hours 02/09/20 1021 02/11/20 1526   02/09/20 0615  clindamycin (CLEOCIN) IVPB 600 mg  Status:  Discontinued        600 mg 100 mL/hr over 30 Minutes Intravenous Every 8 hours 02/09/20 0607 02/09/20 1021   02/08/20 1800  piperacillin-tazobactam (ZOSYN) IVPB 3.375 g  Status:  Discontinued        3.375 g 12.5 mL/hr over 240 Minutes Intravenous Every 8 hours 02/08/20 1625 02/09/20 1021   02/08/20 1615  clindamycin (CLEOCIN) IVPB 600 mg        600  mg 100 mL/hr over 30 Minutes Intravenous  Once 02/08/20 1614 02/08/20 1809   02/08/20 1600  vancomycin (VANCOCIN) IVPB 1000 mg/200 mL premix  Status:  Discontinued        1,000 mg 200 mL/hr over 60 Minutes Intravenous Every 12 hours 02/08/20 1559 02/11/20 1526       Assessment/Plan Type II diabetes with poor control Hx of hidradenitis suppurativa Hx of psychosis tobacco use BMI 39.9  Abdominal abscess S/pIncision and drainage of abdominal wall abscessw/Soft tissue debridement of abdominal wall - 10 x 15 cm down to the level of fasciaby Dr. Cliffton Asters on 9/14 - POD #3 - Cx's w/ Actinomyces Neuii. Abx per ID. Currently on Unasyn. Per ID - Dressing changes, BID WTD - Will see again Monday to consider possible vac placement. Please call over the weekend if there are any concerns.   FEN -Carb modified VTE -SCDs, Lovenox ID -  Clindamycin 9/13 x 2; >>day ; Zosyn 9/13 - 9/14 x day 2; Vancomycin 9/1 - 9/16.;  Flagyl 9/14 - 9/16. Maxipime 9/14 - 9/16. Unasyn   LOS: 4 days    Jacinto Halim , [facility] Surgery 02/12/2020, 8:23 AM Please see Amion for pager number during day hours 7:00am-4:30pm

## 2020-02-12 NOTE — Progress Notes (Signed)
PHARMACY CONSULT NOTE FOR:  OUTPATIENT  PARENTERAL ANTIBIOTIC THERAPY (OPAT)  Indication: abdominal abscess Regimen:  unasyn 3gm IV q6h End date: 02/24/2020  IV antibiotic discharge orders are pended. To discharging provider:  please sign these orders via discharge navigator,  Select New Orders & click on the button choice - Manage This Unsigned Work.     Thank you for allowing pharmacy to be a part of this patient's care.  Lucia Gaskins 02/12/2020, 3:53 PM

## 2020-02-12 NOTE — Progress Notes (Addendum)
INFECTIOUS DISEASE PROGRESS NOTE  ID: Haley [name] is a 34 y.o. female with  Active Problems:   Hidradenitis suppurativa   Cellulitis   Sepsis ([place])   Lactic acid acidosis   Hypoalbuminemia   Hyperglycemia   Iron deficiency anemia due to chronic blood loss   Cellulitis of right abdominal wall  Subjective: No commpaints.  No n/v/d No BM since adm  Abtx:  Anti-infectives (From admission, onward)   Start     Dose/Rate Route Frequency Ordered Stop   02/11/20 1700  Ampicillin-Sulbactam (UNASYN) 3 g in sodium chloride 0.9 % 100 mL IVPB        3 g 200 mL/hr over 30 Minutes Intravenous Every 6 hours 02/11/20 1526     02/09/20 1600  metroNIDAZOLE (FLAGYL) IVPB 500 mg  Status:  Discontinued        500 mg 100 mL/hr over 60 Minutes Intravenous Every 8 hours 02/09/20 1021 02/11/20 1526   02/09/20 1600  ceFEPIme (MAXIPIME) 2 g in sodium chloride 0.9 % 100 mL IVPB  Status:  Discontinued        2 g 200 mL/hr over 30 Minutes Intravenous Every 8 hours 02/09/20 1021 02/11/20 1526   02/09/20 0615  clindamycin (CLEOCIN) IVPB 600 mg  Status:  Discontinued        600 mg 100 mL/hr over 30 Minutes Intravenous Every 8 hours 02/09/20 0607 02/09/20 1021   02/08/20 1800  piperacillin-tazobactam (ZOSYN) IVPB 3.375 g  Status:  Discontinued        3.375 g 12.5 mL/hr over 240 Minutes Intravenous Every 8 hours 02/08/20 1625 02/09/20 1021   02/08/20 1615  clindamycin (CLEOCIN) IVPB 600 mg        600 mg 100 mL/hr over 30 Minutes Intravenous  Once 02/08/20 1614 02/08/20 1809   02/08/20 1600  vancomycin (VANCOCIN) IVPB 1000 mg/200 mL premix  Status:  Discontinued        1,000 mg 200 mL/hr over 60 Minutes Intravenous Every 12 hours 02/08/20 1559 02/11/20 1526      Medications:  Scheduled:  enoxaparin (LOVENOX) injection  55 mg Subcutaneous Q24H   insulin aspart  0-15 Units Subcutaneous TID WC   insulin aspart  0-5 Units Subcutaneous QHS   insulin aspart  6 Units Subcutaneous TID WC   insulin  glargine  24 Units Subcutaneous QHS    Objective: Vital signs in last 24 hours: Temp:  [97.9 F (36.6 C)-98.6 F (37 C)] 98.6 F (37 C) (09/17 1421) Pulse Rate:  [79-81] 81 (09/17 1421) Resp:  [16-20] 16 (09/17 1421) BP: (117-127)/(68-76) 117/68 (09/17 1421) SpO2:  [98 %-99 %] 98 % (09/17 1421)   General appearance: alert, cooperative and no distress Resp: clear to auscultation bilaterally Cardio: regular rate and rhythm GI: normal findings: bowel sounds normal, soft, non-tender and lower abd wound is dressed.   Lab Results Recent Labs    02/11/20 0553 02/12/20 0547  WBC 12.7* 9.7  HGB 9.0* 9.2*  HCT 27.8* 28.6*  NA 134* 138  K 3.3* 3.4*  CL 104 108  CO2 21* 23  BUN 11 7  CREATININE 0.39* 0.42*   Liver Panel Recent Labs    02/10/20 0538 02/11/20 0553  PROT 7.6 6.5  ALBUMIN 2.6* 2.1*  AST 22 18  ALT 19 15  ALKPHOS 93 66  BILITOT 1.5* 0.9   Sedimentation Rate No results for input(s): ESRSEDRATE in the last 72 hours. C-Reactive Protein No results for input(s): CRP in the last 72 hours.  Microbiology: Recent Results (from the past 240 hour(s))  SARS Coronavirus 2 by RT PCR (hospital order, performed in [facility] hospital lab) Nasopharyngeal Nasopharyngeal Swab     Status: None   Collection Time: 02/08/20  3:54 PM   Specimen: Nasopharyngeal Swab  Result Value Ref Range Status   SARS Coronavirus 2 NEGATIVE NEGATIVE Final    Comment: (NOTE) SARS-CoV-2 target nucleic acids are NOT DETECTED.  The SARS-CoV-2 RNA is generally detectable in upper and lower respiratory specimens during the acute phase of infection. The lowest concentration of SARS-CoV-2 viral copies this assay can detect is 250 copies / mL. A negative result does not preclude SARS-CoV-2 infection and should not be used as the sole basis for treatment or other patient management decisions.  A negative result may occur with improper specimen collection / handling, submission of specimen  other than nasopharyngeal swab, presence of viral mutation(s) within the areas targeted by this assay, and inadequate number of viral copies (<250 copies / mL). A negative result must be combined with clinical observations, patient history, and epidemiological information.  Fact Sheet for Patients:   StrictlyIdeas.no  Fact Sheet for Healthcare Providers: BankingDealers.co.za  This test is not yet approved or  cleared by the [place] FDA and has been authorized for detection and/or diagnosis of SARS-CoV-2 by FDA under an Emergency Use Authorization (EUA).  This EUA will remain in effect (meaning this test can be used) for the duration of the COVID-19 declaration under Section 564(b)(1) of the Act, 21 U.S.C. section 360bbb-3(b)(1), unless the authorization is terminated or revoked sooner.  Performed at [facility], [address] [place], [place] [address]   Blood culture (routine x 2)     Status: None (Preliminary result)   Collection Time: 02/08/20  5:45 PM   Specimen: Right Antecubital; Blood  Result Value Ref Range Status   Specimen Description   Final    RIGHT ANTECUBITAL BLOOD Performed at [facility], [place] [address], [place], [place] [address]    Special Requests   Final    BOTTLES DRAWN AEROBIC AND ANAEROBIC Blood Culture adequate volume Performed at [facility], [address] [place], [place] [address]    Culture   Final    NO GROWTH 4 DAYS Performed at [facility], [place] [address] [place], [place] [address]    Report Status PENDING  Incomplete  Aerobic Culture (superficial specimen)     Status: Abnormal   Collection Time: 02/09/20  9:44 AM   Specimen: Abdomen; Wound  Result Value Ref Range Status   Specimen Description   Final    ABDOMEN Performed at [place] [address] [place], [place] [address]    Special Requests   Final     Normal Performed at [facility], [place] [address], [place], [place] [address]    Gram Stain   Final    RARE WBC PRESENT, PREDOMINANTLY PMN ABUNDANT GRAM POSITIVE COCCI FEW GRAM NEGATIVE RODS    Culture (A)  Final    MULTIPLE ORGANISMS PRESENT, NONE PREDOMINANT NO STAPHYLOCOCCUS AUREUS ISOLATED NO GROUP A STREP (S.PYOGENES) ISOLATED Performed at [facility], [address] [place], [place] [address]    Report Status 02/11/2020 FINAL  Final  Aerobic/Anaerobic Culture (surgical/deep wound)     Status: None   Collection Time: 02/09/20  2:22 PM   Specimen: Abscess  Result Value Ref Range Status   Specimen Description  Final    ABSCESS ABDOMEN Performed at [place] [address], [place], [place] [address]    Special Requests   Final    NONE Performed at [facility], [place] [address], [place], [place] [address]    Gram Stain   Final    ABUNDANT WBC PRESENT, PREDOMINANTLY PMN ABUNDANT GRAM POSITIVE COCCI MODERATE GRAM VARIABLE ROD FEW GRAM NEGATIVE COCCI Performed at [facility], [place] [address], [place], [place] [address]    Culture   Final    FEW ACTINOMYCES NEUII Standardized susceptibility testing for this organism is not available. MIXED ANAEROBIC FLORA PRESENT.  CALL LAB IF FURTHER IID REQUIRED.    Report Status 02/11/2020 FINAL  Final    Studies/Results: No results found.   Assessment/Plan: abd wall abscess  Actinomyces neuii, poly-microbial DM1 uncontrolled Hidradenitis suppurativa constipation  Total days of antibiotics: 4 unasyn       Continue unasyn for 2 weeks then onto po augmentin.  Repeat abd imaging in 1-2 months Wound vac per surgery.  Diabetic control seems fairly good in hospital. Will need to manage as outpt as well  Bowel hygeine Will f/u on 9-20     No Known Allergies  OPAT Orders Discharge antibiotics to be given via PICC line Discharge antibiotics:  Unasyn  Duration: 2 weeks End Date: 02-24-20  [facility] Care Per Protocol: please  Home health RN for IV administration and teaching; PICC line care and labs.    Labs weekly while on IV antibiotics: _x_ CBC with differential __ BMP _xx_ CMP __ CRP __ ESR __ Vancomycin trough __ CK  _x  Please pull PIC at completion of IV antibiotics __ Please leave PIC in place until doctor has seen patient or been notified  Fax weekly labs to [phone]  Clinic Follow Up Appt: 1 month, ID Creola Corn MD, FACP Infectious Diseases (pager) [phone] [email or web address] 02/12/2020, 3:04 PM  LOS: 4 days

## 2020-02-12 NOTE — Progress Notes (Addendum)
PROGRESS NOTE    Haley [name]  XIH:[id number] DOB: 01-22-1986 DOA: 02/08/2020 PCP: Lesleigh Noe, MD    Chief Complaint  Patient presents with  . Abscess    Brief Narrative:  Haley [name] a 34 y.o.femalewithDM noncompliant with metformin, h/o hidradenitis suppurativa, bipolar disorder[facility] with developingworsening boil and swellingon right lower abdomen.   Patient reports that she first noticed a boil in her right lower abdomen almost a week ago. She used leftover doxycycline 100 mg twice daily for 4 days without any significant improvement in boil that kept growing in size with surrounding redness and swelling. Also tried"boil ease","draining salve"that she bought through [employer] without any significant improvement. Been using OTC lidocaine for pain relief.  She now reports fever chills and generalized malaise. She denies any history of MRSA infection reports that last skin infection was in her left axilla that was treated with Keflex in July 2021.  Novomiting or diarrhea.   She is currently single without any children. Smokes 2 cigars daily for past 15 years. Denies any drinking or recreational drug use. She currently works at [employer].  Family history significant for mother with DM and CHF. Dad also has diabetes. Otherwise unremarkable.  Patient seen by general surgery, underwent incision and drainage, cultures pending, placed on IV antibiotics.    Assessment & Plan:   Active Problems:   Hidradenitis suppurativa   Cellulitis   Sepsis (HCC)   Lactic acid acidosis   Hypoalbuminemia   Hyperglycemia   Iron deficiency anemia due to chronic blood loss   Cellulitis of right abdominal wall  1 severe sepsis secondary to abdominal wall abscess/cellulitis, POA On admission patient met criteria for severe sepsis with fever, tachycardia, leukocytosis, lactic acid level of 2.4.  Patient with clinical improvement.  Patient seen in  consultation by general surgery patient subsequently underwent incision and drainage of abdominal wall abscess and soft tissue debridement of abdominal wall 10 x 15 cm down to the level of the fascia per general surgery on 02/09/2020 with cultures positive for actinomyces neuii, polymicrobial.  Patient seen in consultation by ID and IV antibiotics have been narrowed down to IV Unasyn.  ID recommended 2 weeks of IV Unasyn and subsequently on to oral Augmentin.  ID recommended repeat abdominal imaging in 1 to 2 months.  Continue current dressing changes as recommended by general surgery.  General surgery recommending probable wound VAC placement on Monday.  ID, general surgery following and appreciate input and recommendations.   2.  Poorly controlled diabetes mellitus type 2 Hemoglobin A1c 10.8 (02/08/2020).  CBG in 117 this morning.  Continue Lantus 24 units daily, NovoLog 6 units 3 times daily meal coverage, sliding scale insulin.  Will likely need to go home on insulin.  Continue to hold oral hypoglycemic agents.  Diabetic coordinator following.  Will need close outpatient diabetes follow-up/management.  3.  Iron deficiency anemia/anemia of chronic disease Hemoglobin currently stable at 9.2.  Patient with no overt bleeding.  Follow H&H.  Transfusion threshold hemoglobin < 7.  4.  Hypokalemia K. Dur 40 mEq p.o. x1.   5.  Chest pain Patient with some complaints of intermittent chest pain.  Questionable anxiety versus musculoskeletal in nature.  Check a EKG.  If EKG is abnormal we will cycle cardiac enzymes and check a 2D echo.  Follow.  6.  Constipation Placed on MiraLAX daily.  Senokot-S twice daily.  Follow.    DVT prophylaxis: Lovenox Code Status: Full Family Communication: Updated patient.  No  family at bedside. Disposition:   Status is: Inpatient    Dispo: The patient is from: Home              Anticipated d/c is to: Home              Anticipated d/c date is: 4 to 5 days               Patient currently on IV antibiotics, cultures pending, likely to have a wound VAC placed on Monday per general surgery, not stable for discharge.       Consultants:   General surgery: Dr. Dema Severin 02/09/2020  ID: Dr. Johnnye Sima 02/11/2020  Procedures:   CT abdomen and pelvis 02/08/2020  Incision and drainage of abdominal wall abscess/soft tissue debridement of abdominal wall 10 x 15 cm down to the level of the fascia per Dr. Dema Severin 02/09/2020  Antimicrobials:   IV cefepime 02/09/2020>>>>02/11/2020  IV clindamycin 9/14/2021x1 dose  IV Flagyl 02/09/2020>>>>> 02/11/2020  IV Zosyn 02/08/2020>>> 02/09/2020  IV vancomycin 02/08/2020>>>> 02/11/2020  IV Unasyn 02/11/2020>>>>   Subjective: Patient sleeping but arousable.  Denies any chest pain or shortness of breath.  Feeling better.  Stated he ate too well today.  Complaining of some intermittent chest pain overnight which he states was attributed to anxiety.  Currently chest pain-free.  Objective: Vitals:   02/11/20 0433 02/11/20 0445 02/11/20 1442 02/11/20 2147  BP: 116/67 115/78 113/67 127/76  Pulse: 65  80 79  Resp: _0 Temp: 97.6 F (36.4 C) 97.6 F (36.4 C) 98.1 F (36.7 C) 97.9 F (36.6 C)  TempSrc:  Oral Oral Oral  SpO2:  99% 100% 99%  Weight:      Height:       No intake or output data in the 24 hours ending 02/12/20 1259 Filed Weights   02/08/20 1208 02/09/20 1255  Weight: 108.9 kg 108.9 kg    Examination:  General exam: NAD Respiratory system: Lungs clear to auscultation bilaterally.  No wheezes, no crackles, no rhonchi.  Normal respiratory effort.  Speaking in full sentences.  Cardiovascular system: RRR no murmurs rubs or gallops.  No JVD.  No lower extremity edema.   Gastrointestinal system: Abdomen soft, nontender, nondistended, positive bowel sounds.  Bandage on abdominal wall.  No rebound.  No guarding. Central nervous system: Alert and oriented. No focal neurological deficits. Extremities: Symmetric 5 x 5  power. Skin: No rashes, lesions or ulcers Psychiatry: Judgement and insight appear normal. Mood & affect appropriate.          Data Reviewed: I have personally reviewed following labs and imaging studies  CBC: Recent Labs  Lab 02/08/20 1250 02/08/20 1250 02/08/20 2315 02/09/20 0733 02/10/20 0538 02/11/20 0553 02/12/20 0547  WBC 16.7*   < > 18.8* 14.3* 16.5* 12.7* 9.7  NEUTROABS 13.9*  --  15.4*  --   --  9.4*  --   HGB 10.8*   < > 9.9* 10.0* 10.9* 9.0* 9.2*  HCT 33.4*   < > 30.9* 30.3* 34.2* 27.8* 28.6*  MCV 82.1   < > 84.0 81.9 83.2 82.5 82.7  PLT 187   < > 201 199 277 280 301   < > = values in this interval not displayed.    Basic Metabolic Panel: Recent Labs  Lab 02/08/20 2315 02/09/20 0733 02/10/20 0538 02/11/20 0553 02/12/20 0547  NA 132* 134* 135 134* 138  K 3.4* 3.2* 3.7 3.3* 3.4*  CL 99 100 101 104 108  CO2 20* 22 19* 21* 23  GLUCOSE 253* 235* 381* 274* 126*  BUN <5* 5* _0 CREATININE 0.46 0.48 0.53 0.39* 0.42*  CALCIUM 8.2* 8.0* 8.8* 8.2* 8.1*    GFR: Estimated Creatinine Clearance: 119.4 mL/min (A) (by C-G formula based on SCr of 0.42 mg/dL (L)).  Liver Function Tests: Recent Labs  Lab 02/08/20 1250 02/08/20 2315 02/09/20 0733 02/10/20 0538 02/11/20 0553  AST _1 ALT _2 ALKPHOS 85 76 72 93 66  BILITOT 1.2 1.4* 1.7* 1.5* 0.9  PROT 7.7 7.1 6.6 7.6 6.5  ALBUMIN 2.8* 2.6* 2.2* 2.6* 2.1*    CBG: Recent Labs  Lab 02/11/20 1130 02/11/20 1659 02/11/20 2204 02/12/20 0741 02/12/20 1140  GLUCAP 180* 123* 168* 117* 130*     Recent Results (from the past 240 hour(s))  SARS Coronavirus 2 by RT PCR (hospital order, performed in [place] hospital lab) Nasopharyngeal Nasopharyngeal Swab     Status: None   Collection Time: 02/08/20  3:54 PM   Specimen: Nasopharyngeal Swab  Result Value Ref Range Status   SARS Coronavirus 2 NEGATIVE NEGATIVE Final    Comment: (NOTE) SARS-CoV-2 target nucleic acids are NOT  DETECTED.  The SARS-CoV-2 RNA is generally detectable in upper and lower respiratory specimens during the acute phase of infection. The lowest concentration of SARS-CoV-2 viral copies this assay can detect is 250 copies / mL. A negative result does not preclude SARS-CoV-2 infection and should not be used as the sole basis for treatment or other patient management decisions.  A negative result may occur with improper specimen collection / handling, submission of specimen other than nasopharyngeal swab, presence of viral mutation(s) within the areas targeted by this assay, and inadequate number of viral copies (<250 copies / mL). A negative result must be combined with clinical observations, patient history, and epidemiological information.  Fact Sheet for Patients:   StrictlyIdeas.no  Fact Sheet for Healthcare Providers: BankingDealers.co.za  This test is not yet approved or  cleared by the [place] FDA and has been authorized for detection and/or diagnosis of SARS-CoV-2 by FDA under an Emergency Use Authorization (EUA).  This EUA will remain in effect (meaning this test can be used) for the duration of the COVID-19 declaration under Section 564(b)(1) of the Act, 21 U.S.C. section 360bbb-3(b)(1), unless the authorization is terminated or revoked sooner.  Performed at [facility], [address] [place], [place] [address]   Blood culture (routine x 2)     Status: None (Preliminary result)   Collection Time: 02/08/20  5:45 PM   Specimen: Right Antecubital; Blood  Result Value Ref Range Status   Specimen Description   Final    RIGHT ANTECUBITAL BLOOD Performed at [facility], [place] [address], [place], [place] [address]    Special Requests   Final    BOTTLES DRAWN AEROBIC AND ANAEROBIC Blood Culture adequate volume Performed at [facility], [address] [place], [place] [address]    Culture    Final    NO GROWTH 4 DAYS Performed at [facility], [place] [address] [place], [place] [address]    Report Status PENDING  Incomplete  Aerobic Culture (superficial specimen)     Status: Abnormal   Collection Time: 02/09/20  9:44 AM   Specimen: Abdomen; Wound  Result Value Ref Range Status   Specimen Description   Final    ABDOMEN  Performed at [facility], [place] [address], [place], [place] [address]    Special Requests   Final    Normal Performed at [facility], [place] [address], [place], [place] [address]    Gram Stain   Final    RARE WBC PRESENT, PREDOMINANTLY PMN ABUNDANT GRAM POSITIVE COCCI FEW GRAM NEGATIVE RODS    Culture (A)  Final    MULTIPLE ORGANISMS PRESENT, NONE PREDOMINANT NO STAPHYLOCOCCUS AUREUS ISOLATED NO GROUP A STREP (S.PYOGENES) ISOLATED Performed at [facility], [address] [place], [place] [address]    Report Status 02/11/2020 FINAL  Final  Aerobic/Anaerobic Culture (surgical/deep wound)     Status: None   Collection Time: 02/09/20  2:22 PM   Specimen: Abscess  Result Value Ref Range Status   Specimen Description   Final    ABSCESS ABDOMEN Performed at [place] [address] [place], [place] [address]    Special Requests   Final    NONE Performed at [facility], [place] [address] [place], [place] [address]    Gram Stain   Final    ABUNDANT WBC PRESENT, PREDOMINANTLY PMN ABUNDANT GRAM POSITIVE COCCI MODERATE GRAM VARIABLE ROD FEW GRAM NEGATIVE COCCI Performed at [facility], [place] [address], [place], [place] [address]    Culture   Final    FEW ACTINOMYCES NEUII Standardized susceptibility testing for this organism is not available. MIXED ANAEROBIC FLORA PRESENT.  CALL LAB IF FURTHER IID REQUIRED.    Report Status 02/11/2020 FINAL  Final         Radiology Studies: No results found.      Scheduled Meds: . enoxaparin (LOVENOX)  injection  55 mg Subcutaneous Q24H  . insulin aspart  0-15 Units Subcutaneous TID WC  . insulin aspart  0-5 Units Subcutaneous QHS  . insulin aspart  6 Units Subcutaneous TID WC  . insulin glargine  24 Units Subcutaneous QHS   Continuous Infusions: . sodium chloride Stopped (02/10/20 1242)  . ampicillin-sulbactam (UNASYN) IV 3 g (02/12/20 1046)     LOS: 4 days    Time spent: 35 minutes    Irine Seal, MD [facility]   To contact the attending provider between 7A-7P or the covering provider during after hours 7P-7A, please log into the web site [email or web address] and access using universal [place] password for that web site. If you do not have the password, please call the hospital operator.  02/12/2020, 12:59 PM

## 2020-02-13 DIAGNOSIS — B373 Candidiasis of vulva and vagina: Secondary | ICD-10-CM

## 2020-02-13 LAB — CBC WITH DIFFERENTIAL/PLATELET
Abs Immature Granulocytes: 0.27 10*3/uL — ABNORMAL HIGH (ref 0.00–0.07)
Basophils Absolute: 0 10*3/uL (ref 0.0–0.1)
Basophils Relative: 1 %
Eosinophils Absolute: 0.1 10*3/uL (ref 0.0–0.5)
Eosinophils Relative: 1 %
HCT: 27.1 % — ABNORMAL LOW (ref 36.0–46.0)
Hemoglobin: 8.8 g/dL — ABNORMAL LOW (ref 12.0–15.0)
Immature Granulocytes: 3 %
Lymphocytes Relative: 30 %
Lymphs Abs: 2.7 10*3/uL (ref 0.7–4.0)
MCH: 26.9 pg (ref 26.0–34.0)
MCHC: 32.5 g/dL (ref 30.0–36.0)
MCV: 82.9 fL (ref 80.0–100.0)
Monocytes Absolute: 0.5 10*3/uL (ref 0.1–1.0)
Monocytes Relative: 5 %
Neutro Abs: 5.3 10*3/uL (ref 1.7–7.7)
Neutrophils Relative %: 60 %
Platelets: 291 10*3/uL (ref 150–400)
RBC: 3.27 MIL/uL — ABNORMAL LOW (ref 3.87–5.11)
RDW: 13.2 % (ref 11.5–15.5)
WBC: 8.8 10*3/uL (ref 4.0–10.5)
nRBC: 0 % (ref 0.0–0.2)

## 2020-02-13 LAB — BASIC METABOLIC PANEL
Anion gap: 8 (ref 5–15)
BUN: <5 mg/dL — ABNORMAL LOW (ref 6–20)
CO2: 24 mmol/L (ref 22–32)
Calcium: 8.1 mg/dL — ABNORMAL LOW (ref 8.9–10.3)
Chloride: 107 mmol/L (ref 98–111)
Creatinine, Ser: 0.34 mg/dL — ABNORMAL LOW (ref 0.44–1.00)
GFR calc Af Amer: >60 mL/min (ref >60)
GFR calc non Af Amer: >60 mL/min (ref >60)
Glucose, Bld: 146 mg/dL — ABNORMAL HIGH (ref 70–99)
Potassium: 3.4 mmol/L — ABNORMAL LOW (ref 3.5–5.1)
Sodium: 139 mmol/L (ref 135–145)

## 2020-02-13 LAB — CULTURE, BLOOD (ROUTINE X 2)
Culture: NO GROWTH
Special Requests: ADEQUATE

## 2020-02-13 LAB — GLUCOSE, CAPILLARY
Glucose-Capillary: 144 mg/dL — ABNORMAL HIGH (ref 70–99)
Glucose-Capillary: 155 mg/dL — ABNORMAL HIGH (ref 70–99)
Glucose-Capillary: 136 mg/dL — ABNORMAL HIGH (ref 70–99)
Glucose-Capillary: 159 mg/dL — ABNORMAL HIGH (ref 70–99)

## 2020-02-13 LAB — MAGNESIUM: Magnesium: 1.6 mg/dL — ABNORMAL LOW (ref 1.7–2.4)

## 2020-02-13 MED ORDER — CLOTRIMAZOLE 1 % VA CREA
1.0000 | TOPICAL_CREAM | Freq: Every day | VAGINAL | Status: DC
  Administered 2020-02-13 – 2020-02-15 (×3): 1 via VAGINAL
  Filled 2020-02-13: qty 45

## 2020-02-13 MED ORDER — MAGNESIUM SULFATE 4 GM/100ML IV SOLN
4.0000 g | Freq: Once | INTRAVENOUS | Status: AC
  Administered 2020-02-13: 4 g via INTRAVENOUS
  Filled 2020-02-13: qty 100

## 2020-02-13 MED ORDER — FLUCONAZOLE 150 MG PO TABS
150.0000 mg | ORAL_TABLET | Freq: Once | ORAL | Status: AC
  Administered 2020-02-13: 150 mg via ORAL
  Filled 2020-02-13: qty 1

## 2020-02-13 MED ORDER — MAGNESIUM CITRATE PO SOLN: 1.0000 | Freq: Once | ORAL | Status: DC

## 2020-02-13 MED ORDER — POTASSIUM CHLORIDE CRYS ER 20 MEQ PO TBCR
40.0000 meq | EXTENDED_RELEASE_TABLET | Freq: Once | ORAL | Status: AC
  Administered 2020-02-13: 40 meq via ORAL
  Filled 2020-02-13: qty 2

## 2020-02-13 MED ORDER — MAGNESIUM CITRATE PO SOLN
1.0000 | Freq: Once | ORAL | Status: AC
  Administered 2020-02-13: 1 via ORAL
  Filled 2020-02-13: qty 296

## 2020-02-13 NOTE — Plan of Care (Signed)

## 2020-02-13 NOTE — Progress Notes (Addendum)
PROGRESS NOTE    Haley [name]  RAQ:[id number] DOB: [birth year] DOA: 02/08/2020 PCP: Lesleigh Noe, MD    Chief Complaint  Patient presents with  . Abscess    Brief Narrative:  Haley [name] a 34 y.o.femalewithDM noncompliant with metformin, h/o hidradenitis suppurativa, bipolar disorder[facility] with developingworsening boil and swellingon right lower abdomen.   Patient reports that she first noticed a boil in her right lower abdomen almost a week ago. She used leftover doxycycline 100 mg twice daily for 4 days without any significant improvement in boil that kept growing in size with surrounding redness and swelling. Also tried"boil ease","draining salve"that she bought through [employer] without any significant improvement. Been using OTC lidocaine for pain relief.  She now reports fever chills and generalized malaise. She denies any history of MRSA infection reports that last skin infection was in her left axilla that was treated with Keflex in July 2021.  Novomiting or diarrhea.   She is currently single without any children. Smokes 2 cigars daily for past 15 years. Denies any drinking or recreational drug use. She currently works at [employer].  Family history significant for mother with DM and CHF. Dad also has diabetes. Otherwise unremarkable.  Patient seen by general surgery, underwent incision and drainage, cultures pending, placed on IV antibiotics.    Assessment & Plan:   Active Problems:   Hidradenitis suppurativa   Cellulitis   Sepsis (HCC)   Lactic acid acidosis   Hypoalbuminemia   Hyperglycemia   Iron deficiency anemia due to chronic blood loss   Cellulitis of right abdominal wall  1 severe sepsis secondary to abdominal wall abscess/cellulitis, POA On admission patient met criteria for severe sepsis with fever, tachycardia, leukocytosis, lactic acid level of 2.4.  Patient with clinical improvement.  Patient seen in  consultation by general surgery patient subsequently underwent incision and drainage of abdominal wall abscess and soft tissue debridement of abdominal wall 10 x 15 cm down to the level of the fascia per general surgery on 02/09/2020 with cultures positive for actinomyces neuii, polymicrobial.  Patient seen in consultation by ID and IV antibiotics have been narrowed down to IV Unasyn.  ID recommended 2 weeks of IV Unasyn and subsequently on to oral Augmentin.  ID recommended repeat abdominal imaging in 1 to 2 months.  Continue current dressing changes as recommended by general surgery.  General surgery recommending probable wound VAC placement on Monday.  ID, general surgery following and appreciate input and recommendations.   2.  Poorly controlled diabetes mellitus type 2 Hemoglobin A1c 10.8 (02/08/2020).  CBG 144.  Continue Lantus 24 units daily, NovoLog 6 units 3 times daily meal coverage, sliding scale insulin.  Will likely need to go home with insulin.  Continue to hold oral hypoglycemic agents.  Diabetic coordinator following.  3.  Iron deficiency anemia/anemia of chronic disease Hemoglobin currently at 8.8.  Patient with no overt bleeding.  Transfusion threshold hemoglobin < 7.   4.  Hypokalemia/hypomagnesemia K. Dur 40 mEq p.o. x1.  Magnesium sulfate 4 g IV x1.  5.  Chest pain Patient with some complaints of intermittent chest pain.  Questionable anxiety versus musculoskeletal in nature.  EKG done with normal sinus rhythm.  No signs of ischemia noted on EKG.  Currently asymptomatic.  Supportive care.  Follow.  6.  Constipation Continue current bowel regimen of MiraLAX daily, Senokot-S twice daily.  Patient still with no bowel movement.  Soapsuds enema x1.  Magnesium citrate.  Follow.  7.  Vaginal  candidiasis Diflucan 150 mg p.o. x1.  Clotrimazole cream nightly x7 days.    DVT prophylaxis: Lovenox Code Status: Full Family Communication: Updated patient.  No family at  bedside. Disposition:   Status is: Inpatient    Dispo: The patient is from: Home              Anticipated d/c is to: Home              Anticipated d/c date is: 3-4 days              Patient currently on IV antibiotics, cultures pending, likely to have a wound VAC placed on Monday per general surgery, not stable for discharge.       Consultants:   General surgery: Dr. Dema Severin 02/09/2020  ID: Dr. Johnnye Sima 02/11/2020  Procedures:   CT abdomen and pelvis 02/08/2020  Incision and drainage of abdominal wall abscess/soft tissue debridement of abdominal wall 10 x 15 cm down to the level of the fascia per Dr. Dema Severin 02/09/2020  Antimicrobials:   IV cefepime 02/09/2020>>>>02/11/2020  IV clindamycin 9/14/2021x1 dose  IV Flagyl 02/09/2020>>>>> 02/11/2020  IV Zosyn 02/08/2020>>> 02/09/2020  IV vancomycin 02/08/2020>>>> 02/11/2020  IV Unasyn 02/11/2020>>>>   Subjective: Patient patient laying in bed on the telephone.  Denies chest pain or shortness of breath.  Complains of constipation.  Tolerating current diet.  Patient states she thinks she might have a vaginal yeast infection.  Objective: Vitals:   02/13/20 0000 02/13/20 0633 02/13/20 0840 02/13/20 0848  BP:  101/61 139/84 135/80  Pulse:  73 81   Resp: 14     Temp:  98.3 F (36.8 C)    TempSrc:  Oral    SpO2:  96%    Weight:      Height:       No intake or output data in the 24 hours ending 02/13/20 1147 Filed Weights   02/08/20 1208 02/09/20 1255  Weight: 108.9 kg 108.9 kg    Examination:  General exam: NAD Respiratory system: CTA B.  No wheezes, no crackles, no rhonchi.  Normal respiratory effort.  Speaking in full sentences.  Cardiovascular system: Regular rate rhythm no murmurs rubs or gallops.  No JVD.  No lower extremity edema. Gastrointestinal system: Abdomen is soft, nontender, nondistended, positive bowel sounds.  Bandage on abdominal wall.  No rebound.  No guarding. Central nervous system: Alert and oriented. No  focal neurological deficits. Extremities: Symmetric 5 x 5 power. Skin: No rashes, lesions or ulcers Psychiatry: Judgement and insight appear normal. Mood & affect appropriate.          Data Reviewed: I have personally reviewed following labs and imaging studies  CBC: Recent Labs  Lab 02/08/20 1250 02/08/20 1250 02/08/20 2315 02/08/20 2315 02/09/20 0733 02/10/20 0538 02/11/20 0553 02/12/20 0547 02/13/20 0628  WBC 16.7*   < > 18.8*   < > 14.3* 16.5* 12.7* 9.7 8.8  NEUTROABS 13.9*  --  15.4*  --   --   --  9.4*  --  5.3  HGB 10.8*   < > 9.9*   < > 10.0* 10.9* 9.0* 9.2* 8.8*  HCT 33.4*   < > 30.9*   < > 30.3* 34.2* 27.8* 28.6* 27.1*  MCV 82.1   < > 84.0   < > 81.9 83.2 82.5 82.7 82.9  PLT 187   < > 201   < > 199 277 280 301 291   < > = values in this interval not displayed.  Basic Metabolic Panel: Recent Labs  Lab 02/09/20 0733 02/10/20 0538 02/11/20 0553 02/12/20 0547 02/13/20 0628  NA 134* 135 134* 138 139  K 3.2* 3.7 3.3* 3.4* 3.4*  CL 100 101 104 108 107  CO2 22 19* 21* 23 24  GLUCOSE 235* 381* 274* 126* 146*  BUN 5* _0 <5*  CREATININE 0.48 0.53 0.39* 0.42* 0.34*  CALCIUM 8.0* 8.8* 8.2* 8.1* 8.1*  MG  --   --   --   --  1.6*    GFR: Estimated Creatinine Clearance: 119.4 mL/min (A) (by C-G formula based on SCr of 0.34 mg/dL (L)).  Liver Function Tests: Recent Labs  Lab 02/08/20 1250 02/08/20 2315 02/09/20 0733 02/10/20 0538 02/11/20 0553  AST _1 ALT _2 ALKPHOS 85 76 72 93 66  BILITOT 1.2 1.4* 1.7* 1.5* 0.9  PROT 7.7 7.1 6.6 7.6 6.5  ALBUMIN 2.8* 2.6* 2.2* 2.6* 2.1*    CBG: Recent Labs  Lab 02/12/20 1638 02/12/20 1835 02/12/20 2015 02/12/20 2321 02/13/20 0741  GLUCAP 124* 121* 145* 205* 144*     Recent Results (from the past 240 hour(s))  SARS Coronavirus 2 by RT PCR (hospital order, performed in [place] hospital lab) Nasopharyngeal Nasopharyngeal Swab     Status: None   Collection Time: 02/08/20   3:54 PM   Specimen: Nasopharyngeal Swab  Result Value Ref Range Status   SARS Coronavirus 2 NEGATIVE NEGATIVE Final    Comment: (NOTE) SARS-CoV-2 target nucleic acids are NOT DETECTED.  The SARS-CoV-2 RNA is generally detectable in upper and lower respiratory specimens during the acute phase of infection. The lowest concentration of SARS-CoV-2 viral copies this assay can detect is 250 copies / mL. A negative result does not preclude SARS-CoV-2 infection and should not be used as the sole basis for treatment or other patient management decisions.  A negative result may occur with improper specimen collection / handling, submission of specimen other than nasopharyngeal swab, presence of viral mutation(s) within the areas targeted by this assay, and inadequate number of viral copies (<250 copies / mL). A negative result must be combined with clinical observations, patient history, and epidemiological information.  Fact Sheet for Patients:   StrictlyIdeas.no  Fact Sheet for Healthcare Providers: BankingDealers.co.za  This test is not yet approved or  cleared by the [place] FDA and has been authorized for detection and/or diagnosis of SARS-CoV-2 by FDA under an Emergency Use Authorization (EUA).  This EUA will remain in effect (meaning this test can be used) for the duration of the COVID-19 declaration under Section 564(b)(1) of the Act, 21 U.S.C. section 360bbb-3(b)(1), unless the authorization is terminated or revoked sooner.  Performed at [facility], [address] [place], [place] [address]   Blood culture (routine x 2)     Status: None (Preliminary result)   Collection Time: 02/08/20  5:45 PM   Specimen: Right Antecubital; Blood  Result Value Ref Range Status   Specimen Description   Final    RIGHT ANTECUBITAL BLOOD Performed at [facility], [place] [address] [place], [place] [address]    Special  Requests   Final    BOTTLES DRAWN AEROBIC AND ANAEROBIC Blood Culture adequate volume Performed at [facility], [address] [place], [place] [address]    Culture   Final    NO GROWTH 4 DAYS Performed at [facility], [place]  [address] [place], [place] [address]    Report Status PENDING  Incomplete  Aerobic Culture (superficial specimen)     Status: Abnormal   Collection Time: 02/09/20  9:44 AM   Specimen: Abdomen; Wound  Result Value Ref Range Status   Specimen Description   Final    ABDOMEN Performed at [facility] [address] [place], [place] [address]    Special Requests   Final    Normal Performed at [facility], [place] [address] [place], [place] [address]    Gram Stain   Final    RARE WBC PRESENT, PREDOMINANTLY PMN ABUNDANT GRAM POSITIVE COCCI FEW GRAM NEGATIVE RODS    Culture (A)  Final    MULTIPLE ORGANISMS PRESENT, NONE PREDOMINANT NO STAPHYLOCOCCUS AUREUS ISOLATED NO GROUP A STREP (S.PYOGENES) ISOLATED Performed at [facility], [address], [place], [place] [address]    Report Status 02/11/2020 FINAL  Final  Aerobic/Anaerobic Culture (surgical/deep wound)     Status: None   Collection Time: 02/09/20  2:22 PM   Specimen: Abscess  Result Value Ref Range Status   Specimen Description   Final    ABSCESS ABDOMEN Performed at [place] [address], [place], [place] [address]    Special Requests   Final    NONE Performed at [facility], [place] [address] [place], [place] [address]    Gram Stain   Final    ABUNDANT WBC PRESENT, PREDOMINANTLY PMN ABUNDANT GRAM POSITIVE COCCI MODERATE GRAM VARIABLE ROD FEW GRAM NEGATIVE COCCI Performed at [facility], [place] [address], [place], [place] [address]    Culture   Final    FEW ACTINOMYCES NEUII Standardized susceptibility testing for this organism is not available. MIXED ANAEROBIC FLORA PRESENT.   CALL LAB IF FURTHER IID REQUIRED.    Report Status 02/11/2020 FINAL  Final         Radiology Studies: No results found.      Scheduled Meds: . enoxaparin (LOVENOX) injection  55 mg Subcutaneous Q24H  . insulin aspart  0-15 Units Subcutaneous TID WC  . insulin aspart  0-5 Units Subcutaneous QHS  . insulin aspart  6 Units Subcutaneous TID WC  . insulin glargine  24 Units Subcutaneous QHS  . polyethylene glycol  17 g Oral Daily  . senna-docusate  1 tablet Oral BID   Continuous Infusions: . sodium chloride Stopped (02/10/20 1242)  . ampicillin-sulbactam (UNASYN) IV 3 g (02/13/20 1129)     LOS: 5 days    Time spent: 35 minutes    Irine Seal, MD [facility]   To contact the attending provider between 7A-7P or the covering provider during after hours 7P-7A, please log into the web site [email or web address] and access using universal [place] password for that web site. If you do not have the password, please call the hospital operator.  02/13/2020, 11:47 AM

## 2020-02-14 DIAGNOSIS — L732 Hidradenitis suppurativa: Secondary | ICD-10-CM

## 2020-02-14 DIAGNOSIS — E119 Type 2 diabetes mellitus without complications: Secondary | ICD-10-CM

## 2020-02-14 LAB — CBC
HCT: 26.9 % — ABNORMAL LOW (ref 36.0–46.0)
Hemoglobin: 8.9 g/dL — ABNORMAL LOW (ref 12.0–15.0)
MCH: 27 pg (ref 26.0–34.0)
MCHC: 33.1 g/dL (ref 30.0–36.0)
MCV: 81.5 fL (ref 80.0–100.0)
Platelets: 327 10*3/uL (ref 150–400)
RBC: 3.3 MIL/uL — ABNORMAL LOW (ref 3.87–5.11)
RDW: 13.4 % (ref 11.5–15.5)
WBC: 9 10*3/uL (ref 4.0–10.5)
nRBC: 0.2 % (ref 0.0–0.2)

## 2020-02-14 LAB — BASIC METABOLIC PANEL
Anion gap: 8 (ref 5–15)
BUN: <5 mg/dL — ABNORMAL LOW (ref 6–20)
CO2: 23 mmol/L (ref 22–32)
Calcium: 8.4 mg/dL — ABNORMAL LOW (ref 8.9–10.3)
Chloride: 109 mmol/L (ref 98–111)
Creatinine, Ser: 0.38 mg/dL — ABNORMAL LOW (ref 0.44–1.00)
GFR calc Af Amer: >60 mL/min (ref >60)
GFR calc non Af Amer: >60 mL/min (ref >60)
Glucose, Bld: 138 mg/dL — ABNORMAL HIGH (ref 70–99)
Potassium: 4.1 mmol/L (ref 3.5–5.1)
Sodium: 140 mmol/L (ref 135–145)

## 2020-02-14 LAB — MAGNESIUM: Magnesium: 1.8 mg/dL (ref 1.7–2.4)

## 2020-02-14 LAB — GLUCOSE, CAPILLARY
Glucose-Capillary: 126 mg/dL — ABNORMAL HIGH (ref 70–99)
Glucose-Capillary: 206 mg/dL — ABNORMAL HIGH (ref 70–99)
Glucose-Capillary: 128 mg/dL — ABNORMAL HIGH (ref 70–99)
Glucose-Capillary: 144 mg/dL — ABNORMAL HIGH (ref 70–99)

## 2020-02-14 MED ORDER — MAGNESIUM SULFATE 2 GM/50ML IV SOLN
2.0000 g | Freq: Once | INTRAVENOUS | Status: AC
  Administered 2020-02-14: 2 g via INTRAVENOUS
  Filled 2020-02-14: qty 50

## 2020-02-14 NOTE — Progress Notes (Signed)
Pt is very anxious and refusing second IV insertion attempt.

## 2020-02-14 NOTE — Progress Notes (Signed)
Patient lost IV access tonight and is a very difficult stick. IV team came to floor and attempted to get a site.  IV team was unable to successfully place IV. Patient prefers to wait until PICC line placed in am to continue antibiotics. Will notify MD and pharmacy. Priscille Kluver

## 2020-02-14 NOTE — Progress Notes (Addendum)
Patient is ambulating in the halls, tolerating well. Visitor in. Brit given handout about wound vacs and picc lines.

## 2020-02-14 NOTE — Progress Notes (Signed)
PROGRESS NOTE    Haley [name]  NGE:[id number] DOB: [birth year] DOA: 02/08/2020 PCP: Lesleigh Noe, MD    Chief Complaint  Patient presents with   Abscess    Brief Narrative:  Haley [name] a 34 y.o.femalewithDM noncompliant with metformin, h/o hidradenitis suppurativa, bipolar disorder[facility] with developingworsening boil and swellingon right lower abdomen.   Patient reports that she first noticed a boil in her right lower abdomen almost a week ago. She used leftover doxycycline 100 mg twice daily for 4 days without any significant improvement in boil that kept growing in size with surrounding redness and swelling. Also tried"boil ease","draining salve"that she bought through [employer] without any significant improvement. Been using OTC lidocaine for pain relief.  She now reports fever chills and generalized malaise. She denies any history of MRSA infection reports that last skin infection was in her left axilla that was treated with Keflex in July 2021.  Novomiting or diarrhea.   She is currently single without any children. Smokes 2 cigars daily for past 15 years. Denies any drinking or recreational drug use. She currently works at [employer].  Family history significant for mother with DM and CHF. Dad also has diabetes. Otherwise unremarkable.  Patient seen by general surgery, underwent incision and drainage, cultures pending, placed on IV antibiotics.    Assessment & Plan:   Active Problems:   Hidradenitis suppurativa   Cellulitis   Sepsis (HCC)   Lactic acid acidosis   Hypoalbuminemia   Hyperglycemia   Iron deficiency anemia due to chronic blood loss   Cellulitis of right abdominal wall  1 severe sepsis secondary to abdominal wall abscess/cellulitis, POA On admission patient met criteria for severe sepsis with fever, tachycardia, leukocytosis, lactic acid level of 2.4.  Patient with clinical improvement.  Patient seen in  consultation by general surgery patient subsequently underwent incision and drainage of abdominal wall abscess and soft tissue debridement of abdominal wall 10 x 15 cm down to the level of the fascia per general surgery on 02/09/2020 with cultures positive for actinomyces neuii, polymicrobial.  Patient seen in consultation by ID and IV antibiotics have been narrowed down to IV Unasyn.  ID recommended 2 weeks of IV Unasyn and subsequently on to oral Augmentin.  ID recommended repeat abdominal imaging in 1 to 2 months.  Continue current dressing changes as recommended by general surgery.  General surgery recommending probable wound VAC placement hopefully tomorrow. ID, general surgery following and appreciate input and recommendations.   2.  Poorly controlled diabetes mellitus type 2 Hemoglobin A1c 10.8 (02/08/2020).  CBG 126.  Continue Lantus 24 units daily, NovoLog 6 units 3 times daily meal coverage, sliding scale insulin.  Will likely need to go home with insulin.  Continue to hold oral hypoglycemic agents.  Diabetic coordinator following.  3.  Iron deficiency anemia/anemia of chronic disease Hemoglobin currently at 8.9.  Patient with no overt bleeding.  Transfusion threshold hemoglobin < 7.   4.  Hypokalemia/hypomagnesemia Repleted.  Potassium at 4.1.  Magnesium at 1.8.  Magnesium sulfate 2 g IV x1.  Follow.    5.  Chest pain Patient with some complaints of intermittent chest pain.  Questionable anxiety versus musculoskeletal in nature.  EKG done with normal sinus rhythm.  No signs of ischemia noted on EKG.  Currently asymptomatic.  Supportive care.  Follow.  6.  Constipation Patient with results yesterday after magnesium citrate.  Did not require soapsuds enema.  Continue current bowel regimen of MiraLAX daily and Senokot-S twice  daily.  Follow.   7.  Vaginal candidiasis Status post Diflucan 150 mg p.o. x1.  Continue clotrimazole cream nightly x6 more days.     DVT prophylaxis: Lovenox Code  Status: Full Family Communication: Updated patient.  No family at bedside. Disposition:   Status is: Inpatient    Dispo: The patient is from: Home              Anticipated d/c is to: Home              Anticipated d/c date is: 3-4 days              Patient currently on IV antibiotics, cultures pending, likely to have a wound VAC placed on Monday per general surgery, not stable for discharge.       Consultants:   General surgery: Dr. Dema Severin 02/09/2020  ID: Dr. Johnnye Sima 02/11/2020  Procedures:   CT abdomen and pelvis 02/08/2020  Incision and drainage of abdominal wall abscess/soft tissue debridement of abdominal wall 10 x 15 cm down to the level of the fascia per Dr. Dema Severin 02/09/2020  Antimicrobials:   IV cefepime 02/09/2020>>>>02/11/2020  IV clindamycin 9/14/2021x1 dose  IV Flagyl 02/09/2020>>>>> 02/11/2020  IV Zosyn 02/08/2020>>> 02/09/2020  IV vancomycin 02/08/2020>>>> 02/11/2020  IV Unasyn 02/11/2020>>>>   Subjective: Patient in bed on the telephone with his sister.  Denies chest pain or shortness of breath.  Stated her bowel movements.  Tolerating current diet.   Objective: Vitals:   02/13/20 0848 02/13/20 1309 02/13/20 2017 02/14/20 0512  BP: 135/80 123/70 115/63 118/63  Pulse:  76 78 63  Resp:  '20 18 16  ' Temp:  97.9 F (36.6 C) 98.3 F (36.8 C) 97.8 F (36.6 C)  TempSrc:  Oral Oral Oral  SpO2:  99% 100% 98%  Weight:      Height:        Intake/Output Summary (Last 24 hours) at 02/14/2020 1245 Last data filed at 02/14/2020 1108 Gross per 24 hour  Intake 957.22 ml  Output --  Net 957.22 ml   Filed Weights   02/08/20 1208 02/09/20 1255  Weight: 108.9 kg 108.9 kg    Examination:  General exam: NAD Respiratory system: Lungs clear to auscultation bilaterally.  No wheezes, no crackles, no rhonchi.  Normal respiratory effort.  Speaking in full sentences. Cardiovascular system: RRR no murmurs rubs or gallops.  No JVD.  No lower extremity edema.   Gastrointestinal system: Abdomen is soft, nontender, nondistended, positive bowel sounds.  Bandage on abdominal wall.  No rebound.  No guarding.  Central nervous system: Alert and oriented. No focal neurological deficits. Extremities: Symmetric 5 x 5 power. Skin: No rashes, lesions or ulcers Psychiatry: Judgement and insight appear normal. Mood & affect appropriate.          Data Reviewed: I have personally reviewed following labs and imaging studies  CBC: Recent Labs  Lab 02/08/20 1250 02/08/20 1250 02/08/20 2315 02/09/20 0733 02/10/20 0538 02/11/20 0553 02/12/20 0547 02/13/20 0628 02/14/20 0645  WBC 16.7*   < > 18.8*   < > 16.5* 12.7* 9.7 8.8 9.0  NEUTROABS 13.9*  --  15.4*  --   --  9.4*  --  5.3  --   HGB 10.8*   < > 9.9*   < > 10.9* 9.0* 9.2* 8.8* 8.9*  HCT 33.4*   < > 30.9*   < > 34.2* 27.8* 28.6* 27.1* 26.9*  MCV 82.1   < > 84.0   < > 83.2 82.5  82.7 82.9 81.5  PLT 187   < > 201   < > 277 280 301 291 327   < > = values in this interval not displayed.    Basic Metabolic Panel: Recent Labs  Lab 02/10/20 0538 02/11/20 0553 02/12/20 0547 02/13/20 0628 02/14/20 0645  NA 135 134* 138 139 140  K 3.7 3.3* 3.4* 3.4* 4.1  CL 101 104 108 107 109  CO2 19* 21* '23 24 23  ' GLUCOSE 381* 274* 126* 146* 138*  BUN '13 11 7 ' <5* <5*  CREATININE 0.53 0.39* 0.42* 0.34* 0.38*  CALCIUM 8.8* 8.2* 8.1* 8.1* 8.4*  MG  --   --   --  1.6* 1.8    GFR: Estimated Creatinine Clearance: 119.4 mL/min (A) (by C-G formula based on SCr of 0.38 mg/dL (L)).  Liver Function Tests: Recent Labs  Lab 02/08/20 1250 02/08/20 2315 02/09/20 0733 02/10/20 0538 02/11/20 0553  AST '22 15 15 22 18  ' ALT '17 14 13 19 15  ' ALKPHOS 85 76 72 93 66  BILITOT 1.2 1.4* 1.7* 1.5* 0.9  PROT 7.7 7.1 6.6 7.6 6.5  ALBUMIN 2.8* 2.6* 2.2* 2.6* 2.1*    CBG: Recent Labs  Lab 02/13/20 1149 02/13/20 1700 02/13/20 2128 02/14/20 0709 02/14/20 1129  GLUCAP 155* 136* 159* 126* 206*     Recent Results  (from the past 240 hour(s))  SARS Coronavirus 2 by RT PCR (hospital order, performed in [place] hospital lab) Nasopharyngeal Nasopharyngeal Swab     Status: None   Collection Time: 02/08/20  3:54 PM   Specimen: Nasopharyngeal Swab  Result Value Ref Range Status   SARS Coronavirus 2 NEGATIVE NEGATIVE Final    Comment: (NOTE) SARS-CoV-2 target nucleic acids are NOT DETECTED.  The SARS-CoV-2 RNA is generally detectable in upper and lower respiratory specimens during the acute phase of infection. The lowest concentration of SARS-CoV-2 viral copies this assay can detect is 250 copies / mL. A negative result does not preclude SARS-CoV-2 infection and should not be used as the sole basis for treatment or other patient management decisions.  A negative result may occur with improper specimen collection / handling, submission of specimen other than nasopharyngeal swab, presence of viral mutation(s) within the areas targeted by this assay, and inadequate number of viral copies (<250 copies / mL). A negative result must be combined with clinical observations, patient history, and epidemiological information.  Fact Sheet for Patients:   StrictlyIdeas.no  Fact Sheet for Healthcare Providers: BankingDealers.co.za  This test is not yet approved or  cleared by the [place] FDA and has been authorized for detection and/or diagnosis of SARS-CoV-2 by FDA under an Emergency Use Authorization (EUA).  This EUA will remain in effect (meaning this test can be used) for the duration of the COVID-19 declaration under Section 564(b)(1) of the Act, 21 U.S.C. section 360bbb-3(b)(1), unless the authorization is terminated or revoked sooner.  Performed at [facility], [address] [place], [place] [address]   Blood culture (routine x 2)     Status: None   Collection Time: 02/08/20  5:45 PM   Specimen: Right Antecubital; Blood  Result  Value Ref Range Status   Specimen Description   Final    RIGHT ANTECUBITAL BLOOD Performed at [facility], [place] [address] [place], [place] [address]    Special Requests   Final    BOTTLES DRAWN AEROBIC AND ANAEROBIC Blood Culture adequate volume Performed at [facility] [place],  [place], [place] [address]    Culture   Final    NO GROWTH 5 DAYS Performed at [facility], [place] [address] [place], [place] [address]    Report Status 02/13/2020 FINAL  Final  Aerobic Culture (superficial specimen)     Status: Abnormal   Collection Time: 02/09/20  9:44 AM   Specimen: Abdomen; Wound  Result Value Ref Range Status   Specimen Description   Final    ABDOMEN Performed at [place] [address] [place], [place] [address]    Special Requests   Final    Normal Performed at [facility], [place] [address], [place], [place] [address]    Gram Stain   Final    RARE WBC PRESENT, PREDOMINANTLY PMN ABUNDANT GRAM POSITIVE COCCI FEW GRAM NEGATIVE RODS    Culture (A)  Final    MULTIPLE ORGANISMS PRESENT, NONE PREDOMINANT NO STAPHYLOCOCCUS AUREUS ISOLATED NO GROUP A STREP (S.PYOGENES) ISOLATED Performed at [facility], [address], [place], [place] [address]    Report Status 02/11/2020 FINAL  Final  Aerobic/Anaerobic Culture (surgical/deep wound)     Status: None   Collection Time: 02/09/20  2:22 PM   Specimen: Abscess  Result Value Ref Range Status   Specimen Description   Final    ABSCESS ABDOMEN Performed at [place] [address] [place], [place] [address]    Special Requests   Final    NONE Performed at [facility], [place] [address], [place], [place] [address]    Gram Stain   Final    ABUNDANT WBC PRESENT, PREDOMINANTLY PMN ABUNDANT GRAM POSITIVE COCCI MODERATE GRAM VARIABLE ROD FEW GRAM NEGATIVE COCCI Performed at [facility], [place] [address], [place], [place] [address]    Culture   Final    FEW ACTINOMYCES NEUII Standardized susceptibility testing for this organism is not available. MIXED ANAEROBIC FLORA PRESENT.  CALL LAB IF FURTHER IID REQUIRED.    Report Status 02/11/2020 FINAL  Final         Radiology Studies: No results found.      Scheduled Meds:  clotrimazole  1 Applicatorful Vaginal QHS   enoxaparin (LOVENOX) injection  55 mg Subcutaneous Q24H   insulin aspart  0-15 Units Subcutaneous TID WC   insulin aspart  0-5 Units Subcutaneous QHS   insulin aspart  6 Units Subcutaneous TID WC   insulin glargine  24 Units Subcutaneous QHS   magnesium citrate  1 Bottle Oral Once   polyethylene glycol  17 g Oral Daily   senna-docusate  1 tablet Oral BID   Continuous Infusions:  sodium chloride Stopped (02/10/20 1242)   ampicillin-sulbactam (UNASYN) IV 3 g (02/14/20 1237)     LOS: 6 days    Time spent: 35 minutes    Irine Seal, MD [facility]   To contact the attending provider between 7A-7P or the covering provider during after hours 7P-7A, please log into the web site [email or web address] and access using universal [place] password for that web site. If you do not have the password, please call the hospital operator.  02/14/2020, 12:45 PM

## 2020-02-15 ENCOUNTER — Inpatient Hospital Stay: Payer: Self-pay

## 2020-02-15 LAB — CBC
HCT: 26.9 % — ABNORMAL LOW (ref 36.0–46.0)
Hemoglobin: 8.5 g/dL — ABNORMAL LOW (ref 12.0–15.0)
MCH: 27 pg (ref 26.0–34.0)
MCHC: 31.6 g/dL (ref 30.0–36.0)
MCV: 85.4 fL (ref 80.0–100.0)
Platelets: 329 10*3/uL (ref 150–400)
RBC: 3.15 MIL/uL — ABNORMAL LOW (ref 3.87–5.11)
RDW: 13.9 % (ref 11.5–15.5)
WBC: 8.9 10*3/uL (ref 4.0–10.5)
nRBC: 0 % (ref 0.0–0.2)

## 2020-02-15 LAB — MAGNESIUM: Magnesium: 1.7 mg/dL (ref 1.7–2.4)

## 2020-02-15 LAB — GLUCOSE, CAPILLARY
Glucose-Capillary: 129 mg/dL — ABNORMAL HIGH (ref 70–99)
Glucose-Capillary: 142 mg/dL — ABNORMAL HIGH (ref 70–99)
Glucose-Capillary: 157 mg/dL — ABNORMAL HIGH (ref 70–99)
Glucose-Capillary: 197 mg/dL — ABNORMAL HIGH (ref 70–99)

## 2020-02-15 LAB — BASIC METABOLIC PANEL
Anion gap: 9 (ref 5–15)
BUN: <5 mg/dL — ABNORMAL LOW (ref 6–20)
CO2: 26 mmol/L (ref 22–32)
Calcium: 8.4 mg/dL — ABNORMAL LOW (ref 8.9–10.3)
Chloride: 103 mmol/L (ref 98–111)
Creatinine, Ser: 0.4 mg/dL — ABNORMAL LOW (ref 0.44–1.00)
GFR calc Af Amer: >60 mL/min (ref >60)
GFR calc non Af Amer: >60 mL/min (ref >60)
Glucose, Bld: 134 mg/dL — ABNORMAL HIGH (ref 70–99)
Potassium: 3.8 mmol/L (ref 3.5–5.1)
Sodium: 138 mmol/L (ref 135–145)

## 2020-02-15 MED ORDER — SODIUM CHLORIDE 0.9% FLUSH: 10.0000 mL | INTRAVENOUS | Status: DC | PRN

## 2020-02-15 MED ORDER — MAGNESIUM SULFATE 4 GM/100ML IV SOLN
4.0000 g | Freq: Once | INTRAVENOUS | Status: DC
  Filled 2020-02-15: qty 100

## 2020-02-15 MED ORDER — CHLORHEXIDINE GLUCONATE CLOTH 2 % EX PADS
6.0000 | MEDICATED_PAD | Freq: Every day | CUTANEOUS | Status: DC
  Administered 2020-02-15: 6 via TOPICAL

## 2020-02-15 MED ORDER — MAGNESIUM SULFATE 4 GM/100ML IV SOLN
4.0000 g | Freq: Once | INTRAVENOUS | Status: AC
  Administered 2020-02-15: 4 g via INTRAVENOUS
  Filled 2020-02-15: qty 100

## 2020-02-15 NOTE — Progress Notes (Addendum)
6 Days Post-Op    CC: abdominal abscess  Subjective: Doing well, tired of dressing changes.   Objective: Vital signs in last 24 hours: Temp:  [97.6 F (36.4 C)-98.3 F (36.8 C)] 97.6 F (36.4 C) (09/20 0538) Pulse Rate:  [53-71] 53 (09/20 0538) Resp:  [16-18] 16 (09/20 0538) BP: (102-125)/(55-68) 102/55 (09/20 0538) SpO2:  [99 %-100 %] 100 % (09/20 0538) Last BM Date: 02/14/20 114 PO recorded urine x 6 BM x 1 Afebrile, VSS Labs  Glucose 134, creatinine 0.40 WBC 8.9  Intake/Output from previous day: 09/19 0701 - 09/20 0700 In: 114.3 [P.O.:50; I.V.:64.3] Out: -  Intake/Output this shift: No intake/output data recorded.  General appearance: alert, cooperative and no distress Skin: Skin color, texture, turgor normal. No rashes or lesions or see picture below  02/15/20  POD#6 today   9/16 post op day 1 Lab Results:  Recent Labs    02/14/20 0645 02/15/20 0548  WBC 9.0 8.9  HGB 8.9* 8.5*  HCT 26.9* 26.9*  PLT 327 329    BMET Recent Labs    02/14/20 0645 02/15/20 0548  NA 140 138  K 4.1 3.8  CL 109 103  CO2 23 26  GLUCOSE 138* 134*  BUN <5* <5*  CREATININE 0.38* 0.40*  CALCIUM 8.4* 8.4*   PT/INR No results for input(s): LABPROT, INR in the last 72 hours.  Recent Labs  Lab 02/08/20 1250 02/08/20 2315 02/09/20 0733 02/10/20 0538 02/11/20 0553  AST 22 15 15 22 18   ALT 17 14 13 19 15   ALKPHOS 85 76 72 93 66  BILITOT 1.2 1.4* 1.7* 1.5* 0.9  PROT 7.7 7.1 6.6 7.6 6.5  ALBUMIN 2.8* 2.6* 2.2* 2.6* 2.1*     Lipase  No results found for: LIPASE   Medications:  clotrimazole  1 Applicatorful Vaginal QHS   enoxaparin (LOVENOX) injection  55 mg Subcutaneous Q24H   insulin aspart  0-15 Units Subcutaneous TID WC   insulin aspart  0-5 Units Subcutaneous QHS   insulin aspart  6 Units Subcutaneous TID WC   insulin glargine  24 Units Subcutaneous QHS   magnesium citrate  1 Bottle Oral Once   polyethylene glycol  17 g Oral Daily    senna-docusate  1 tablet Oral BID    sodium chloride Stopped (02/10/20 1242)   ampicillin-sulbactam (UNASYN) IV 3 g (02/14/20 1726)    Assessment/Plan Type II diabetes with poor control Hx of hidradenitis suppurativa Hx of psychosis tobacco use Anemia  -  H/H 8.5/26.9 BMI 39.9  Abdominal abscess S/pIncision and drainage of abdominal wall abscessw/Soft tissue debridement of abdominal wall - 10 x 15 cm down to the level of fasciaby Dr. 02/16/20 on 9/14  - POD# 6  - Cx's w/ Actinomyces Neuii. Abx per ID. Currently on Unasyn. Per ID  - Dressing changes, BID WTD - Marked improvement in the wound will ask Wound care to place wound vac.   FEN -Carb modified VTE -SCDs, Lovenox ID -Clindamycin 9/13x 2;>>day ; Zosyn 9/13 - 9/14xday 2; Vancomycin 9/1 - 9/16.; Flagyl 9/14 - 9/16. Maxipime 9/14 - 9/16. Unasyn 9/16>> day 5 ( 14 days Unasyn, the PO Augmentin- ID Dr. 10/14)   Plan:  Wound vac today, she can shower before they place the wound vac.    We can follow up in the office in 2-3 weeks.     LOS: 7 days   JENNINGS,WILLARD 02/15/2020 Please see Amion  Agree with above. Wound doing better.  Key is management of  her DM.  Ovidio Kin, MD, [facility] Surgery Office phone:  [phone]

## 2020-02-15 NOTE — Consult Note (Signed)
WOC Nurse Consult Note: Reason for Consult:Cellulitis with soft tissue debridement 02/09/20.  Vac application today.   Wound type:infectious Pressure Injury POA: NA Measurement: 10 cm x 4 cm x 5.5 cm  Wound bed:10% devitalized tissue 90% ruddy red Drainage (amount, consistency, odor) minimal serosanguinous no odor.  Periwound:intact  Abdominal creasing 3 cm from wound bed.  Ensure drape is pressed firmly in this crease for adequate seal.  Dressing procedure/placement/frequency: Bedside RN to manage. 1 piece black foam.  Covered with drape. Change Mon/Wed/Fri.  Will not follow at this time.  Please re-consult if needed.  Maple Hudson MSN, RN, FNP-BC CWON Wound, Ostomy, Continence Nurse Pager [phone]

## 2020-02-15 NOTE — Progress Notes (Signed)
PROGRESS NOTE    Haley [name]  AVW:[id number] DOB: 1985-12-13 DOA: 02/08/2020 PCP: Lesleigh Noe, MD    Chief Complaint  Patient presents with  . Abscess    Brief Narrative:  Haley [name] a 34 y.o.femalewithDM noncompliant with metformin, h/o hidradenitis suppurativa, bipolar disorder[facility] with developingworsening boil and swellingon right lower abdomen.   Patient reports that she first noticed a boil in her right lower abdomen almost a week ago. She used leftover doxycycline 100 mg twice daily for 4 days without any significant improvement in boil that kept growing in size with surrounding redness and swelling. Also tried"boil ease","draining salve"that she bought through [employer] without any significant improvement. Been using OTC lidocaine for pain relief.  She now reports fever chills and generalized malaise. She denies any history of MRSA infection reports that last skin infection was in her left axilla that was treated with Keflex in July 2021.  Novomiting or diarrhea.   She is currently single without any children. Smokes 2 cigars daily for past 15 years. Denies any drinking or recreational drug use. She currently works at [employer].  Family history significant for mother with DM and CHF. Dad also has diabetes. Otherwise unremarkable.  Patient seen by general surgery, underwent incision and drainage, cultures pending, placed on IV antibiotics.    Assessment & Plan:   Active Problems:   Hidradenitis suppurativa   Cellulitis   Sepsis (HCC)   Lactic acid acidosis   Hypoalbuminemia   Hyperglycemia   Iron deficiency anemia due to chronic blood loss   Cellulitis of right abdominal wall  1 severe sepsis secondary to abdominal wall abscess/cellulitis, POA On admission patient met criteria for severe sepsis with fever, tachycardia, leukocytosis, lactic acid level of 2.4.  Patient with clinical improvement.  Patient seen in  consultation by general surgery patient subsequently underwent incision and drainage of abdominal wall abscess and soft tissue debridement of abdominal wall 10 x 15 cm down to the level of the fascia per general surgery on 02/09/2020 with cultures positive for actinomyces neuii, polymicrobial.  Patient seen in consultation by ID and IV antibiotics have been narrowed down to IV Unasyn.  ID recommended 2 weeks of IV Unasyn and subsequently on to oral Augmentin.  ID recommended repeat abdominal imaging in 1 to 2 months.  Continue current dressing changes as recommended by general surgery.  Wound VAC has been placed this morning.  PICC line placement pending. ID, general surgery following and appreciate input and recommendations.   2.  Poorly controlled diabetes mellitus type 2 Hemoglobin A1c 10.8 (02/08/2020).  CBG 129 this morning.  Continue Lantus 24 units daily, NovoLog 6 units 3 times daily meal coverage, sliding scale insulin.  Will likely need to go home with insulin.  Continue to hold oral hypoglycemic agents.  Diabetic coordinator following.  3.  Iron deficiency anemia/anemia of chronic disease Hemoglobin at 8.5.  No overt bleeding.  Transfusion threshold hemoglobin < 7.  4.  Hypokalemia/hypomagnesemia Repleted.  Potassium at 3.8.  Magnesium at 1.7.  Magnesium sulfate 4 g IV x1.  Follow.   5.  Chest pain Patient with some complaints of intermittent chest pain.  Questionable anxiety versus musculoskeletal in nature.  EKG done with normal sinus rhythm.  No signs of ischemia noted on EKG. no further chest pain noted over the past 48 to 72 hours.  Supportive care.   6.  Constipation Patient with results after magnesium citrate.  Did not require soapsuds enema.  Continue current bowel  regimen of MiraLAX daily and Senokot-S twice daily.  Follow.   7.  Vaginal candidiasis Status post Diflucan 150 mg p.o. x1.  Continue clotrimazole cream nightly x 5 more days.     DVT prophylaxis: Lovenox Code  Status: Full Family Communication: Updated patient.  No family at bedside. Disposition:   Status is: Inpatient    Dispo: The patient is from: Home              Anticipated d/c is to: Home              Anticipated d/c date is: Hopefully tomorrow.              Patient currently on IV antibiotics, wound VAC has been placed, PICC line placement pending.  Not stable for discharge.         Consultants:   General surgery: Dr. Dema Severin 02/09/2020  ID: Dr. Johnnye Sima 02/11/2020  Procedures:   CT abdomen and pelvis 02/08/2020  Incision and drainage of abdominal wall abscess/soft tissue debridement of abdominal wall 10 x 15 cm down to the level of the fascia per Dr. Dema Severin 02/09/2020  PICC line placement pending 02/15/2020  Antimicrobials:   IV cefepime 02/09/2020>>>>02/11/2020  IV clindamycin 9/14/2021x1 dose  IV Flagyl 02/09/2020>>>>> 02/11/2020  IV Zosyn 02/08/2020>>> 02/09/2020  IV vancomycin 02/08/2020>>>> 02/11/2020  IV Unasyn 02/11/2020>>>>   Subjective: Patient laying in bed somewhat drowsy however answering questions appropriately.  Denies any chest pains no shortness of breath.  Complaining of some abdominal pain/discomfort around wound VAC site.  Tolerating current diet.  Patient noted to have IV was dislodged since yesterday evening and currently awaiting for PICC line to be placed and as such has not had antibiotics.  Objective: Vitals:   02/13/20 2017 02/14/20 0512 02/14/20 1418 02/15/20 0538  BP: 115/63 118/63 125/68 (!) 102/55  Pulse: 78 63 71 (!) 53  Resp: _0 Temp: 98.3 F (36.8 C) 97.8 F (36.6 C) 98.3 F (36.8 C) 97.6 F (36.4 C)  TempSrc: Oral Oral Oral Oral  SpO2: 100% 98% 99% 100%  Weight:      Height:        Intake/Output Summary (Last 24 hours) at 02/15/2020 1324 Last data filed at 02/15/2020 1000 Gross per 24 hour  Intake 240 ml  Output 0 ml  Net 240 ml   Filed Weights   02/08/20 1208 02/09/20 1255  Weight: 108.9 kg 108.9 kg     Examination:  General exam: NAD Respiratory system: CTA B.  No wheezes, no crackles, no rhonchi.  Normal respiratory effort.  Speaking in full sentences.  Cardiovascular system: Regular rate rhythm no murmurs rubs or gallops.  No JVD.  No lower extremity edema. Gastrointestinal system: Abdomen soft, nontender, nondistended, positive bowel sounds.  Wound VAC in place.  No rebound.  No guarding.  Central nervous system: Alert and oriented. No focal neurological deficits. Extremities: Symmetric 5 x 5 power. Skin: No rashes, lesions or ulcers Psychiatry: Judgement and insight appear normal. Mood & affect appropriate.          Data Reviewed: I have personally reviewed following labs and imaging studies  CBC: Recent Labs  Lab 02/08/20 2315 02/09/20 0733 02/11/20 0553 02/12/20 0547 02/13/20 0628 02/14/20 0645 02/15/20 0548  WBC 18.8*   < > 12.7* 9.7 8.8 9.0 8.9  NEUTROABS 15.4*  --  9.4*  --  5.3  --   --   HGB 9.9*   < > 9.0* 9.2* 8.8* 8.9* 8.5*  HCT 30.9*   < > 27.8* 28.6* 27.1* 26.9* 26.9*  MCV 84.0   < > 82.5 82.7 82.9 81.5 85.4  PLT 201   < > 280 301 291 327 329   < > = values in this interval not displayed.    Basic Metabolic Panel: Recent Labs  Lab 02/11/20 0553 02/12/20 0547 02/13/20 0628 02/14/20 0645 02/15/20 0548  NA 134* 138 139 140 138  K 3.3* 3.4* 3.4* 4.1 3.8  CL 104 108 107 109 103  CO2 21* _0 GLUCOSE 274* 126* 146* 138* 134*  BUN 11 7 <5* <5* <5*  CREATININE 0.39* 0.42* 0.34* 0.38* 0.40*  CALCIUM 8.2* 8.1* 8.1* 8.4* 8.4*  MG  --   --  1.6* 1.8 1.7    GFR: Estimated Creatinine Clearance: 119.4 mL/min (A) (by C-G formula based on SCr of 0.4 mg/dL (L)).  Liver Function Tests: Recent Labs  Lab 02/08/20 2315 02/09/20 0733 02/10/20 0538 02/11/20 0553  AST _1 ALT _2 ALKPHOS 76 72 93 66  BILITOT 1.4* 1.7* 1.5* 0.9  PROT 7.1 6.6 7.6 6.5  ALBUMIN 2.6* 2.2* 2.6* 2.1*    CBG: Recent Labs  Lab  02/14/20 1129 02/14/20 1632 02/14/20 2059 02/15/20 0730 02/15/20 1144  GLUCAP 206* 128* 144* 129* 142*     Recent Results (from the past 240 hour(s))  SARS Coronavirus 2 by RT PCR (hospital order, performed in [facility] hospital lab) Nasopharyngeal Nasopharyngeal Swab     Status: None   Collection Time: 02/08/20  3:54 PM   Specimen: Nasopharyngeal Swab  Result Value Ref Range Status   SARS Coronavirus 2 NEGATIVE NEGATIVE Final    Comment: (NOTE) SARS-CoV-2 target nucleic acids are NOT DETECTED.  The SARS-CoV-2 RNA is generally detectable in upper and lower respiratory specimens during the acute phase of infection. The lowest concentration of SARS-CoV-2 viral copies this assay can detect is 250 copies / mL. A negative result does not preclude SARS-CoV-2 infection and should not be used as the sole basis for treatment or other patient management decisions.  A negative result may occur with improper specimen collection / handling, submission of specimen other than nasopharyngeal swab, presence of viral mutation(s) within the areas targeted by this assay, and inadequate number of viral copies (<250 copies / mL). A negative result must be combined with clinical observations, patient history, and epidemiological information.  Fact Sheet for Patients:   StrictlyIdeas.no  Fact Sheet for Healthcare Providers: BankingDealers.co.za  This test is not yet approved or  cleared by the [place] FDA and has been authorized for detection and/or diagnosis of SARS-CoV-2 by FDA under an Emergency Use Authorization (EUA).  This EUA will remain in effect (meaning this test can be used) for the duration of the COVID-19 declaration under Section 564(b)(1) of the Act, 21 U.S.C. section 360bbb-3(b)(1), unless the authorization is terminated or revoked sooner.  Performed at [facility], [address] [place], [place] [address]    Blood culture (routine x 2)     Status: None   Collection Time: 02/08/20  5:45 PM   Specimen: Right Antecubital; Blood  Result Value Ref Range Status   Specimen Description   Final    RIGHT ANTECUBITAL BLOOD Performed at [facility], [place] [address] [place], [place] [address]    Special Requests   Final    BOTTLES DRAWN AEROBIC AND ANAEROBIC Blood Culture adequate volume Performed  at [facility], [address] [place], [place] [address]    Culture   Final    NO GROWTH 5 DAYS Performed at [facility], [place] [address], [place], [place] [address]    Report Status 02/13/2020 FINAL  Final  Aerobic Culture (superficial specimen)     Status: Abnormal   Collection Time: 02/09/20  9:44 AM   Specimen: Abdomen; Wound  Result Value Ref Range Status   Specimen Description   Final    ABDOMEN Performed at [place] [address] [place], [place] [address]    Special Requests   Final    Normal Performed at [facility], [place] [address], [place], [place] [address]    Gram Stain   Final    RARE WBC PRESENT, PREDOMINANTLY PMN ABUNDANT GRAM POSITIVE COCCI FEW GRAM NEGATIVE RODS    Culture (A)  Final    MULTIPLE ORGANISMS PRESENT, NONE PREDOMINANT NO STAPHYLOCOCCUS AUREUS ISOLATED NO GROUP A STREP (S.PYOGENES) ISOLATED Performed at [facility], [address], [place], [place] [address]    Report Status 02/11/2020 FINAL  Final  Aerobic/Anaerobic Culture (surgical/deep wound)     Status: None   Collection Time: 02/09/20  2:22 PM   Specimen: Abscess  Result Value Ref Range Status   Specimen Description   Final    ABSCESS ABDOMEN Performed at [place] [address], [place], [place] [address]    Special Requests   Final    NONE Performed at [facility], [place] [address] [place], [place] [address]    Gram Stain   Final    ABUNDANT WBC PRESENT, PREDOMINANTLY  PMN ABUNDANT GRAM POSITIVE COCCI MODERATE GRAM VARIABLE ROD FEW GRAM NEGATIVE COCCI Performed at [facility], [place] [address] [place], [place] [address]    Culture   Final    FEW ACTINOMYCES NEUII Standardized susceptibility testing for this organism is not available. MIXED ANAEROBIC FLORA PRESENT.  CALL LAB IF FURTHER IID REQUIRED.    Report Status 02/11/2020 FINAL  Final         Radiology Studies: [place] EKG SITE RITE  Result Date: 02/15/2020 If Site Rite image not attached, placement could not be confirmed due to current cardiac rhythm.       Scheduled Meds: . clotrimazole  1 Applicatorful Vaginal QHS  . enoxaparin (LOVENOX) injection  55 mg Subcutaneous Q24H  . insulin aspart  0-15 Units Subcutaneous TID WC  . insulin aspart  0-5 Units Subcutaneous QHS  . insulin aspart  6 Units Subcutaneous TID WC  . insulin glargine  24 Units Subcutaneous QHS  . polyethylene glycol  17 g Oral Daily  . senna-docusate  1 tablet Oral BID   Continuous Infusions: . sodium chloride Stopped (02/10/20 1242)  . ampicillin-sulbactam (UNASYN) IV 3 g (02/14/20 1726)  . magnesium sulfate bolus IVPB       LOS: 7 days    Time spent: 35 minutes    Irine Seal, MD [facility]   To contact the attending provider between 7A-7P or the covering provider during after hours 7P-7A, please log into the web site [email or web address] and access using universal [place] password for that web site. If you do not have the password, please call the hospital operator.  02/15/2020, 1:24 PM

## 2020-02-15 NOTE — Progress Notes (Signed)
INFECTIOUS DISEASE PROGRESS NOTE  ID: Haley [name] is a 34 y.o. female with  Active Problems:   Hidradenitis suppurativa   Cellulitis   Sepsis (HCC)   Lactic acid acidosis   Hypoalbuminemia   Hyperglycemia   Iron deficiency anemia due to chronic blood loss   Cellulitis of right abdominal wall  Subjective: Feels better, less pain.   Abtx:  Anti-infectives (From admission, onward)   Start     Dose/Rate Route Frequency Ordered Stop   02/13/20 1700  fluconazole (DIFLUCAN) tablet 150 mg        150 mg Oral  Once 02/13/20 1619 02/13/20 1646   02/11/20 1700  Ampicillin-Sulbactam (UNASYN) 3 g in sodium chloride 0.9 % 100 mL IVPB        3 g 200 mL/hr over 30 Minutes Intravenous Every 6 hours 02/11/20 1526     02/09/20 1600  metroNIDAZOLE (FLAGYL) IVPB 500 mg  Status:  Discontinued        500 mg 100 mL/hr over 60 Minutes Intravenous Every 8 hours 02/09/20 1021 02/11/20 1526   02/09/20 1600  ceFEPIme (MAXIPIME) 2 g in sodium chloride 0.9 % 100 mL IVPB  Status:  Discontinued        2 g 200 mL/hr over 30 Minutes Intravenous Every 8 hours 02/09/20 1021 02/11/20 1526   02/09/20 0615  clindamycin (CLEOCIN) IVPB 600 mg  Status:  Discontinued        600 mg 100 mL/hr over 30 Minutes Intravenous Every 8 hours 02/09/20 0607 02/09/20 1021   02/08/20 1800  piperacillin-tazobactam (ZOSYN) IVPB 3.375 g  Status:  Discontinued        3.375 g 12.5 mL/hr over 240 Minutes Intravenous Every 8 hours 02/08/20 1625 02/09/20 1021   02/08/20 1615  clindamycin (CLEOCIN) IVPB 600 mg        600 mg 100 mL/hr over 30 Minutes Intravenous  Once 02/08/20 1614 02/08/20 1809   02/08/20 1600  vancomycin (VANCOCIN) IVPB 1000 mg/200 mL premix  Status:  Discontinued        1,000 mg 200 mL/hr over 60 Minutes Intravenous Every 12 hours 02/08/20 1559 02/11/20 1526      Medications:  Scheduled:  Chlorhexidine Gluconate Cloth  6 each Topical Daily   clotrimazole  1 Applicatorful Vaginal QHS   enoxaparin (LOVENOX)  injection  55 mg Subcutaneous Q24H   insulin aspart  0-15 Units Subcutaneous TID WC   insulin aspart  0-5 Units Subcutaneous QHS   insulin aspart  6 Units Subcutaneous TID WC   insulin glargine  24 Units Subcutaneous QHS   polyethylene glycol  17 g Oral Daily   senna-docusate  1 tablet Oral BID    Objective: Vital signs in last 24 hours: Temp:  [97.6 F (36.4 C)-98.3 F (36.8 C)] 98.3 F (36.8 C) (09/20 1337) Pulse Rate:  [53-60] 60 (09/20 1337) Resp:  [16-18] 18 (09/20 1337) BP: (102-123)/(55-71) 123/71 (09/20 1337) SpO2:  [98 %-100 %] 98 % (09/20 1337)   General appearance: alert, cooperative and no distress Resp: clear to auscultation bilaterally Cardio: regular rate and rhythm GI: normal findings: bowel sounds normal and soft, non-tender Extremities: RUE [facility]  Lab Results Recent Labs    02/14/20 0645 02/15/20 0548  WBC 9.0 8.9  HGB 8.9* 8.5*  HCT 26.9* 26.9*  NA 140 138  K 4.1 3.8  CL 109 103  CO2 23 26  BUN <5* <5*  CREATININE 0.38* 0.40*   Liver Panel No results for input(s): PROT, ALBUMIN,  AST, ALT, ALKPHOS, BILITOT, BILIDIR, IBILI in the last 72 hours. Sedimentation Rate No results for input(s): ESRSEDRATE in the last 72 hours. C-Reactive Protein No results for input(s): CRP in the last 72 hours.  Microbiology: Recent Results (from the past 240 hour(s))  SARS Coronavirus 2 by RT PCR (hospital order, performed in [facility] hospital lab) Nasopharyngeal Nasopharyngeal Swab     Status: None   Collection Time: 02/08/20  3:54 PM   Specimen: Nasopharyngeal Swab  Result Value Ref Range Status   SARS Coronavirus 2 NEGATIVE NEGATIVE Final    Comment: (NOTE) SARS-CoV-2 target nucleic acids are NOT DETECTED.  The SARS-CoV-2 RNA is generally detectable in upper and lower respiratory specimens during the acute phase of infection. The lowest concentration of SARS-CoV-2 viral copies this assay can detect is 250 copies / mL. A negative result does not  preclude SARS-CoV-2 infection and should not be used as the sole basis for treatment or other patient management decisions.  A negative result may occur with improper specimen collection / handling, submission of specimen other than nasopharyngeal swab, presence of viral mutation(s) within the areas targeted by this assay, and inadequate number of viral copies (<250 copies / mL). A negative result must be combined with clinical observations, patient history, and epidemiological information.  Fact Sheet for Patients:   BoilerBrush.com.cy  Fact Sheet for Healthcare Providers: https://pope.com/  This test is not yet approved or  cleared by the [place] FDA and has been authorized for detection and/or diagnosis of SARS-CoV-2 by FDA under an Emergency Use Authorization (EUA).  This EUA will remain in effect (meaning this test can be used) for the duration of the COVID-19 declaration under Section 564(b)(1) of the Act, 21 U.S.C. section 360bbb-3(b)(1), unless the authorization is terminated or revoked sooner.  Performed at [facility], [address] [place], [place] [address]   Blood culture (routine x 2)     Status: None   Collection Time: 02/08/20  5:45 PM   Specimen: Right Antecubital; Blood  Result Value Ref Range Status   Specimen Description   Final    RIGHT ANTECUBITAL BLOOD Performed at [facility], [address], [place], [place] [address]    Special Requests   Final    BOTTLES DRAWN AEROBIC AND ANAEROBIC Blood Culture adequate volume Performed at [facility], [address] [place], [place] [address]    Culture   Final    NO GROWTH 5 DAYS Performed at [facility], [address] [place], [place] [address]    Report Status 02/13/2020 FINAL  Final  Aerobic Culture (superficial specimen)     Status: Abnormal   Collection Time: 02/09/20  9:44 AM   Specimen: Abdomen; Wound   Result Value Ref Range Status   Specimen Description   Final    ABDOMEN Performed at [facility], [address] [place], [place] [address]    Special Requests   Final    Normal Performed at [facility], [address], [place], [place] [address]    Gram Stain   Final    RARE WBC PRESENT, PREDOMINANTLY PMN ABUNDANT GRAM POSITIVE COCCI FEW GRAM NEGATIVE RODS    Culture (A)  Final    MULTIPLE ORGANISMS PRESENT, NONE PREDOMINANT NO STAPHYLOCOCCUS AUREUS ISOLATED NO GROUP A STREP (S.PYOGENES) ISOLATED Performed at [facility], [address], [place], [place] [address]    Report Status 02/11/2020 FINAL  Final  Aerobic/Anaerobic Culture (surgical/deep wound)     Status: None   Collection Time: 02/09/20  2:22 PM   Specimen: Abscess  Result Value Ref Range Status   Specimen Description   Final    ABSCESS ABDOMEN Performed at [facility], [address], [place], [place] [address]    Special Requests   Final    NONE Performed at [facility], [address], [place], [place] [address]    Gram Stain   Final    ABUNDANT WBC PRESENT, PREDOMINANTLY PMN ABUNDANT GRAM POSITIVE COCCI MODERATE GRAM VARIABLE ROD FEW GRAM NEGATIVE COCCI Performed at [facility], [address] [place], [place] [address]    Culture   Final    FEW ACTINOMYCES NEUII Standardized susceptibility testing for this organism is not available. MIXED ANAEROBIC FLORA PRESENT.  CALL LAB IF FURTHER IID REQUIRED.    Report Status 02/11/2020 FINAL  Final    Studies/Results: [place] EKG SITE RITE  Result Date: 02/15/2020 If Site Rite image not attached, placement could not be confirmed due to current cardiac rhythm.    Assessment/Plan: abd wall abscess             Actinomyces neuii, poly-microbial DM1 uncontrolled Hidradenitis suppurativa constipation  Total days of antibiotics: 7 unasyn                                                               Continue unasyn for 2 weeks then onto po augmentin of 4 more months.  Repeat abd imaging in 1-2 months Wound vac per surgery.  Diabetic control fair in hospital.  Please have her f/u in ID clinic with me in 1 month Available as needed.          Johny Sax MD, FACP Infectious Diseases (pager) [phone] [email or web address] 02/15/2020, 5:19 PM  LOS: 7 days

## 2020-02-15 NOTE — TOC Initial Note (Signed)
Transition of Care [facility]) - Initial/Assessment Note    Patient Details  Name: Haley [name] MRN: [id number] Date of Birth: [birth year]  Transition [facility] Care [facility]) CM/SW Contact:    Dontavis Tschantz, Meriam Sprague, RN Phone Number: 02/15/2020, 3:11 PM  Clinical Narrative:                 TOC consult for home IV abx and [employer] home wound VAC. This CM was contacted by PAM from [employer] who will be providing home antibotic infusion services at home. [employer] will be the home health company providing [facility]. This CM Received [employer] wound vac form from [employer]. This was then signed by surgery PA Will Marlyne Beards. Form was faxed back to [facility]. Home vac will be delivered to the hospital tomorrow. TOC will continue to follow.  Expected Discharge Plan: Home w Home Health Services Barriers to Discharge: Continued Medical Work up   Expected Discharge Plan and Services Expected Discharge Plan: Home w Home Health Services   Discharge Planning Services: CM Consult                     DME Arranged: Vac DME Agency: [employer]       HH Arranged: RN, IV Antibiotics HH Agency: [facility] Date [facility] Agency Contacted: 02/15/20 Time HH Agency Contacted: 1100 Representative spoke with at [facility] Agency: Kandee Keen  Activities of Daily Living Home Assistive Devices/Equipment: None ADL Screening (condition at time of admission) Patient's cognitive ability adequate to safely complete daily activities?: Yes Is the patient deaf or have difficulty hearing?: No Does the patient have difficulty seeing, even when wearing glasses/contacts?: No Does the patient have difficulty concentrating, remembering, or making decisions?: No Patient able to express need for assistance with ADLs?: No Does the patient have difficulty dressing or bathing?: No Independently performs ADLs?: Yes (appropriate for developmental age) Does the patient have difficulty walking or climbing stairs?: No Weakness of Legs: None Weakness of Arms/Hands:  None    Admission diagnosis:  Cellulitis [L03.90] Cellulitis of right abdominal wall [L03.311] Patient Active Problem List   Diagnosis Date Noted  . Cellulitis of right abdominal wall   . Cellulitis 02/08/2020  . Sepsis (HCC) 02/08/2020  . Lactic acid acidosis 02/08/2020  . Hypoalbuminemia 02/08/2020  . Hyperglycemia 02/08/2020  . Iron deficiency anemia due to chronic blood loss 02/08/2020  . Type 2 diabetes mellitus without complication, without long-term current use of insulin (HCC) 11/12/2019  . Hidradenitis suppurativa 11/12/2019   PCP:  Lynnda Child, MD Pharmacy:   [facility] [address], [place] - [address] [place] [address] Phone: [phone] Fax: [phone]     Social Determinants of Health (SDOH) Interventions    Readmission Risk Interventions No flowsheet data found.

## 2020-02-15 NOTE — Progress Notes (Signed)
Peripherally Inserted Central Catheter Placement  The IV Nurse has discussed with the patient and/or persons authorized to consent for the patient, the purpose of this procedure and the potential benefits and risks involved with this procedure.  The benefits include less needle sticks, lab draws from the catheter, and the patient may be discharged home with the catheter. Risks include, but not limited to, infection, bleeding, blood clot (thrombus formation), and puncture of an artery; nerve damage and irregular heartbeat and possibility to perform a PICC exchange if needed/ordered by physician.  Alternatives to this procedure were also discussed.  B[employer] Power PICC patient education guide, fact sheet on infection prevention and patient information card has been provided to patient /or left at bedside.    PICC Placement Documentation  PICC Single Lumen 02/15/20 PICC Right Basilic 40 cm 2 cm (Active)  Indication for Insertion or Continuance of Line Home intravenous therapies (PICC only) 02/15/20 1546  Exposed Catheter (cm) 2 cm 02/15/20 1546  Site Assessment Clean;Dry;Intact 02/15/20 1546  Line Status Capped (central line);Blood return noted;Flushed 02/15/20 1546  Dressing Type Transparent;Occlusive 02/15/20 1546  Dressing Status Intact;Dry;Clean 02/15/20 1546  Antimicrobial disc in place? Yes 02/15/20 1546  Safety Lock Not Applicable 02/15/20 1546  Line Care Connections checked and tightened 02/15/20 1546  Line Adjustment (NICU/IV Team Only) No 02/15/20 1546  Dressing Intervention New dressing 02/15/20 1546  Dressing Change Due 02/22/20 02/15/20 1546       Burnard Bunting Chenice 02/15/2020, 3:47 PM

## 2020-02-16 DIAGNOSIS — L02211 Cutaneous abscess of abdominal wall: Secondary | ICD-10-CM | POA: Diagnosis present

## 2020-02-16 DIAGNOSIS — A419 Sepsis, unspecified organism: Secondary | ICD-10-CM | POA: Diagnosis not present

## 2020-02-16 LAB — BASIC METABOLIC PANEL
Anion gap: 8 (ref 5–15)
BUN: 5 mg/dL — ABNORMAL LOW (ref 6–20)
CO2: 27 mmol/L (ref 22–32)
Calcium: 8.4 mg/dL — ABNORMAL LOW (ref 8.9–10.3)
Chloride: 104 mmol/L (ref 98–111)
Creatinine, Ser: 0.36 mg/dL — ABNORMAL LOW (ref 0.44–1.00)
GFR calc Af Amer: >60 mL/min (ref >60)
GFR calc non Af Amer: >60 mL/min (ref >60)
Glucose, Bld: 146 mg/dL — ABNORMAL HIGH (ref 70–99)
Potassium: 3.8 mmol/L (ref 3.5–5.1)
Sodium: 139 mmol/L (ref 135–145)

## 2020-02-16 LAB — CBC WITH DIFFERENTIAL/PLATELET
Abs Immature Granulocytes: 0.23 10*3/uL — ABNORMAL HIGH (ref 0.00–0.07)
Basophils Absolute: 0 10*3/uL (ref 0.0–0.1)
Basophils Relative: 0 %
Eosinophils Absolute: 0.2 10*3/uL (ref 0.0–0.5)
Eosinophils Relative: 3 %
HCT: 25.8 % — ABNORMAL LOW (ref 36.0–46.0)
Hemoglobin: 8.1 g/dL — ABNORMAL LOW (ref 12.0–15.0)
Immature Granulocytes: 3 %
Lymphocytes Relative: 38 %
Lymphs Abs: 3 10*3/uL (ref 0.7–4.0)
MCH: 27 pg (ref 26.0–34.0)
MCHC: 31.4 g/dL (ref 30.0–36.0)
MCV: 86 fL (ref 80.0–100.0)
Monocytes Absolute: 0.5 10*3/uL (ref 0.1–1.0)
Monocytes Relative: 6 %
Neutro Abs: 4 10*3/uL (ref 1.7–7.7)
Neutrophils Relative %: 50 %
Platelets: 322 10*3/uL (ref 150–400)
RBC: 3 MIL/uL — ABNORMAL LOW (ref 3.87–5.11)
RDW: 14.5 % (ref 11.5–15.5)
WBC: 7.9 10*3/uL (ref 4.0–10.5)
nRBC: 0 % (ref 0.0–0.2)

## 2020-02-16 LAB — GLUCOSE, CAPILLARY
Glucose-Capillary: 140 mg/dL — ABNORMAL HIGH (ref 70–99)
Glucose-Capillary: 144 mg/dL — ABNORMAL HIGH (ref 70–99)
Glucose-Capillary: 97 mg/dL (ref 70–99)

## 2020-02-16 LAB — MAGNESIUM: Magnesium: 1.9 mg/dL (ref 1.7–2.4)

## 2020-02-16 MED ORDER — HEPARIN SOD (PORK) LOCK FLUSH 100 UNIT/ML IV SOLN
250.0000 [IU] | INTRAVENOUS | Status: DC | PRN
  Administered 2020-02-16: 250 [IU]
  Filled 2020-02-16: qty 5

## 2020-02-16 MED ORDER — POLYETHYLENE GLYCOL 3350 17 G PO PACK
17.0000 g | PACK | Freq: Every day | ORAL | 0 refills | Status: DC | PRN
Start: 2020-02-16 — End: 2020-04-12

## 2020-02-16 MED ORDER — SENNOSIDES-DOCUSATE SODIUM 8.6-50 MG PO TABS: 1.0000 | ORAL_TABLET | Freq: Two times a day (BID) | ORAL | Status: DC

## 2020-02-16 MED ORDER — SODIUM CHLORIDE 0.9% FLUSH: 10.0000 mL | INTRAVENOUS | Status: DC | PRN

## 2020-02-16 MED ORDER — LANTUS SOLOSTAR 100 UNIT/ML ~~LOC~~ SOPN: 24.0000 [IU] | PEN_INJECTOR | Freq: Every day | SUBCUTANEOUS | 0 refills | Status: DC

## 2020-02-16 MED ORDER — AMPICILLIN-SULBACTAM IV (FOR PTA / DISCHARGE USE ONLY): 3.0000 g | Freq: Four times a day (QID) | INTRAVENOUS | 0 refills | Status: AC

## 2020-02-16 MED ORDER — CLOTRIMAZOLE 1 % VA CREA: 1.0000 | TOPICAL_CREAM | Freq: Every day | VAGINAL | 0 refills | Status: AC

## 2020-02-16 MED ORDER — SODIUM CHLORIDE 0.9% FLUSH: 10.0000 mL | Freq: Two times a day (BID) | INTRAVENOUS | Status: DC

## 2020-02-16 MED ORDER — INSULIN PEN NEEDLE 32G X 4 MM MISC: 24.0000 [IU] | Freq: Every day | 0 refills | Status: DC

## 2020-02-16 MED ORDER — HEPARIN SOD (PORK) LOCK FLUSH 100 UNIT/ML IV SOLN: 250.0000 [IU] | Freq: Every day | INTRAVENOUS | Status: DC

## 2020-02-16 MED ORDER — AMOXICILLIN-POT CLAVULANATE 875-125 MG PO TABS: 1.0000 | ORAL_TABLET | Freq: Two times a day (BID) | ORAL | 3 refills | Status: DC

## 2020-02-16 MED ORDER — OXYCODONE-ACETAMINOPHEN 5-325 MG PO TABS: 1.0000 | ORAL_TABLET | ORAL | 0 refills | Status: DC | PRN

## 2020-02-16 MED ORDER — BLOOD GLUCOSE METER KIT: PACK | 0 refills | Status: DC

## 2020-02-16 NOTE — Discharge Instructions (Signed)
Negative Pressure Wound Therapy Home Guide °Negative pressure wound therapy (NPWT) uses a sponge or foam-like material (dressing) placed on or inside the wound. The wound is then covered and sealed with a cover dressing that sticks to your skin (is adhesive). This keeps air out. A tube is attached to the cover dressing, and this tube connects to a small pump. The pump sucks fluid and germs from the wound. NPWT helps to increase blood flow to the wound and heal it from the inside. °What are the risks? °NPWT is usually safe to use. However, problems can occur, including: °· Skin irritation from the dressing adhesive. °· Bleeding. °· Infection. °· Dehydration. Wounds with large amounts of drainage can cause excessive fluid loss. °· Pain. °Supplies needed: °· A disposable garbage bag. °· Soap and water, or hand sanitizer. °· Wound cleanser or salt-water solution (saline). °· New sponge and cover dressing. °· Protective clothing. °· Gauze pad. °· Vinyl gloves. °· Tape. °· Skin protectant. This may be a wipe, film, or spray. °· Clean or germ-free (sterile) scissors. °· Eye protection. °How to change your dressing °Prepare to change your dressing ° °1. If told by your health care provider, take pain medicine 30 minutes before changing the dressing. °2. Wash your hands with soap and water. Dry your hands with a clean towel. If soap and water are not available, use hand sanitizer. °3. Set up a clean station for wound care. °4. Open the dressing package so that the sponge dressing remains on the inside of the package. °5. Wear gloves, protective clothing, and eye protection. °Remove old dressing ° °1. Turn off the pump and disconnect the tubing from the dressing. °2. Carefully remove the adhesive cover dressing in the direction of your hair growth. °3. Remove the sponge dressing that is inside the wound. If the sponge sticks, use a wound cleanser or saline solution to wet the sponge and help it come off more easily. °4. Throw  the old sponge and cover dressing supplies into the garbage bag. °5. Remove your gloves by grabbing the cuff and turning the glove inside out. Place the gloves in the trash immediately. °6. Wash your hands with soap and water. Dry your hands with a clean towel. If soap and water are not available, use hand sanitizer. °Clean your wound °· Wear gloves, protective clothing, and eye protection. °Follow your health care provider's instructions on how to clean your wound. You may be told to: °1. Clean the wound using a saline solution or a wound cleanser and a clean gauze pad. °2. Pat the wound dry with a gauze pad. Do not rub the wound. °3. Throw the gauze pad into the garbage bag. °4. Remove your gloves by grabbing the cuff and turning the glove inside out. Place the gloves in the trash immediately. °5. Wash your hands with soap and water. Dry your hands with a clean towel. If soap and water are not available, use hand sanitizer. °Apply new dressing °· Wear gloves, protective clothing, and eye protection. °1. If told by your health care provider, apply a skin protectant to any skin that will be exposed to adhesive. Let the skin protectant dry. °2. Cut a piece of new sponge dressing and put it on or in the wound. °3. Using clean scissors, cut a nickel-sized hole in the new cover dressing. °4. Apply the cover dressing. °5. Attach the suction tube over the hole in the cover dressing. °6. Take off your gloves. Put them in the   plastic bag with the old dressing. Tie the bag shut and throw it away. °7. Wash your hands with soap and water. Dry your hands with a clean towel. If soap and water are not available, use hand sanitizer. °8. Turn the pump back on. The sponge dressing should collapse. Do not change the settings on the machine without talking to a health care provider. °9. Replace the container in the pump that collects fluid if it is full. Replace the container per the manufacturer's instructions or at least once a  week, even if it is not full. °General tips and recommendations °If the alarm sounds: °· Stay calm. °· Do not turn off the pump or do anything with the dressing. °· Reasons the alarm may go off: °? The battery is low. Change the battery or plug the device into electrical power. °? The dressing has a leak. Find the leak and put tape over the leak. °? The fluid collection container is full. Change the fluid container. °· Call your health care provider right away if you cannot fix the problem. °· Explain to your health care provider what is happening. Follow his or her instructions. °General instructions °· Do not turn off the pump unless told to do so by your health care provider. °· Do not turn off the pump for more than 2 hours. If the pump is off for more than 2 hours, the dressing will need to be changed. °· If your health care provider says it is okay to shower: °? Do not take the pump into the shower. °? Make sure the wound dressing is protected and sealed. The wound dressing must stay dry. °· Check frequently that the machine indicates that therapy is on and that all clamps are open. °· Do not use over-the-counter medicated or antiseptic creams, sprays, liquids, or dressings unless your health care provider approves. °Contact a health care provider if: °· You have new pain. °· You develop irritation, a rash, or itching around the wound or dressing. °· You see new black or yellow tissue in your wound. °· The dressing changes are painful or cause bleeding. °· The pump has been off for more than 2 hours, and you do not know how to change the dressing. °· The pump alarm goes off, and you do not know what to do. °Get help right away if: °· You have a lot of bleeding. °· The wound breaks open. °· You have severe pain. °· You have signs of infection, such as: °? More redness, swelling, or pain. °? More fluid or blood. °? Warmth. °? Pus or a bad smell. °? Red streaks leading from the wound. °? A fever. °· You see a  sudden change in the color or texture of the drainage. °· You have signs of dehydration, such as: °? Little or no tears, urine, or sweat. °? Muscle cramps. °? Very dry mouth. °? Headache. °? Dizziness. °Summary °· Negative pressure wound therapy (NPWT) is a device that helps your wound heal. °· Set up a clean station for wound care. Your health care provider will tell you what supplies to use. °· Follow your health care provider's instructions on how to clean your wound and how to change the dressing. °· Contact a health care provider if you have new pain, an irritation, or a rash, or if the alarm goes off and you do not know what to do. °· Get help right away if you have a lot of bleeding, your wound breaks   open, or you have severe pain. Also, get help if you have signs of infection. °This information is not intended to replace advice given to you by your health care provider. Make sure you discuss any questions you have with your health care provider. °Document Revised: 09/05/2018 Document Reviewed: 08/01/2018 °[employer] Patient Education © 2020 [employer] ° °

## 2020-02-16 NOTE — Discharge Summary (Signed)
Physician Discharge Summary  Haley [name] OAC:[id number] DOB: 1985/10/26 DOA: 02/08/2020  PCP: Lesleigh Noe, MD  Admit date: 02/08/2020 Discharge date: 02/16/2020  Time spent: 60 minutes  Recommendations for Outpatient Follow-up:  1. Follow-up with Dr. Johnnye Sima, infectious disease in 1 month. 2. Follow-up with Dr. Nadeen Landau, general surgery in 2 weeks. 3. Follow-up with Lesleigh Noe, MD in 2 weeks.  On follow-up patient's diabetes will need to be reassessed as patient has been started on Lantus and oral hypoglycemic agents discontinued during this hospitalization.  Patient will need a basic metabolic profile, magnesium level to follow-up on electrolytes and renal function.  Patient will need a CBC done to follow-up on H&H.   Discharge Diagnoses:  Principal Problem:   Sepsis ([place]) Active Problems:   Abdominal wall abscess   Cellulitis of right abdominal wall   Type 2 diabetes mellitus without complication, without long-term current use of insulin (HCC)   Hidradenitis suppurativa   Cellulitis   Lactic acid acidosis   Hypoalbuminemia   Hyperglycemia   Iron deficiency anemia due to chronic blood loss   Discharge Condition: Stable and improved  Diet recommendation: Carb modified diet  Filed Weights   02/08/20 1208 02/09/20 1255  Weight: 108.9 kg 108.9 kg    History of present illness:  HPI per Dr. Shawnee Knapp is a 34 y.o. female with DM noncompliant with metformin, h/o hidradenitis suppurativa, bipolar disorder presented to [facility] 02/08/20 with developing worsening boil and swelling on right lower abdomen.   Patient reported that she first noticed a boil in her right lower abdomen almost a week ago.  She used leftover doxycycline 100 mg twice daily for 4 days without any significant improvement in boil that kept growing in size with surrounding redness and swelling. Also tried "boil ease", "draining salve"that she bought through [employer] without any significant  improvement.  Been using OTC lidocaine for pain relief.  She now reported fever chills and generalized malaise.  She denied any history of MRSA infection reported that last skin infection was in her left axilla that was treated with Keflex in July 2021.  No vomiting or diarrhea.   She is currently single without any children.  Smokes 2 cigars daily for past 15 years.  Denies any drinking or recreational drug use.  She currently works at [employer].  Family history significant for mother with DM and CHF.  Dad also has diabetes.  Otherwise unremarkable.   ED Course: 129/82, pulse 131, RR 20, SPO2 99% Temp 100.3 F  Lactic acid 2.4--1.3 Sodium 139, potassium 3.4 Albumin 2.8 WBC 16.7, Hb 10.8 Code PCR negative Urine pregnancy negative Urinalysis significant glycosuria but no evidence of UTI  CT abdomen with 5X 11 X 7 cm right mid to lower anterior abdominal wall phlegmon formation with several associated foci of subcutaneous soft tissue gas.  No drainable organized fluid collection noted.  No intra-abdominal extension.  Possibility of necrotizing fasciitis cannot be ruled out.   Hospital Course:  1 severe sepsis secondary to abdominal wall abscess/cellulitis, POA On admission patient met criteria for severe sepsis with fever, tachycardia, leukocytosis, lactic acid level of 2.4.  Patient initially placed empirically on IV cefepime, IV Flagyl, IV vancomycin, IV Zosyn.  Patient seen in consultation by general surgery patient subsequently underwent incision and drainage of abdominal wall abscess and soft tissue debridement of abdominal wall 10 x 15 cm down to the level of the fascia per general surgery on 02/09/2020 with cultures positive for actinomyces neuii, polymicrobial.  Patient seen in consultation by ID and IV antibiotics have been narrowed down to IV Unasyn.  ID recommended 2 weeks of IV Unasyn and subsequently on to oral Augmentin.  ID recommended repeat abdominal imaging in 1 to 2  months.  Continue current dressing changes as recommended by general surgery.  Wound VAC has been placed and patient will be discharged home with the wound VAC.  PICC line was placed for home IV antibiotics.  Patient will follow up with ID and general surgery in the outpatient setting.   2.  Poorly controlled diabetes mellitus type 2 Hemoglobin A1c 10.8 (02/08/2020).    Patient was placed on Lantus and dose adjusted to 24 units daily.  Patient also placed on NovoLog 6 units 3 times daily for meal coverage as well as sliding scale insulin.  Blood glucose levels were better controlled on this regimen.  Patient's oral hypoglycemic agents were discontinued during the hospitalization.  Patient was seen by the diabetic coordinator.  Patient be discharged home on Lantus 24 units daily.  Outpatient follow-up with PCP.   3.  Iron deficiency anemia/anemia of chronic disease Hemoglobin at 8.5.  No overt bleeding.  Hemoglobin stabilized at 8.1 by day of discharge.  Patient remained asymptomatic.  Outpatient follow-up with PCP.   4.  Hypokalemia/hypomagnesemia Repleted.   Outpatient follow-up.   5.  Chest pain Patient with some complaints of intermittent chest pain.  Questionable anxiety versus musculoskeletal in nature.  EKG done with normal sinus rhythm.  No signs of ischemia noted on EKG. no further chest pain noted during the rest of the hospitalization.  Outpatient follow-up.   6.  Constipation Patient with results after magnesium citrate.  Did not require soapsuds enema.    Patient maintained on a bowel regimen of MiraLAX daily and Senokot-S twice daily.  Patient be discharged on Senokot-S twice daily.  Outpatient follow-up.    7.  Vaginal candidiasis Status post Diflucan 150 mg p.o. x1.    Patient placed on clotrimazole cream nightly will be discharged on 4 more days of clotrimazole cream.  Outpatient follow-up.     Procedures:  CT abdomen and pelvis 02/08/2020  Incision and drainage of  abdominal wall abscess/soft tissue debridement of abdominal wall 10 x 15 cm down to the level of the fascia per Dr. Dema Severin 02/09/2020  PICC line placement 02/15/2020  Consultations:  General surgery: Dr. Dema Severin 02/09/2020  ID: Dr. Johnnye Sima 02/11/2020  Discharge Exam: Vitals:   02/16/20 0559 02/16/20 1000  BP: (!) 93/50 110/60  Pulse: 68   Resp: 15   Temp: 98 F (36.7 C)   SpO2: 91%     General: NAD Cardiovascular: RRR Respiratory: CTAB  Discharge Instructions   Discharge Instructions    [facility] pharmacist to adjust dose for Vancomycin, Aminoglycosides and other anti-infective therapies as requested by physician.   Complete by: As directed    [facility] to provide Cath Flo 39m   Complete by: As directed    Administer for PICC line occlusion and as ordered by physician for other access device issues.   Anaphylaxis Kit: Provided to treat any anaphylactic reaction to the medication being provided to the patient if First Dose or when requested by physician   Complete by: As directed    Epinephrine 162mml vial / amp: Administer 0.70m670m0.70ml71mubcutaneously once for moderate to severe anaphylaxis, nurse to call physician and pharmacy when reaction occurs and call 911 if needed for immediate care   Diphenhydramine 50mg61mIV  vial: Administer 25-65m IV/IM PRN for first dose reaction, rash, itching, mild reaction, nurse to call physician and pharmacy when reaction occurs   Sodium Chloride 0.9% NS 5063mIV: Administer if needed for hypovolemic blood pressure drop or as ordered by physician after call to physician with anaphylactic reaction   Change dressing on IV access line weekly and PRN   Complete by: As directed    Diet - low sodium heart healthy   Complete by: As directed    Diet Carb Modified   Complete by: As directed    Discharge wound care:   Complete by: As directed    As above   Flush IV access with Sodium Chloride 0.9% and Heparin 10 units/ml or  100 units/ml   Complete by: As directed    Home infusion instructions - [facility]   Complete by: As directed    Instructions: Flush IV access with Sodium Chloride 0.9% and Heparin 10units/ml or 100units/ml   Change dressing on IV access line: Weekly and PRN   Instructions Cath Flo 50m37mAdminister for PICC Line occlusion and as ordered by physician for other access device   [facility] pharmacist to adjust dose for: Vancomycin, Aminoglycosides and other anti-infective therapies as requested by physician   Increase activity slowly   Complete by: As directed    Method of administration may be changed at the discretion of home infusion pharmacist based upon assessment of the patient and/or caregiver's ability to self-administer the medication ordered   Complete by: As directed      Allergies as of 02/16/2020   No Known Allergies     Medication List    STOP taking these medications   clindamycin 1 % gel Commonly known as: CLINDAGEL   glipiZIDE 5 MG tablet Commonly known as: GLUCOTROL   metFORMIN 1000 MG tablet Commonly known as: GLUCOPHAGE     TAKE these medications   amoxicillin-clavulanate 875-125 MG tablet Commonly known as: Augmentin Take 1 tablet by mouth 2 (two) times daily. Start taking on: February 25, 2020   ampicillin-sulbactam  IVPB Commonly known as: UNASYN Inject 3 g into the vein every 6 (six) hours for 12 days. Indication:  Abdominal absess First Dose: Yes Last Day of Therapy:  02/24/2020 Labs - Once weekly:  CBC/D and CMP  Method of administration: Mini-Bag Plus / Gravity Method of administration may be changed at the discretion of home infusion pharmacist based upon assessment of the patient and/or caregiver's ability to self-administer the medication ordered.   blood glucose meter kit and supplies Dispense based on patient and insurance preference. Use up to four times daily as directed. (FOR ICD-10 E10.9, E11.9). What changed:  Another medication with the same name was added. Make sure you understand how and when to take each.   blood glucose meter kit and supplies Dispense based on patient and insurance preference. Use up to four times daily as directed. (FOR ICD-10 E10.9, E11.9). What changed: You were already taking a medication with the same name, and this prescription was added. Make sure you understand how and when to take each.   clotrimazole 1 % vaginal cream Commonly known as: GYNE-LOTRIMIN Place 1 Applicatorful vaginally at bedtime for 4 days.   Insulin Pen Needle 32G X 4 MM Misc 24 Units by Does not apply route at bedtime.   Lantus SoloStar 100 UNIT/ML Solostar Pen Generic drug: insulin glargine Inject 24 Units into the skin daily.   naproxen sodium 220 MG tablet Commonly known  as: ALEVE Take 220 mg by mouth daily as needed (pain).   oxyCODONE-acetaminophen 5-325 MG tablet Commonly known as: PERCOCET/ROXICET Take 1-2 tablets by mouth every 4 (four) hours as needed for moderate pain.   polyethylene glycol 17 g packet Commonly known as: MIRALAX / GLYCOLAX Take 17 g by mouth daily as needed.   RA Naproxen Sodium PM 220-25 MG Tabs Generic drug: Naproxen Sod-diphenhydrAMINE Take 1 capsule by mouth at bedtime as needed (sleep).   senna-docusate 8.6-50 MG tablet Commonly known as: Senokot-S Take 1 tablet by mouth 2 (two) times daily.            Discharge Care Instructions  (From admission, onward)         Start     Ordered   02/16/20 0000  Change dressing on IV access line weekly and PRN  (Home infusion instructions - [facility] )        02/16/20 1427   02/16/20 0000  Discharge wound care:       Comments: As above   02/16/20 1524         No Known Allergies  Follow-up Information    Ileana Roup, MD Follow up.   Specialty: General Surgery Why: [facility]All for a follow up appointment in 2 weeks after discharge Contact information: [place]  [address] [phone]        Lesleigh Noe, MD. Schedule an appointment as soon as possible for a visit in 2 week(s).   Specialty: Family Medicine Contact information: [place] [address] [phone]        Campbell Riches, MD. Schedule an appointment as soon as possible for a visit in 1 month(s).   Specialty: Infectious Diseases Contact information: [place] [address] [phone]                The results of significant diagnostics from this hospitalization (including imaging, microbiology, ancillary and laboratory) are listed below for reference.    Significant Diagnostic Studies: CT ABDOMEN PELVIS W CONTRAST  Result Date: 02/08/2020 CLINICAL DATA:  Patient reports abscess to lower abdomen since Monday. Diabetes, area is red and swollen. EXAM: CT ABDOMEN AND PELVIS WITH CONTRAST TECHNIQUE: Multidetector CT imaging of the abdomen and pelvis was performed using the standard protocol following bolus administration of intravenous contrast. CONTRAST:  173m OMNIPAQUE IOHEXOL 300 MG/ML  SOLN COMPARISON:  Ultrasound abdomen 12/29/2012 FINDINGS: Lower chest: Linear atelectasis versus scarring within the left lower lobe. Pulmonary micronodule within the lingula (2:4). Pulmonary micronodule within the right lower lobe (2:9). Otherwise no acute abnormality. Hepatobiliary: Diffusely hypodense hepatic parenchyma compared to the splenic parenchyma suggestive of hepatic steatosis. Layering hyperdensity within the gallbladder lumen suggestive of sludge versus tiny gallstones. No biliary ductal dilatation. Pancreas: Unremarkable. No pancreatic ductal dilatation or surrounding inflammatory changes. Spleen: Normal in size without focal abnormality. A splenule is identified. Adrenals/Urinary Tract: No adrenal nodule bilaterally. Bilateral kidneys enhance symmetrically. No hydronephrosis. No hydroureter. The urinary bladder is unremarkable.  Stomach/Bowel: Stomach is within normal limits. Appendix appears normal. No evidence of bowel wall thickening, distention, or inflammatory changes. Vascular/Lymphatic: No abdominal aorta or iliac aneurysm. No abdominal, pelvic, or inguinal lymphadenopathy. Asymmetrically prominent but nonenlarged inguinal lymph nodes on the right measuring up to 1.1 cm (2:85) that are likely reactive in etiology. Reproductive: Uterus and bilateral adnexa are unremarkable. Other: No intraperitoneal free fluid. No intraperitoneal free gas. No organized fluid collection. Musculoskeletal: Anterior  mid to lower abdominal wall subcutaneous soft tissue edema, right greater than left with no organized fluid collection but a region of soft tissue density representing phlegmon formation measuring up to 5 x 11 x 7 cm (2:65, 5:10). Several superficial foci of subcutaneous soft tissue gas are associated with this area ( 2:59-66). No suspicious lytic or blastic osseous lesions. No acute displaced fracture. IMPRESSION: A 5 x 11 x 7 cm right mid to lower anterior abdominal wall phlegmon formation with several associated foci of subcutaneous soft tissue gas. No drainable organized fluid collection. No intra-abdominal extension. Given findings and patient history, please note that a necrotizing fasciitis is not excluded as this is a clinical diagnosis. No intra-abdominal or intrapelvic abnormality. Electronically Signed   By: Iven Finn M.D.   On: 02/08/2020 16:05   [place] EKG SITE RITE  Result Date: 02/15/2020 If Site Rite image not attached, placement could not be confirmed due to current cardiac rhythm.   Microbiology: Recent Results (from the past 240 hour(s))  SARS Coronavirus 2 by RT PCR (hospital order, performed in [facility] hospital lab) Nasopharyngeal Nasopharyngeal Swab     Status: None   Collection Time: 02/08/20  3:54 PM   Specimen: Nasopharyngeal Swab  Result Value Ref Range Status   SARS Coronavirus 2 NEGATIVE NEGATIVE  Final    Comment: (NOTE) SARS-CoV-2 target nucleic acids are NOT DETECTED.  The SARS-CoV-2 RNA is generally detectable in upper and lower respiratory specimens during the acute phase of infection. The lowest concentration of SARS-CoV-2 viral copies this assay can detect is 250 copies / mL. A negative result does not preclude SARS-CoV-2 infection and should not be used as the sole basis for treatment or other patient management decisions.  A negative result may occur with improper specimen collection / handling, submission of specimen other than nasopharyngeal swab, presence of viral mutation(s) within the areas targeted by this assay, and inadequate number of viral copies (<250 copies / mL). A negative result must be combined with clinical observations, patient history, and epidemiological information.  Fact Sheet for Patients:   StrictlyIdeas.no  Fact Sheet for Healthcare Providers: BankingDealers.co.za  This test is not yet approved or  cleared by the [place] FDA and has been authorized for detection and/or diagnosis of SARS-CoV-2 by FDA under an Emergency Use Authorization (EUA).  This EUA will remain in effect (meaning this test can be used) for the duration of the COVID-19 declaration under Section 564(b)(1) of the Act, 21 U.S.C. section 360bbb-3(b)(1), unless the authorization is terminated or revoked sooner.  Performed at [facility], [address] [place], [place] [address]   Blood culture (routine x 2)     Status: None   Collection Time: 02/08/20  5:45 PM   Specimen: Right Antecubital; Blood  Result Value Ref Range Status   Specimen Description   Final    RIGHT ANTECUBITAL BLOOD Performed at [facility], [place] [address], [place], [place] [address]    Special Requests   Final    BOTTLES DRAWN AEROBIC AND ANAEROBIC Blood Culture adequate volume Performed at [facility], [address] [place], [place] [address]    Culture   Final    NO GROWTH 5 DAYS Performed at [facility], [place] [address], [place], [place] [address]    Report Status 02/13/2020 FINAL  Final  Aerobic Culture (superficial specimen)     Status: Abnormal   Collection Time: 02/09/20  9:44  AM   Specimen: Abdomen; Wound  Result Value Ref Range Status   Specimen Description   Final    ABDOMEN Performed at [place] [address], [place], [place] [address]    Special Requests   Final    Normal Performed at [facility], [place] [address], [place], [place] [address]    Gram Stain   Final    RARE WBC PRESENT, PREDOMINANTLY PMN ABUNDANT GRAM POSITIVE COCCI FEW GRAM NEGATIVE RODS    Culture (A)  Final    MULTIPLE ORGANISMS PRESENT, NONE PREDOMINANT NO STAPHYLOCOCCUS AUREUS ISOLATED NO GROUP A STREP (S.PYOGENES) ISOLATED Performed at [facility], [address], [place], [place] [address]    Report Status 02/11/2020 FINAL  Final  Aerobic/Anaerobic Culture (surgical/deep wound)     Status: None   Collection Time: 02/09/20  2:22 PM   Specimen: Abscess  Result Value Ref Range Status   Specimen Description   Final    ABSCESS ABDOMEN Performed at [place] [address], [place], [place] [address]    Special Requests   Final    NONE Performed at [facility], [place] [address] [place], [place] [address]    Gram Stain   Final    ABUNDANT WBC PRESENT, PREDOMINANTLY PMN ABUNDANT GRAM POSITIVE COCCI MODERATE GRAM VARIABLE ROD FEW GRAM NEGATIVE COCCI Performed at [facility], [place] [address] [place], [place] [address]    Culture   Final    FEW ACTINOMYCES NEUII Standardized susceptibility testing for this organism is not available. MIXED ANAEROBIC FLORA PRESENT.  CALL LAB IF FURTHER IID REQUIRED.    Report Status 02/11/2020 FINAL  Final     Labs: Basic Metabolic Panel: Recent Labs  Lab  02/12/20 0547 02/13/20 0628 02/14/20 0645 02/15/20 0548 02/16/20 0507  NA 138 139 140 138 139  K 3.4* 3.4* 4.1 3.8 3.8  CL 108 107 109 103 104  CO2 '23 24 23 26 27  ' GLUCOSE 126* 146* 138* 134* 146*  BUN 7 <5* <5* <5* 5*  CREATININE 0.42* 0.34* 0.38* 0.40* 0.36*  CALCIUM 8.1* 8.1* 8.4* 8.4* 8.4*  MG  --  1.6* 1.8 1.7 1.9   Liver Function Tests: Recent Labs  Lab 02/10/20 0538 02/11/20 0553  AST 22 18  ALT 19 15  ALKPHOS 93 66  BILITOT 1.5* 0.9  PROT 7.6 6.5  ALBUMIN 2.6* 2.1*   No results for input(s): LIPASE, AMYLASE in the last 168 hours. No results for input(s): AMMONIA in the last 168 hours. CBC: Recent Labs  Lab 02/11/20 0553 02/11/20 0553 02/12/20 0547 02/13/20 0628 02/14/20 0645 02/15/20 0548 02/16/20 0507  WBC 12.7*   < > 9.7 8.8 9.0 8.9 7.9  NEUTROABS 9.4*  --   --  5.3  --   --  4.0  HGB 9.0*   < > 9.2* 8.8* 8.9* 8.5* 8.1*  HCT 27.8*   < > 28.6* 27.1* 26.9* 26.9* 25.8*  MCV 82.5   < > 82.7 82.9 81.5 85.4 86.0  PLT 280   < > 301 291 327 329 322   < > = values in this interval not displayed.   Cardiac Enzymes: No results for input(s): CKTOTAL, CKMB, CKMBINDEX, TROPONINI in the last 168 hours. BNP: BNP (last 3 results) No results for input(s): BNP in the last 8760 hours.  ProBNP (last 3 results) No results for input(s): PROBNP in the last 8760 hours.  CBG: Recent Labs  Lab 02/15/20 1144 02/15/20 1630 02/15/20 2212 02/16/20 0750 02/16/20 1159  GLUCAP 142* 157* 197* 140* 144*       Signed:  Irine Seal MD.  [facility] 02/16/2020, 3:54 PM

## 2020-02-17 ENCOUNTER — Telehealth: Payer: Self-pay | Admitting: *Deleted

## 2020-02-17 DIAGNOSIS — E872 Acidosis: Secondary | ICD-10-CM | POA: Diagnosis not present

## 2020-02-17 DIAGNOSIS — R000 Tachycardia, unspecified: Secondary | ICD-10-CM | POA: Diagnosis not present

## 2020-02-17 DIAGNOSIS — K5900 Constipation, unspecified: Secondary | ICD-10-CM | POA: Diagnosis not present

## 2020-02-17 DIAGNOSIS — L02211 Cutaneous abscess of abdominal wall: Secondary | ICD-10-CM | POA: Diagnosis not present

## 2020-02-17 DIAGNOSIS — E119 Type 2 diabetes mellitus without complications: Secondary | ICD-10-CM | POA: Diagnosis not present

## 2020-02-17 DIAGNOSIS — A419 Sepsis, unspecified organism: Secondary | ICD-10-CM | POA: Diagnosis not present

## 2020-02-17 DIAGNOSIS — E876 Hypokalemia: Secondary | ICD-10-CM | POA: Diagnosis not present

## 2020-02-17 DIAGNOSIS — L732 Hidradenitis suppurativa: Secondary | ICD-10-CM | POA: Diagnosis not present

## 2020-02-17 DIAGNOSIS — B9689 Other specified bacterial agents as the cause of diseases classified elsewhere: Secondary | ICD-10-CM | POA: Diagnosis not present

## 2020-02-17 DIAGNOSIS — D500 Iron deficiency anemia secondary to blood loss (chronic): Secondary | ICD-10-CM | POA: Diagnosis not present

## 2020-02-17 DIAGNOSIS — L03311 Cellulitis of abdominal wall: Secondary | ICD-10-CM | POA: Diagnosis not present

## 2020-02-17 DIAGNOSIS — R911 Solitary pulmonary nodule: Secondary | ICD-10-CM | POA: Diagnosis not present

## 2020-02-17 DIAGNOSIS — D72829 Elevated white blood cell count, unspecified: Secondary | ICD-10-CM | POA: Diagnosis not present

## 2020-02-17 DIAGNOSIS — F319 Bipolar disorder, unspecified: Secondary | ICD-10-CM | POA: Diagnosis not present

## 2020-02-17 NOTE — Telephone Encounter (Signed)
Ok for home health orders at requested.   I'm willing to complete paperwork for other providers to help with HH orders.  Routing to MA to follow up with [facility] and to front office staff to have patient schedule hospital follow-up.

## 2020-02-17 NOTE — Telephone Encounter (Signed)
Diane nurse with [facility] called stating that patient was in the hospital with a wound and had surgery. Diane stated that patient was sent home with IV antibiotics. Diane stated that she will be getting the wound care orders from Infectious disease and Dr. Cliffton Asters the surgeon. Diane stated that she needs to know if Dr. Selena Batten will sign a 485 form for her to get orders from the other doctors? Diane stated that patient has not gotten her insulin or her meter yet, but is going to pick it up today.  Diane stated that she would like orders from Dr. Selena Batten for diabetic education 3 times a week. Diane requested a call back.

## 2020-02-17 NOTE — Telephone Encounter (Signed)
Spoke to Enis Slipper [facility] nurse, and gave Dr. Elmyra Ricks verbal orders for diabetic education 3 times a week.

## 2020-02-19 DIAGNOSIS — B9689 Other specified bacterial agents as the cause of diseases classified elsewhere: Secondary | ICD-10-CM | POA: Diagnosis not present

## 2020-02-19 DIAGNOSIS — E119 Type 2 diabetes mellitus without complications: Secondary | ICD-10-CM | POA: Diagnosis not present

## 2020-02-19 DIAGNOSIS — L03311 Cellulitis of abdominal wall: Secondary | ICD-10-CM | POA: Diagnosis not present

## 2020-02-19 DIAGNOSIS — L732 Hidradenitis suppurativa: Secondary | ICD-10-CM | POA: Diagnosis not present

## 2020-02-19 DIAGNOSIS — D72829 Elevated white blood cell count, unspecified: Secondary | ICD-10-CM | POA: Diagnosis not present

## 2020-02-19 DIAGNOSIS — E876 Hypokalemia: Secondary | ICD-10-CM | POA: Diagnosis not present

## 2020-02-19 DIAGNOSIS — K5900 Constipation, unspecified: Secondary | ICD-10-CM | POA: Diagnosis not present

## 2020-02-19 DIAGNOSIS — A419 Sepsis, unspecified organism: Secondary | ICD-10-CM | POA: Diagnosis not present

## 2020-02-19 DIAGNOSIS — D500 Iron deficiency anemia secondary to blood loss (chronic): Secondary | ICD-10-CM | POA: Diagnosis not present

## 2020-02-19 DIAGNOSIS — F319 Bipolar disorder, unspecified: Secondary | ICD-10-CM | POA: Diagnosis not present

## 2020-02-19 DIAGNOSIS — L02211 Cutaneous abscess of abdominal wall: Secondary | ICD-10-CM | POA: Diagnosis not present

## 2020-02-19 DIAGNOSIS — E872 Acidosis: Secondary | ICD-10-CM | POA: Diagnosis not present

## 2020-02-19 DIAGNOSIS — R000 Tachycardia, unspecified: Secondary | ICD-10-CM | POA: Diagnosis not present

## 2020-02-19 DIAGNOSIS — R911 Solitary pulmonary nodule: Secondary | ICD-10-CM | POA: Diagnosis not present

## 2020-02-20 DIAGNOSIS — A419 Sepsis, unspecified organism: Secondary | ICD-10-CM | POA: Diagnosis not present

## 2020-02-22 DIAGNOSIS — L732 Hidradenitis suppurativa: Secondary | ICD-10-CM | POA: Diagnosis not present

## 2020-02-22 DIAGNOSIS — E119 Type 2 diabetes mellitus without complications: Secondary | ICD-10-CM | POA: Diagnosis not present

## 2020-02-22 DIAGNOSIS — F319 Bipolar disorder, unspecified: Secondary | ICD-10-CM | POA: Diagnosis not present

## 2020-02-22 DIAGNOSIS — L03311 Cellulitis of abdominal wall: Secondary | ICD-10-CM | POA: Diagnosis not present

## 2020-02-22 DIAGNOSIS — L02211 Cutaneous abscess of abdominal wall: Secondary | ICD-10-CM | POA: Diagnosis not present

## 2020-02-22 DIAGNOSIS — A419 Sepsis, unspecified organism: Secondary | ICD-10-CM | POA: Diagnosis not present

## 2020-02-22 DIAGNOSIS — B9689 Other specified bacterial agents as the cause of diseases classified elsewhere: Secondary | ICD-10-CM | POA: Diagnosis not present

## 2020-02-22 DIAGNOSIS — K5900 Constipation, unspecified: Secondary | ICD-10-CM | POA: Diagnosis not present

## 2020-02-22 DIAGNOSIS — D72829 Elevated white blood cell count, unspecified: Secondary | ICD-10-CM | POA: Diagnosis not present

## 2020-02-22 DIAGNOSIS — E872 Acidosis: Secondary | ICD-10-CM | POA: Diagnosis not present

## 2020-02-22 DIAGNOSIS — D500 Iron deficiency anemia secondary to blood loss (chronic): Secondary | ICD-10-CM | POA: Diagnosis not present

## 2020-02-22 DIAGNOSIS — E876 Hypokalemia: Secondary | ICD-10-CM | POA: Diagnosis not present

## 2020-02-22 DIAGNOSIS — R911 Solitary pulmonary nodule: Secondary | ICD-10-CM | POA: Diagnosis not present

## 2020-02-22 DIAGNOSIS — R000 Tachycardia, unspecified: Secondary | ICD-10-CM | POA: Diagnosis not present

## 2020-02-22 DIAGNOSIS — Z792 Long term (current) use of antibiotics: Secondary | ICD-10-CM | POA: Diagnosis not present

## 2020-02-24 DIAGNOSIS — K5900 Constipation, unspecified: Secondary | ICD-10-CM | POA: Diagnosis not present

## 2020-02-24 DIAGNOSIS — E876 Hypokalemia: Secondary | ICD-10-CM | POA: Diagnosis not present

## 2020-02-24 DIAGNOSIS — L02211 Cutaneous abscess of abdominal wall: Secondary | ICD-10-CM | POA: Diagnosis not present

## 2020-02-24 DIAGNOSIS — R911 Solitary pulmonary nodule: Secondary | ICD-10-CM | POA: Diagnosis not present

## 2020-02-24 DIAGNOSIS — A419 Sepsis, unspecified organism: Secondary | ICD-10-CM | POA: Diagnosis not present

## 2020-02-24 DIAGNOSIS — L03311 Cellulitis of abdominal wall: Secondary | ICD-10-CM | POA: Diagnosis not present

## 2020-02-24 DIAGNOSIS — D72829 Elevated white blood cell count, unspecified: Secondary | ICD-10-CM | POA: Diagnosis not present

## 2020-02-24 DIAGNOSIS — E119 Type 2 diabetes mellitus without complications: Secondary | ICD-10-CM | POA: Diagnosis not present

## 2020-02-24 DIAGNOSIS — D500 Iron deficiency anemia secondary to blood loss (chronic): Secondary | ICD-10-CM | POA: Diagnosis not present

## 2020-02-24 DIAGNOSIS — B9689 Other specified bacterial agents as the cause of diseases classified elsewhere: Secondary | ICD-10-CM | POA: Diagnosis not present

## 2020-02-24 DIAGNOSIS — E872 Acidosis: Secondary | ICD-10-CM | POA: Diagnosis not present

## 2020-02-24 DIAGNOSIS — F319 Bipolar disorder, unspecified: Secondary | ICD-10-CM | POA: Diagnosis not present

## 2020-02-24 DIAGNOSIS — L732 Hidradenitis suppurativa: Secondary | ICD-10-CM | POA: Diagnosis not present

## 2020-02-24 DIAGNOSIS — R000 Tachycardia, unspecified: Secondary | ICD-10-CM | POA: Diagnosis not present

## 2020-02-25 ENCOUNTER — Ambulatory Visit (INDEPENDENT_AMBULATORY_CARE_PROVIDER_SITE_OTHER): Payer: BC Managed Care – PPO | Admitting: Family Medicine

## 2020-02-25 ENCOUNTER — Other Ambulatory Visit: Payer: BC Managed Care – PPO

## 2020-02-25 ENCOUNTER — Other Ambulatory Visit: Payer: Self-pay

## 2020-02-25 ENCOUNTER — Encounter: Payer: Self-pay | Admitting: Family Medicine

## 2020-02-25 VITALS — BP 114/60 | HR 74 | Temp 98.0°F | Wt 240.0 lb

## 2020-02-25 DIAGNOSIS — B9689 Other specified bacterial agents as the cause of diseases classified elsewhere: Secondary | ICD-10-CM | POA: Diagnosis not present

## 2020-02-25 DIAGNOSIS — Z792 Long term (current) use of antibiotics: Secondary | ICD-10-CM

## 2020-02-25 DIAGNOSIS — E876 Hypokalemia: Secondary | ICD-10-CM | POA: Diagnosis not present

## 2020-02-25 DIAGNOSIS — E119 Type 2 diabetes mellitus without complications: Secondary | ICD-10-CM | POA: Diagnosis not present

## 2020-02-25 DIAGNOSIS — Z6839 Body mass index (BMI) 39.0-39.9, adult: Secondary | ICD-10-CM

## 2020-02-25 DIAGNOSIS — L02211 Cutaneous abscess of abdominal wall: Secondary | ICD-10-CM

## 2020-02-25 DIAGNOSIS — Z794 Long term (current) use of insulin: Secondary | ICD-10-CM

## 2020-02-25 DIAGNOSIS — Z9181 History of falling: Secondary | ICD-10-CM

## 2020-02-25 DIAGNOSIS — L732 Hidradenitis suppurativa: Secondary | ICD-10-CM

## 2020-02-25 DIAGNOSIS — E872 Acidosis: Secondary | ICD-10-CM

## 2020-02-25 DIAGNOSIS — F17210 Nicotine dependence, cigarettes, uncomplicated: Secondary | ICD-10-CM

## 2020-02-25 DIAGNOSIS — D72829 Elevated white blood cell count, unspecified: Secondary | ICD-10-CM

## 2020-02-25 DIAGNOSIS — B373 Candidiasis of vulva and vagina: Secondary | ICD-10-CM

## 2020-02-25 DIAGNOSIS — K5900 Constipation, unspecified: Secondary | ICD-10-CM

## 2020-02-25 DIAGNOSIS — A419 Sepsis, unspecified organism: Secondary | ICD-10-CM | POA: Diagnosis not present

## 2020-02-25 DIAGNOSIS — D500 Iron deficiency anemia secondary to blood loss (chronic): Secondary | ICD-10-CM

## 2020-02-25 DIAGNOSIS — L03311 Cellulitis of abdominal wall: Secondary | ICD-10-CM | POA: Diagnosis not present

## 2020-02-25 DIAGNOSIS — F319 Bipolar disorder, unspecified: Secondary | ICD-10-CM

## 2020-02-25 DIAGNOSIS — Z452 Encounter for adjustment and management of vascular access device: Secondary | ICD-10-CM

## 2020-02-25 DIAGNOSIS — R911 Solitary pulmonary nodule: Secondary | ICD-10-CM

## 2020-02-25 DIAGNOSIS — R000 Tachycardia, unspecified: Secondary | ICD-10-CM

## 2020-02-25 LAB — COMPREHENSIVE METABOLIC PANEL
ALT: 21 U/L (ref 0–35)
AST: 21 U/L (ref 0–37)
Albumin: 3.5 g/dL (ref 3.5–5.2)
Alkaline Phosphatase: 54 U/L (ref 39–117)
BUN: 7 mg/dL (ref 6–23)
CO2: 23 mEq/L (ref 19–32)
Calcium: 8.9 mg/dL (ref 8.4–10.5)
Chloride: 105 mEq/L (ref 96–112)
Creatinine, Ser: 0.46 mg/dL (ref 0.40–1.20)
GFR: 185.25 mL/min (ref >60.00)
Glucose, Bld: 95 mg/dL (ref 70–99)
Potassium: 3.6 mEq/L (ref 3.5–5.1)
Sodium: 137 mEq/L (ref 135–145)
Total Bilirubin: 0.5 mg/dL (ref 0.2–1.2)
Total Protein: 7.4 g/dL (ref 6.0–8.3)

## 2020-02-25 LAB — CBC
HCT: 31.5 % — ABNORMAL LOW (ref 36.0–46.0)
Hemoglobin: 9.9 g/dL — ABNORMAL LOW (ref 12.0–15.0)
MCHC: 31.2 g/dL (ref 30.0–36.0)
MCV: 85.2 fl (ref 78.0–100.0)
Platelets: 285 10*3/uL (ref 150.0–400.0)
RBC: 3.7 Mil/uL — ABNORMAL LOW (ref 3.87–5.11)
RDW: 15.2 % (ref 11.5–15.5)
WBC: 7.3 10*3/uL (ref 4.0–10.5)

## 2020-02-25 NOTE — Assessment & Plan Note (Signed)
Improving. Wound Vac in placed. She gets three times weekly dressing changes and has surgery f/u in ~2 weeks. Discussed return to work today with need for dressing changes anticipate she could return with time off for changes but will also defer to surgery's guidance. She will follow-up after calling the surgeon. Dr. Cliffton Asters

## 2020-02-25 NOTE — Assessment & Plan Note (Signed)
Pt with recurrent boils and now with an abdominal abscess. She has ID follow-up but also discussed dermatology may be helpful. Referral placed in the event that additional support for preventative and management is needed.

## 2020-02-25 NOTE — Assessment & Plan Note (Signed)
Hgb low in the hospital. Denies heavy menses currently. Will check for stability.

## 2020-02-25 NOTE — Progress Notes (Signed)
Subjective:     Haley [name] is a 34 y.o. 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 ID consult - Unasyn then augmentin with need for f/u imaging. PICC line with home Abx. Diabetes - Lantus 24 units and novolog 6 units with meals (metformin stopped). Anemia - no active bleeding, stable at discharge.   Social History   Tobacco Use  Smoking Status Former Smoker  . Years: 15.00  . Types: Cigars  . Quit date: 02/03/2020  . Years since quitting: 0.0  Smokeless Tobacco Never Used  Tobacco Comment   1-2 cigars per day        Objective:    BP Readings from Last 3 Encounters:  02/25/20 114/60  02/16/20 110/60  12/05/19 110/69   Wt Readings from Last 3 Encounters:  02/25/20 240 lb (108.9 kg)  02/09/20 240 lb (108.9 kg)  12/04/19 239 lb (108.4 kg)    BP 114/60   Pulse 74   Temp 98 F (36.7 C) (Temporal)   Wt 240 lb (108.9 kg)   LMP 02/24/2020 (Exact Date)   SpO2 98%   BMI 39.94 kg/m    Physical Exam Constitutional:      General: She is not in acute distress.    Appearance: She is well-developed. She is not diaphoretic.  HENT:     Right Ear: External ear normal.     Left Ear: External ear normal.  Eyes:     Conjunctiva/sclera: Conjunctivae normal.  Cardiovascular:     Rate and Rhythm: Normal rate and regular rhythm.     Heart sounds: No murmur heard.   Pulmonary:     Effort: Pulmonary effort is normal. No respiratory distress.     Breath  sounds: Normal breath sounds. No wheezing.  Abdominal:     Comments: Wound vac in place, draining white/clear fluid. No surrounding erythema. No TTP to palpation  Musculoskeletal:     Cervical back: Neck supple.  Skin:    General: Skin is warm and dry.     Capillary Refill: Capillary refill takes less than 2 seconds.  Neurological:     Mental Status: She is alert. Mental status is at baseline.  Psychiatric:        Mood and Affect: Mood normal.        Behavior: Behavior normal.           Assessment & Plan:   Problem List Items Addressed This Visit      Endocrine   Type 2 diabetes mellitus without complication, without long-term current use of insulin (HCC)    Improved controlled based on home monitoring. Cont lantus 24 units and metformin. Return in 3 months for recheck. Discussed controlling diabetes as one prevention step for future infections.       Relevant Medications   metFORMIN (GLUCOPHAGE) 1000 MG tablet     Musculoskeletal and Integument   Hidradenitis suppurativa  Pt with recurrent boils and now with an abdominal abscess. She has ID follow-up but also discussed dermatology may be helpful. Referral placed in the event that additional support for preventative and management is needed.       Relevant Orders   Ambulatory referral to Dermatology     Other   Iron deficiency anemia due to chronic blood loss - Primary    Hgb low in the hospital. Denies heavy menses currently. Will check for stability.       Relevant Orders   CBC   Abdominal wall abscess    Improving. Wound Vac in placed. She gets three times weekly dressing changes and has surgery f/u in ~2 weeks. Discussed return to work today with need for dressing changes anticipate she could return with time off for changes but will also defer to surgery's guidance. She will follow-up after calling the surgeon. Dr. Cliffton Asters       Other Visit Diagnoses    Hypomagnesemia       Relevant Orders   Comprehensive  metabolic panel   Magnesium   Hypokalemia       Relevant Orders   Comprehensive metabolic panel       Return in about 3 months (around 05/26/2020) for diabetes and check in.  Lynnda Child, MD  This visit occurred during the SARS-CoV-2 public health emergency.  Safety protocols were in place, including screening questions prior to the visit, additional usage of staff PPE, and extensive cleaning of exam room while observing appropriate contact time as indicated for disinfecting solutions.

## 2020-02-25 NOTE — Assessment & Plan Note (Signed)
Improved controlled based on home monitoring. Cont lantus 24 units and metformin. Return in 3 months for recheck. Discussed controlling diabetes as one prevention step for future infections.

## 2020-02-25 NOTE — Patient Instructions (Signed)
Continue metformin and lantus Call if you are noticing Blood sugar >180   Call the surgeon's office to see if they will help with FMLA and if not, to see if they have an idea of how long you will need the wound vac.   If they cannot help with FMLA - send the paperwork to me  OK to travel and return to work if you are feeling up for it.

## 2020-02-26 DIAGNOSIS — L03311 Cellulitis of abdominal wall: Secondary | ICD-10-CM | POA: Diagnosis not present

## 2020-02-26 DIAGNOSIS — K5900 Constipation, unspecified: Secondary | ICD-10-CM | POA: Diagnosis not present

## 2020-02-26 DIAGNOSIS — A419 Sepsis, unspecified organism: Secondary | ICD-10-CM | POA: Diagnosis not present

## 2020-02-26 DIAGNOSIS — D72829 Elevated white blood cell count, unspecified: Secondary | ICD-10-CM | POA: Diagnosis not present

## 2020-02-26 DIAGNOSIS — E876 Hypokalemia: Secondary | ICD-10-CM | POA: Diagnosis not present

## 2020-02-26 DIAGNOSIS — R000 Tachycardia, unspecified: Secondary | ICD-10-CM | POA: Diagnosis not present

## 2020-02-26 DIAGNOSIS — L02211 Cutaneous abscess of abdominal wall: Secondary | ICD-10-CM | POA: Diagnosis not present

## 2020-02-26 DIAGNOSIS — F319 Bipolar disorder, unspecified: Secondary | ICD-10-CM | POA: Diagnosis not present

## 2020-02-26 DIAGNOSIS — E872 Acidosis: Secondary | ICD-10-CM | POA: Diagnosis not present

## 2020-02-26 DIAGNOSIS — L732 Hidradenitis suppurativa: Secondary | ICD-10-CM | POA: Diagnosis not present

## 2020-02-26 DIAGNOSIS — D500 Iron deficiency anemia secondary to blood loss (chronic): Secondary | ICD-10-CM | POA: Diagnosis not present

## 2020-02-26 DIAGNOSIS — B9689 Other specified bacterial agents as the cause of diseases classified elsewhere: Secondary | ICD-10-CM | POA: Diagnosis not present

## 2020-02-26 DIAGNOSIS — E119 Type 2 diabetes mellitus without complications: Secondary | ICD-10-CM | POA: Diagnosis not present

## 2020-02-26 DIAGNOSIS — R911 Solitary pulmonary nodule: Secondary | ICD-10-CM | POA: Diagnosis not present

## 2020-02-26 LAB — MAGNESIUM: Magnesium: 1.5 mg/dL (ref 1.5–2.5)

## 2020-02-29 ENCOUNTER — Telehealth: Payer: Self-pay

## 2020-02-29 DIAGNOSIS — R000 Tachycardia, unspecified: Secondary | ICD-10-CM | POA: Diagnosis not present

## 2020-02-29 DIAGNOSIS — L02211 Cutaneous abscess of abdominal wall: Secondary | ICD-10-CM | POA: Diagnosis not present

## 2020-02-29 DIAGNOSIS — E872 Acidosis: Secondary | ICD-10-CM | POA: Diagnosis not present

## 2020-02-29 DIAGNOSIS — D500 Iron deficiency anemia secondary to blood loss (chronic): Secondary | ICD-10-CM | POA: Diagnosis not present

## 2020-02-29 DIAGNOSIS — E876 Hypokalemia: Secondary | ICD-10-CM | POA: Diagnosis not present

## 2020-02-29 DIAGNOSIS — A419 Sepsis, unspecified organism: Secondary | ICD-10-CM | POA: Diagnosis not present

## 2020-02-29 DIAGNOSIS — F319 Bipolar disorder, unspecified: Secondary | ICD-10-CM | POA: Diagnosis not present

## 2020-02-29 DIAGNOSIS — B372 Candidiasis of skin and nail: Secondary | ICD-10-CM

## 2020-02-29 DIAGNOSIS — R911 Solitary pulmonary nodule: Secondary | ICD-10-CM | POA: Diagnosis not present

## 2020-02-29 DIAGNOSIS — L03311 Cellulitis of abdominal wall: Secondary | ICD-10-CM | POA: Diagnosis not present

## 2020-02-29 DIAGNOSIS — E119 Type 2 diabetes mellitus without complications: Secondary | ICD-10-CM | POA: Diagnosis not present

## 2020-02-29 DIAGNOSIS — L732 Hidradenitis suppurativa: Secondary | ICD-10-CM | POA: Diagnosis not present

## 2020-02-29 DIAGNOSIS — D72829 Elevated white blood cell count, unspecified: Secondary | ICD-10-CM | POA: Diagnosis not present

## 2020-02-29 DIAGNOSIS — K5900 Constipation, unspecified: Secondary | ICD-10-CM | POA: Diagnosis not present

## 2020-02-29 DIAGNOSIS — B9689 Other specified bacterial agents as the cause of diseases classified elsewhere: Secondary | ICD-10-CM | POA: Diagnosis not present

## 2020-02-29 MED ORDER — NYSTATIN 100000 UNIT/GM EX POWD: 1.0000 "application " | Freq: Three times a day (TID) | CUTANEOUS | 0 refills | Status: DC

## 2020-02-29 NOTE — Telephone Encounter (Signed)
This sounds like it could be a yeast infection.   But - since this is a new medication and infectious disease is managing I would recommend that she call her infectious disease doctor for advice. I did send in a powder she can use on the skin but I still want her to check in with them to make sure she should continue the medication.   If no improvement in 1-2 days make an office visit.   If worsening (or shortness of breath, nausea/vomiting) - ER or urgent care today

## 2020-02-29 NOTE — Telephone Encounter (Signed)
Spoke to pt to relay lab results and she told me she started the Augmentin but has developed a rash "in all her creases". Please advise.

## 2020-03-02 DIAGNOSIS — E872 Acidosis: Secondary | ICD-10-CM | POA: Diagnosis not present

## 2020-03-02 DIAGNOSIS — B9689 Other specified bacterial agents as the cause of diseases classified elsewhere: Secondary | ICD-10-CM | POA: Diagnosis not present

## 2020-03-02 DIAGNOSIS — K5900 Constipation, unspecified: Secondary | ICD-10-CM | POA: Diagnosis not present

## 2020-03-02 DIAGNOSIS — E119 Type 2 diabetes mellitus without complications: Secondary | ICD-10-CM | POA: Diagnosis not present

## 2020-03-02 DIAGNOSIS — R000 Tachycardia, unspecified: Secondary | ICD-10-CM | POA: Diagnosis not present

## 2020-03-02 DIAGNOSIS — E876 Hypokalemia: Secondary | ICD-10-CM | POA: Diagnosis not present

## 2020-03-02 DIAGNOSIS — A419 Sepsis, unspecified organism: Secondary | ICD-10-CM | POA: Diagnosis not present

## 2020-03-02 DIAGNOSIS — L03311 Cellulitis of abdominal wall: Secondary | ICD-10-CM | POA: Diagnosis not present

## 2020-03-02 DIAGNOSIS — F319 Bipolar disorder, unspecified: Secondary | ICD-10-CM | POA: Diagnosis not present

## 2020-03-02 DIAGNOSIS — L02211 Cutaneous abscess of abdominal wall: Secondary | ICD-10-CM | POA: Diagnosis not present

## 2020-03-02 DIAGNOSIS — L732 Hidradenitis suppurativa: Secondary | ICD-10-CM | POA: Diagnosis not present

## 2020-03-02 DIAGNOSIS — R911 Solitary pulmonary nodule: Secondary | ICD-10-CM | POA: Diagnosis not present

## 2020-03-02 DIAGNOSIS — D72829 Elevated white blood cell count, unspecified: Secondary | ICD-10-CM | POA: Diagnosis not present

## 2020-03-02 DIAGNOSIS — D500 Iron deficiency anemia secondary to blood loss (chronic): Secondary | ICD-10-CM | POA: Diagnosis not present

## 2020-03-02 NOTE — Telephone Encounter (Signed)
Spoke to pt and she has already picked up powder that was called in. Relayed suggestion from Dr. Selena Batten to call ID and see if they have any other suggestions and to also call us back on Friday of this week to give [place] an update and see if we need to make a follow-up appt. Pt stated understanding.

## 2020-03-04 DIAGNOSIS — R000 Tachycardia, unspecified: Secondary | ICD-10-CM | POA: Diagnosis not present

## 2020-03-04 DIAGNOSIS — R911 Solitary pulmonary nodule: Secondary | ICD-10-CM | POA: Diagnosis not present

## 2020-03-04 DIAGNOSIS — F319 Bipolar disorder, unspecified: Secondary | ICD-10-CM | POA: Diagnosis not present

## 2020-03-04 DIAGNOSIS — L732 Hidradenitis suppurativa: Secondary | ICD-10-CM | POA: Diagnosis not present

## 2020-03-04 DIAGNOSIS — E876 Hypokalemia: Secondary | ICD-10-CM | POA: Diagnosis not present

## 2020-03-04 DIAGNOSIS — E119 Type 2 diabetes mellitus without complications: Secondary | ICD-10-CM | POA: Diagnosis not present

## 2020-03-04 DIAGNOSIS — D72829 Elevated white blood cell count, unspecified: Secondary | ICD-10-CM | POA: Diagnosis not present

## 2020-03-04 DIAGNOSIS — B9689 Other specified bacterial agents as the cause of diseases classified elsewhere: Secondary | ICD-10-CM | POA: Diagnosis not present

## 2020-03-04 DIAGNOSIS — L02211 Cutaneous abscess of abdominal wall: Secondary | ICD-10-CM | POA: Diagnosis not present

## 2020-03-04 DIAGNOSIS — E872 Acidosis: Secondary | ICD-10-CM | POA: Diagnosis not present

## 2020-03-04 DIAGNOSIS — D500 Iron deficiency anemia secondary to blood loss (chronic): Secondary | ICD-10-CM | POA: Diagnosis not present

## 2020-03-04 DIAGNOSIS — L03311 Cellulitis of abdominal wall: Secondary | ICD-10-CM | POA: Diagnosis not present

## 2020-03-04 DIAGNOSIS — A419 Sepsis, unspecified organism: Secondary | ICD-10-CM | POA: Diagnosis not present

## 2020-03-04 DIAGNOSIS — K5900 Constipation, unspecified: Secondary | ICD-10-CM | POA: Diagnosis not present

## 2020-03-07 DIAGNOSIS — A419 Sepsis, unspecified organism: Secondary | ICD-10-CM | POA: Diagnosis not present

## 2020-03-07 DIAGNOSIS — D72829 Elevated white blood cell count, unspecified: Secondary | ICD-10-CM | POA: Diagnosis not present

## 2020-03-07 DIAGNOSIS — D500 Iron deficiency anemia secondary to blood loss (chronic): Secondary | ICD-10-CM | POA: Diagnosis not present

## 2020-03-07 DIAGNOSIS — E872 Acidosis: Secondary | ICD-10-CM | POA: Diagnosis not present

## 2020-03-07 DIAGNOSIS — F319 Bipolar disorder, unspecified: Secondary | ICD-10-CM | POA: Diagnosis not present

## 2020-03-07 DIAGNOSIS — L732 Hidradenitis suppurativa: Secondary | ICD-10-CM | POA: Diagnosis not present

## 2020-03-07 DIAGNOSIS — E119 Type 2 diabetes mellitus without complications: Secondary | ICD-10-CM | POA: Diagnosis not present

## 2020-03-07 DIAGNOSIS — R911 Solitary pulmonary nodule: Secondary | ICD-10-CM | POA: Diagnosis not present

## 2020-03-07 DIAGNOSIS — L02211 Cutaneous abscess of abdominal wall: Secondary | ICD-10-CM | POA: Diagnosis not present

## 2020-03-07 DIAGNOSIS — B9689 Other specified bacterial agents as the cause of diseases classified elsewhere: Secondary | ICD-10-CM | POA: Diagnosis not present

## 2020-03-07 DIAGNOSIS — K5900 Constipation, unspecified: Secondary | ICD-10-CM | POA: Diagnosis not present

## 2020-03-07 DIAGNOSIS — R000 Tachycardia, unspecified: Secondary | ICD-10-CM | POA: Diagnosis not present

## 2020-03-07 DIAGNOSIS — L03311 Cellulitis of abdominal wall: Secondary | ICD-10-CM | POA: Diagnosis not present

## 2020-03-07 DIAGNOSIS — E876 Hypokalemia: Secondary | ICD-10-CM | POA: Diagnosis not present

## 2020-03-09 DIAGNOSIS — D500 Iron deficiency anemia secondary to blood loss (chronic): Secondary | ICD-10-CM | POA: Diagnosis not present

## 2020-03-09 DIAGNOSIS — E872 Acidosis: Secondary | ICD-10-CM | POA: Diagnosis not present

## 2020-03-09 DIAGNOSIS — E119 Type 2 diabetes mellitus without complications: Secondary | ICD-10-CM | POA: Diagnosis not present

## 2020-03-09 DIAGNOSIS — B9689 Other specified bacterial agents as the cause of diseases classified elsewhere: Secondary | ICD-10-CM | POA: Diagnosis not present

## 2020-03-09 DIAGNOSIS — R911 Solitary pulmonary nodule: Secondary | ICD-10-CM | POA: Diagnosis not present

## 2020-03-09 DIAGNOSIS — L732 Hidradenitis suppurativa: Secondary | ICD-10-CM | POA: Diagnosis not present

## 2020-03-09 DIAGNOSIS — L03311 Cellulitis of abdominal wall: Secondary | ICD-10-CM | POA: Diagnosis not present

## 2020-03-09 DIAGNOSIS — E876 Hypokalemia: Secondary | ICD-10-CM | POA: Diagnosis not present

## 2020-03-09 DIAGNOSIS — D72829 Elevated white blood cell count, unspecified: Secondary | ICD-10-CM | POA: Diagnosis not present

## 2020-03-09 DIAGNOSIS — K5900 Constipation, unspecified: Secondary | ICD-10-CM | POA: Diagnosis not present

## 2020-03-09 DIAGNOSIS — A419 Sepsis, unspecified organism: Secondary | ICD-10-CM | POA: Diagnosis not present

## 2020-03-09 DIAGNOSIS — R000 Tachycardia, unspecified: Secondary | ICD-10-CM | POA: Diagnosis not present

## 2020-03-09 DIAGNOSIS — L02211 Cutaneous abscess of abdominal wall: Secondary | ICD-10-CM | POA: Diagnosis not present

## 2020-03-09 DIAGNOSIS — F319 Bipolar disorder, unspecified: Secondary | ICD-10-CM | POA: Diagnosis not present

## 2020-03-16 ENCOUNTER — Telehealth: Payer: Self-pay | Admitting: *Deleted

## 2020-03-16 DIAGNOSIS — S31609A Unspecified open wound of abdominal wall, unspecified quadrant with penetration into peritoneal cavity, initial encounter: Secondary | ICD-10-CM | POA: Diagnosis not present

## 2020-03-16 NOTE — Telephone Encounter (Signed)
Received fax requesting medical records for office visits for patient by 03/22/20 (reference clalim #[id number]).  RN called claims examiner, Carlisle Beers at [phone] and let her know that the patient is scheduled for her first office visit 11/11, that Dr Ninetta Lights is unable to complete this paperwork until the patient is seen in clinic. Left call back number if Markus Jarvis has further questions. Andree Coss, RN

## 2020-03-17 ENCOUNTER — Inpatient Hospital Stay: Payer: BC Managed Care – PPO | Admitting: Infectious Diseases

## 2020-03-17 DIAGNOSIS — S31609A Unspecified open wound of abdominal wall, unspecified quadrant with penetration into peritoneal cavity, initial encounter: Secondary | ICD-10-CM | POA: Diagnosis not present

## 2020-03-25 DIAGNOSIS — L02211 Cutaneous abscess of abdominal wall: Secondary | ICD-10-CM | POA: Diagnosis not present

## 2020-04-07 ENCOUNTER — Inpatient Hospital Stay: Payer: BC Managed Care – PPO | Admitting: Infectious Diseases

## 2020-04-12 ENCOUNTER — Other Ambulatory Visit: Payer: Self-pay

## 2020-04-12 ENCOUNTER — Ambulatory Visit (INDEPENDENT_AMBULATORY_CARE_PROVIDER_SITE_OTHER): Payer: BC Managed Care – PPO | Admitting: Family Medicine

## 2020-04-12 ENCOUNTER — Encounter: Payer: Self-pay | Admitting: Family Medicine

## 2020-04-12 VITALS — BP 118/76 | HR 101 | Temp 96.6°F | Ht 65.0 in | Wt 240.0 lb

## 2020-04-12 DIAGNOSIS — E119 Type 2 diabetes mellitus without complications: Secondary | ICD-10-CM | POA: Diagnosis not present

## 2020-04-12 DIAGNOSIS — M7711 Lateral epicondylitis, right elbow: Secondary | ICD-10-CM | POA: Diagnosis not present

## 2020-04-12 DIAGNOSIS — G4700 Insomnia, unspecified: Secondary | ICD-10-CM

## 2020-04-12 MED ORDER — OZEMPIC (0.25 OR 0.5 MG/DOSE) 2 MG/1.5ML ~~LOC~~ SOPN: PEN_INJECTOR | SUBCUTANEOUS | 0 refills | Status: DC

## 2020-04-12 NOTE — Progress Notes (Deleted)
Patient is a 34 year old woman with a history of diabetes, hidradenitis suppurativa, bipolar disorder, and abdominal wall abscess who was hospitalized September 13 through September 21 after presenting with worsening abdominal wall abscess and right lower abdominal swelling.  She was initially treated with broad-spectrum antibiotics and seen in consultation by general surgery as well as Dr. Ninetta Lights.  She underwent incision and drainage of abdominal wall abscess and soft tissue debridement on September 14.  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  Her antibiotics were narrowed to Unasyn for 2 weeks and then subsequently plan to take oral Augmentin for several months.  It was recommended that she have repeat abdominal imaging in 1 to 2 months.  She had follow-up appointment scheduled with Dr. Ninetta Lights on October 21 and November 11, however, she canceled these appointments and presents today for follow-up.

## 2020-04-12 NOTE — Patient Instructions (Addendum)
Melatonin 3-5 mg, take 1-2 hours before   Sleep hygiene checklist: 1. Avoid naps during the day 2. Avoid stimulants such as caffeine and nicotine. Avoid bedtime alcohol (it can speed onset of sleep but the body's metabolism can cause awakenings). At least 2 hours before bedtime 3. All forms of exercise help ensure sound sleep - limit vigorous exercise to morning or late afternoon 4. Avoid food too close to bedtime including chocolate (which contains caffeine) 5. Soak up natural light 6. Establish regular bedtime routine. 7. Associate bed with sleep - avoid TV, computer or phone, reading while in bed. 8. Ensure pleasant, relaxing sleep environment - quiet, dark, cool room.  Good Sleep Hygiene Habits -- Got to bed and wake up within an hour of the same time every day -- Avoid bright screens (from laptop, phone, TV) within at least 30 minutes before bed. The "blue light" supresses the sleep hormone melatonin and the content may stimulate as well -- Maintain a quiet and dark sleep environment (blackout curtains, turn on a fan or white noise to block out disruptive sounds) -- Practicing relaxing activites before bed (taking a shower, reading a book, journaling, meditation app) -- To quiet a busy mind -- consider journaling before bed (jotting down reminders, worry thoughts, as well as positive things like a gratitude list)   Begin a Mindfulness/Meditation practice -- this can take a little as 3 minutes -- You can find resources in books -- Or you can download apps like  ---- Headspace App (which currently has free content called "Weathering the Storm") ---- Calm (which has a few free options)  ---- Insignt Timer ---- Stop, Breathe & Think  # With each of these Apps - you should decline the "start free trial" offer and as you search through the App should be able to access some of their free content. You can also chose to pay for the content if you find one that works well for you.   # Many  of them also offer sleep specific content which may help with insomnia      Tennis Elbow Tennis elbow is swelling (inflammation) in your outer forearm, near your elbow. Swelling affects the tissues that connect muscle to bone (tendons). Tennis elbow can happen in any sport or job in which you use your elbow too much. It is caused by doing the same motion over and over. Tennis elbow can cause:  Pain and tenderness in your forearm and the outer part of your elbow. You may have pain all the time, or only when using the arm.  A burning feeling. This runs from your elbow through your arm.  Weak grip in your hand. Follow these instructions at home: Activity  Rest your elbow and wrist. Avoid activities that cause problems, as told by your doctor.  If told by your doctor, wear an elbow strap to reduce stress on the area.  Do physical therapy exercises as told.  If you lift an object, lift it with your palm facing up. This is easier on your elbow. Lifestyle  If your tennis elbow is caused by sports, check your equipment and make sure that: ? You are using it correctly. ? It fits you well.  If your tennis elbow is caused by work or by using a computer, take breaks often to stretch your arm. Talk with your manager about how you can manage your condition at work. If you have a brace:  Wear the brace as told by your doctor.  Remove it only as told by your doctor.  Loosen the brace if your fingers tingle, get numb, or turn cold and blue.  Keep the brace clean.  If the brace is not waterproof, ask your doctor if you may take the brace off for bathing. If you must keep the brace on while bathing: ? Do not let it get wet. ? Cover it with a watertight covering when you take a bath or a shower. General instructions   If told, put ice on the painful area: ? Put ice in a plastic bag. ? Place a towel between your skin and the bag. ? Leave the ice on for 20 minutes, 2-3 times a day.  Take  over-the-counter and prescription medicines only as told by your doctor.  Keep all follow-up visits as told by your doctor. This is important. Contact a doctor if:  Your pain does not get better with treatment.  Your pain gets worse.  You have weakness in your forearm, hand, or fingers.  You cannot feel your forearm, hand, or fingers. Summary  Tennis elbow is swelling (inflammation) in your outer forearm, near your elbow.  Tennis elbow is caused by doing the same motion over and over.  Rest your elbow and wrist. Avoid activities that cause problems, as told by your doctor.  If told, put ice on the painful area for 20 minutes, 2-3 times a day. This information is not intended to replace advice given to you by your health care provider. Make sure you discuss any questions you have with your health care provider. Document Revised: 02/07/2018 Document Reviewed: 02/26/2017 Elsevier Patient Education  2020 [employer].

## 2020-04-12 NOTE — Assessment & Plan Note (Signed)
NSAIDs and bracing. If not improving would recommend PT

## 2020-04-12 NOTE — Assessment & Plan Note (Signed)
Sleep hygiene handout. Encouraged good sleep habits and trial of taking medication 1-2 hours before bed. Also advised trying melatonin vs advil PM or just getting zzzquil if not needing pain reliever

## 2020-04-12 NOTE — Progress Notes (Signed)
Subjective:     Haley [name] is a 34 y.o. female presenting for Arm Pain (R since removal of port 03/07/20)     HPI   #Arm pain - starts at wrist and travels up to the elbow - pulling up the sleeve hurts - even having difficulty holding her phone - due to pain in her elbow and wrist - worse with holding things or sleeping - achy pain - wrist pain is the dorsal surface - elbow pain is in the fossa - no numbness or tingling - no fever or chills   #Insomnia - taking advil pm, but difficulty falling asleep - stays up until 2:30 AM - talks on the phone until midnight   Review of Systems   Social History   Tobacco Use  Smoking Status Former Smoker  . Years: 15.00  . Types: Cigars  . Quit date: 02/03/2020  . Years since quitting: 0.1  Smokeless Tobacco Never Used  Tobacco Comment   1-2 cigars per day        Objective:    BP Readings from Last 3 Encounters:  04/12/20 118/76  02/25/20 114/60  02/16/20 110/60   Wt Readings from Last 3 Encounters:  04/12/20 240 lb (108.9 kg)  02/25/20 240 lb (108.9 kg)  02/09/20 240 lb (108.9 kg)    BP 118/76   Pulse (!) 101   Temp (!) 96.6 F (35.9 C) (Temporal)   Ht 5\' 5"  (1.651 m)   Wt 240 lb (108.9 kg)   LMP 03/21/2020 (Within Days)   SpO2 98%   BMI 39.94 kg/m    Physical Exam Constitutional:      General: She is not in acute distress.    Appearance: She is well-developed. She is not diaphoretic.  HENT:     Right Ear: External ear normal.     Left Ear: External ear normal.  Eyes:     Conjunctiva/sclera: Conjunctivae normal.  Cardiovascular:     Rate and Rhythm: Normal rate and regular rhythm.     Heart sounds: No murmur heard.   Pulmonary:     Effort: Pulmonary effort is normal. No respiratory distress.     Breath sounds: Normal breath sounds. No wheezing.  Musculoskeletal:     Cervical back: Neck supple.     Comments: Right Elbow: Inspection: small skin changes from port Palpation: TTP along the  lateral epicondyl ROM: norma Strength: normal, but with pain the lateral epicondyl with resisted wrist extension and with supination/pronation  Right wrist: no ttp, no swelling, neg phalen and tinel  Skin:    General: Skin is warm and dry.     Capillary Refill: Capillary refill takes less than 2 seconds.  Neurological:     Mental Status: She is alert. Mental status is at baseline.  Psychiatric:        Mood and Affect: Mood normal.        Behavior: Behavior normal.           Assessment & Plan:   Problem List Items Addressed This Visit      Endocrine   Type 2 diabetes mellitus without complication, without long-term current use of insulin (HCC) - Primary    Lab Results  Component Value Date   HGBA1C 10.8 (H) 02/08/2020   Pt on insulin with improved control and working on diet, but requests ozempic trial to help with weight loss. Home CBG ~170 so would benefit from additional agent. Start ozempic. Cont lantus and metformin 1000 mg BID.  Return in 2 months to check in.       Relevant Medications   Semaglutide,0.25 or 0.5MG /DOS, (OZEMPIC, 0.25 OR 0.5 MG/DOSE,) 2 MG/1.5ML SOPN     Musculoskeletal and Integument   Lateral epicondylitis of right elbow    NSAIDs and bracing. If not improving would recommend PT        Other   Insomnia    Sleep hygiene handout. Encouraged good sleep habits and trial of taking medication 1-2 hours before bed. Also advised trying melatonin vs advil PM or just getting zzzquil if not needing pain reliever          Return in about 2 months (around 06/12/2020) for for diabetes.  Lynnda Child, MD  This visit occurred during the SARS-CoV-2 public health emergency.  Safety protocols were in place, including screening questions prior to the visit, additional usage of staff PPE, and extensive cleaning of exam room while observing appropriate contact time as indicated for disinfecting solutions.

## 2020-04-12 NOTE — Assessment & Plan Note (Signed)
Lab Results  Component Value Date   HGBA1C 10.8 (H) 02/08/2020   Pt on insulin with improved control and working on diet, but requests ozempic trial to help with weight loss. Home CBG ~170 so would benefit from additional agent. Start ozempic. Cont lantus and metformin 1000 mg BID. Return in 2 months to check in.

## 2020-04-13 ENCOUNTER — Inpatient Hospital Stay: Payer: BC Managed Care – PPO | Admitting: Internal Medicine

## 2020-04-14 ENCOUNTER — Telehealth: Payer: Self-pay | Admitting: *Deleted

## 2020-04-14 NOTE — Telephone Encounter (Signed)
Received fax from [facility] requesting PA for Ozempic.  PA completed on CoverMyMeds.  PA approved from 03/15/2020 through 04/14/2023.  [facility] notified of approval via fax.

## 2020-05-04 DIAGNOSIS — E119 Type 2 diabetes mellitus without complications: Secondary | ICD-10-CM | POA: Diagnosis not present

## 2020-05-16 ENCOUNTER — Telehealth: Payer: Self-pay

## 2020-05-16 NOTE — Telephone Encounter (Signed)
[facility] - Client TELEPHONE ADVICE RECORD [facility] Patient Name: ESTELA VINAL Gender: Female DOB: [birth year] Age: 34 Y 11 M 3 D Return Phone Number: [phone] (Primary) Address: City/State/Zip: [place] [address] Client [facility] - Client Client Site [facility] - Day Physician Gweneth Dimitri- MD Contact Type Call Who Is Calling Patient / Member / Family / Caregiver Call Type Triage / Clinical Relationship To Patient Self Return Phone Number [phone] (Primary) Chief Complaint Diarrhea Reason for Call Symptomatic / Request for Health Information Initial Comment Caller states she has nausea, diarrhea,fatigue and her burps smells like rotten eggs. Translation No Nurse Assessment Nurse: Theressa Millard, RN, Bjorn Loser Date/Time (Eastern Time): 05/13/2020 7:38:09 PM Confirm and document reason for call. If symptomatic, describe symptoms. ---Caller states she has had nausea, diarrhea and "sulfur burps" off and on since 12/9. No vomiting. No fever. Diarrhea 3-4 x / day. Does the patient have any new or worsening symptoms? ---Yes Will a triage be completed? ---Yes Related visit to physician within the last 2 weeks? ---No Does the PT have any chronic conditions? (i.e. diabetes, asthma, this includes High risk factors for pregnancy, etc.) ---Yes List chronic conditions. ---diabetes Is the patient pregnant or possibly pregnant? (Ask all females between the ages of 40-55) ---No Is this a behavioral health or substance abuse call? ---No Guidelines Guideline Title Affirmed Question Affirmed Notes Nurse Date/Time (Eastern Time) Diarrhea [1] MILD diarrhea (e.g., 1-3 or more stools than normal in past 24 hours) without known cause AND [2] present > 7 days Theressa Millard, RN, Bjorn Loser 05/13/2020 7:42:22 PM Disp. Time Lamount Cohen Time) Disposition Final User 05/13/2020 7:00:28 PM Attempt made - message left Theressa Millard, RN,  Bjorn Loser 05/13/2020 7:12:52 PM Attempt made - message left Theressa Millard, RN, Bjorn Loser 05/13/2020 7:45:14 PM SEE PCP WITHIN 3 DAYS Yes Theressa Millard, RN, Bjorn Loser PLEASE NOTE: All timestamps contained within this report are represented as [place] Standard Time. CONFIDENTIALTY NOTICE: This fax transmission is intended only for the addressee. It contains information that is legally privileged, confidential or otherwise protected from use or disclosure. If you are not the intended recipient, you are strictly prohibited from reviewing, disclosing, copying using or disseminating any of this information or taking any action in reliance on or regarding this information. If you have received this fax in error, please notify us immediately by telephone so that we can arrange for its return to [place]. Phone: [phone], Toll-Free: [phone], Fax: [phone] Page: 2 of 2 Call Id: [id number] Caller Disagree/Comply Comply Caller Understands Yes PreDisposition Did not know what to do Care Advice Given Per Guideline SEE PCP WITHIN 3 DAYS: * You need to be seen within 2 or 3 days. * WATER: For mild to moderate diarrhea, water is often the best liquid to drink. You should also eat some salty foods (e.g., potato chips, pretzels, saltine crackers). This is important to make sure you are getting enough salt, sugars, and fluids to meet your body's needs. * Drink more fluids, at least 8 to 10 cups daily. One cup equals 8 oz (240 ml). * AVOID milk and dairy products if these make your diarrhea worse. * AVOID greasy, fatty or spicy foods. * Signs of dehydration occur (e.g., no urine over 12 hours, very dry mouth, lightheaded, etc.) CALL BACK IF: * Constant or severe abdomen pain * You become worse CARE ADVICE given per Diarrhea (Adult) guideline. Comments User: Guadelupe Sabin, RN Date/Time Lamount Cohen Time): 05/13/2020 7:12:07 PM Patient requests a call back  around 7:35 p.m. Referrals REFERRED TO PCP OFFICE

## 2020-05-16 NOTE — Telephone Encounter (Signed)
Pt c/o intermittent diarrhea/nausea for 1 day and fatigue but mostly rotten egg smelling burps. She reports she got rid of the burps with water and baking soda. Pt reports the diarrhea and nausea are now cleared up but she still c/o stomach discomfort (gasy).  Pt denies any fever, SOB, cough, sore throat, changes in taste/smell, severe HA, chills, runny/stuffy nose or vomiting, and also denies any changes in diet. Pt inquired if she should be covid tested. Inquired if pt thinks she has been exposed to anyone that may be positive for covid. She said she is not sure but she works in a call center. She said the only people that wear a mask are those that are not vaccinated. She is vaccinated. She is not sure if anyone is positive there. She does not know of anyone having symptoms there. Advised if she wanted to have a VV with PCP then the PCP could decide if she needed covid tested. Pt said she would call back on her lunch break to make an apt because she had to get back to work.  Advised if she gets any new symptoms to contact the office. Pt verbalized understanding.

## 2020-05-18 NOTE — Telephone Encounter (Signed)
Please call to check in on patient. Does not seem that she has been seen.

## 2020-05-23 ENCOUNTER — Telehealth (INDEPENDENT_AMBULATORY_CARE_PROVIDER_SITE_OTHER): Payer: BC Managed Care – PPO | Admitting: Family Medicine

## 2020-05-23 ENCOUNTER — Other Ambulatory Visit (INDEPENDENT_AMBULATORY_CARE_PROVIDER_SITE_OTHER): Payer: BC Managed Care – PPO | Admitting: Family Medicine

## 2020-05-23 ENCOUNTER — Other Ambulatory Visit: Payer: Self-pay

## 2020-05-23 ENCOUNTER — Other Ambulatory Visit: Payer: BC Managed Care – PPO

## 2020-05-23 DIAGNOSIS — R52 Pain, unspecified: Secondary | ICD-10-CM

## 2020-05-23 DIAGNOSIS — Z20822 Contact with and (suspected) exposure to covid-19: Secondary | ICD-10-CM

## 2020-05-23 LAB — POCT INFLUENZA A/B
Influenza A, POC: NEGATIVE
Influenza B, POC: NEGATIVE

## 2020-05-23 NOTE — Progress Notes (Signed)
Testing for flu and covid recommended as unvaccinated

## 2020-05-23 NOTE — Progress Notes (Signed)
    I connected with Haley [name] on 05/23/20 at 11:20 AM EST by video and verified that I am speaking with the correct person using two identifiers.   I discussed the limitations, risks, security and privacy concerns of performing an evaluation and management service by video and the availability of in person appointments. I also discussed with the patient that there may be a patient responsible charge related to this service. The patient expressed understanding and agreed to proceed.  Patient location: Home Provider Location: [place] Participants: Haley [name] and Haley [name]   Subjective:     Haley [name] is a 34 y.o. female presenting for Chills, Generalized Body Aches, and Cough     HPI  #cough - started on 05/22/2020 - had diarrhea and nausea 05/13/2020 - which was off and on for 1 week and got better with water and baking soda - symptoms resolved on 05/17/2020 - now with cough and body aches - severe chills - could not get warm - did not check temperature - sick contact: no known - funny taste in mouth - not vaccinated against covid or flu    Review of Systems  Constitutional: Positive for chills. Negative for fever.  HENT: Negative for congestion, sinus pressure, sinus pain and sore throat.   Respiratory: Positive for cough. Negative for shortness of breath.   Cardiovascular: Negative for chest pain.  Gastrointestinal: Negative for abdominal pain, diarrhea, nausea and vomiting.  Neurological: Positive for headaches.     Social History   Tobacco Use  Smoking Status Former Smoker  . Years: 15.00  . Types: Cigars  . Quit date: 02/03/2020  . Years since quitting: 0.3  Smokeless Tobacco Never Used  Tobacco Comment   1-2 cigars per day        Objective:   BP Readings from Last 3 Encounters:  04/12/20 118/76  02/25/20 114/60  02/16/20 110/60   Wt Readings from Last 3 Encounters:  04/12/20 240 lb (108.9 kg)  02/25/20 240 lb (108.9 kg)   02/09/20 240 lb (108.9 kg)     There were no vitals taken for this visit.   Speaking in complete sentences No respiratory distress       Assessment & Plan:   Problem List Items Addressed This Visit   None   Visit Diagnoses    Suspected COVID-19 virus infection    -  Primary     Discussed OTC treatment for viral illness Patient will come today for drive-up testing Instructed to isolate until the results come back  If negative and symptoms persisting will call back for consideration for course of antibiotics  Given onset, will also test for flu.  If positive will treat   Interactive audio and video telecommunications were attempted between this provider and patient, however failed, due to patient having technical difficulties OR patient did not have access to video capability.  We continued and completed visit with audio only.   Start Time: 11:17 End Time: 11:27    No follow-ups on file.  Haley Child, MD

## 2020-05-24 ENCOUNTER — Telehealth: Payer: BC Managed Care – PPO | Admitting: Internal Medicine

## 2020-05-25 ENCOUNTER — Telehealth: Payer: Self-pay | Admitting: Family Medicine

## 2020-05-25 ENCOUNTER — Telehealth: Payer: Self-pay

## 2020-05-25 LAB — NOVEL CORONAVIRUS, NAA: SARS-CoV-2, NAA: DETECTED — AB

## 2020-05-25 LAB — SARS-COV-2, NAA 2 DAY TAT

## 2020-05-25 NOTE — Telephone Encounter (Signed)
Error

## 2020-05-25 NOTE — Telephone Encounter (Signed)
Notified team for infusion to add her to list. If eligible she will be contacted.

## 2020-05-25 NOTE — Telephone Encounter (Signed)
Ms. Schue notified as instructed by telephone.  Patient states understanding. 

## 2020-05-25 NOTE — Telephone Encounter (Signed)
Patient called office given positive test results has not had any covid immunizations and would like to be set up for infusion.   Symptoms onset 05/22/2020 Positive today  Symptoms: Body aches, chills, fever with productive cough.

## 2020-06-02 ENCOUNTER — Ambulatory Visit: Payer: BC Managed Care – PPO | Admitting: Family Medicine

## 2020-06-02 DIAGNOSIS — Z0289 Encounter for other administrative examinations: Secondary | ICD-10-CM

## 2020-07-13 ENCOUNTER — Other Ambulatory Visit: Payer: Self-pay

## 2020-07-13 ENCOUNTER — Ambulatory Visit (INDEPENDENT_AMBULATORY_CARE_PROVIDER_SITE_OTHER): Payer: BC Managed Care – PPO | Admitting: Family Medicine

## 2020-07-13 ENCOUNTER — Ambulatory Visit: Payer: BC Managed Care – PPO | Admitting: Dermatology

## 2020-07-13 VITALS — BP 120/76 | HR 83 | Temp 98.2°F | Ht 65.0 in | Wt 236.0 lb

## 2020-07-13 DIAGNOSIS — E1165 Type 2 diabetes mellitus with hyperglycemia: Secondary | ICD-10-CM | POA: Diagnosis not present

## 2020-07-13 DIAGNOSIS — F17290 Nicotine dependence, other tobacco product, uncomplicated: Secondary | ICD-10-CM

## 2020-07-13 DIAGNOSIS — N898 Other specified noninflammatory disorders of vagina: Secondary | ICD-10-CM | POA: Diagnosis not present

## 2020-07-13 DIAGNOSIS — E119 Type 2 diabetes mellitus without complications: Secondary | ICD-10-CM

## 2020-07-13 LAB — POCT GLYCOSYLATED HEMOGLOBIN (HGB A1C): Hemoglobin A1C: 12.1 % — AB (ref 4.0–5.6)

## 2020-07-13 MED ORDER — NICOTINE POLACRILEX 2 MG MT GUM: 2.0000 mg | CHEWING_GUM | OROMUCOSAL | 0 refills | Status: DC | PRN

## 2020-07-13 MED ORDER — LANTUS SOLOSTAR 100 UNIT/ML ~~LOC~~ SOPN
24.0000 [IU] | PEN_INJECTOR | Freq: Every day | SUBCUTANEOUS | 3 refills | Status: DC
Start: 2020-07-13 — End: 2020-12-15

## 2020-07-13 MED ORDER — OZEMPIC (0.25 OR 0.5 MG/DOSE) 2 MG/1.5ML ~~LOC~~ SOPN: PEN_INJECTOR | SUBCUTANEOUS | 0 refills | Status: DC

## 2020-07-13 MED ORDER — METFORMIN HCL 1000 MG PO TABS
1000.0000 mg | ORAL_TABLET | Freq: Two times a day (BID) | ORAL | 1 refills | Status: DC
Start: 2020-07-13 — End: 2021-08-28

## 2020-07-13 NOTE — Progress Notes (Signed)
Subjective:     Haley [name] is a 35 y.o. female presenting for medication consultation (Wants help to quit smoking.) and Medication Refill (Lantus )     HPI  #Diabetes Currently taking stopped all medications  Has not been checking her sugars Thinks she has a yeast infection Not following diet Last HgbA1c:  Lab Results  Component Value Date   HGBA1C 12.1 (A) 07/13/2020     Diabetes Health Maintenance Due:    Diabetes Health Maintenance Due  Topic Date Due  . FOOT EXAM  Never done  . OPHTHALMOLOGY EXAM  Never done  . URINE MICROALBUMIN  Never done  . HEMOGLOBIN A1C  01/10/2021    #Cigars use - interested in quitting - has a lot of phlegm - has quit before cold [place] - has not used patches or lozenges - 1-2 cigars daily -   Review of Systems   Social History   Tobacco Use  Smoking Status Former Smoker  . Years: 15.00  . Types: Cigars  . Quit date: 02/03/2020  . Years since quitting: 0.4  Smokeless Tobacco Never Used  Tobacco Comment   1-2 cigars per day        Objective:    BP Readings from Last 3 Encounters:  07/13/20 120/76  04/12/20 118/76  02/25/20 114/60   Wt Readings from Last 3 Encounters:  07/13/20 236 lb (107 kg)  04/12/20 240 lb (108.9 kg)  02/25/20 240 lb (108.9 kg)    BP 120/76   Pulse 83   Temp 98.2 F (36.8 C) (Temporal)   Ht 5\' 5"  (1.651 m)   Wt 236 lb (107 kg)   LMP 07/11/2020 (Exact Date)   SpO2 97%   BMI 39.27 kg/m    Physical Exam Constitutional:      General: She is not in acute distress.    Appearance: She is well-developed. She is not diaphoretic.  HENT:     Right Ear: External ear normal.     Left Ear: External ear normal.     Nose: Nose normal.  Eyes:     Conjunctiva/sclera: Conjunctivae normal.  Cardiovascular:     Rate and Rhythm: Normal rate.  Pulmonary:     Effort: Pulmonary effort is normal.  Genitourinary:    Comments: Exam deferred due to time Musculoskeletal:     Cervical back: Neck  supple.  Skin:    General: Skin is warm and dry.     Capillary Refill: Capillary refill takes less than 2 seconds.  Neurological:     Mental Status: She is alert. Mental status is at baseline.  Psychiatric:        Mood and Affect: Mood normal.        Behavior: Behavior normal.           Assessment & Plan:   Problem List Items Addressed This Visit      Endocrine   Type 2 diabetes mellitus with hyperglycemia (HCC) - Primary    Poorly controlled. Pt was doing well on insulin at last OV but ran out 2 months. She never started ozempic and stopped her metformin. Long discussion about the importance of taking medication. Referral to diabetes education - strongly encouraged going. Restart Meformin 1000 mg BID. Restart lantus 24 units - check CBG. If still elevated, start ozempic in 1 week. Return in 6 weeks for follow-up       Relevant Medications   insulin glargine (LANTUS SOLOSTAR) 100 UNIT/ML Solostar Pen   Semaglutide,0.25 or 0.5MG /DOS, (  OZEMPIC, 0.25 OR 0.5 MG/DOSE,) 2 MG/1.5ML SOPN   metFORMIN (GLUCOPHAGE) 1000 MG tablet     Other   Cigar smoker    Has quit before w/ success. Discussed trial of nicotine gum. Prescription. If limited improvement - would recommend patches.       Relevant Medications   nicotine polacrilex ([employer] NICOTINE) 2 MG gum   Vaginal discharge    At end of visit - mentioned she thought she had a yeast infection. Given diabetes, this is likely the cause. Will send wet mount to r/o BV      Relevant Orders   WET PREP BY MOLECULAR PROBE       Return in about 6 weeks (around 08/24/2020).  Lynnda Child, MD  This visit occurred during the SARS-CoV-2 public health emergency.  Safety protocols were in place, including screening questions prior to the visit, additional usage of staff PPE, and extensive cleaning of exam room while observing appropriate contact time as indicated for disinfecting solutions.

## 2020-07-13 NOTE — Patient Instructions (Addendum)
Tobacco - try gum  - if no improvement we can consider patches -- mychart or call and I will send this in    Controlling diabetes is important for improving your overall health.   Lab Results  Component Value Date   HGBA1C 12.1 (A) 07/13/2020    Restart Metformin  Restart Lantus 24 units at night  After 1 week if tolerating lantus and metformin -- then start Ozempic   Fasting Blood sugar <140 2 hours post meals blood sugar <180  If blood sugar <70, decrease your lantus by 4 units.    How can you treat your diabetes 1) Intensive Lifestyle Program - [facility]  2) Regular exercise - 30 minutes of moderate activity, 5 times a week 3) Weight loss - only 7% 4) Modifying your diet - limit food high in sugar - go to [email or web address] to learn more 5) Medication

## 2020-07-13 NOTE — Assessment & Plan Note (Signed)
At end of visit - mentioned she thought she had a yeast infection. Given diabetes, this is likely the cause. Will send wet mount to r/o BV

## 2020-07-13 NOTE — Assessment & Plan Note (Signed)
Poorly controlled. Pt was doing well on insulin at last OV but ran out 2 months. She never started ozempic and stopped her metformin. Long discussion about the importance of taking medication. Referral to diabetes education - strongly encouraged going. Restart Meformin 1000 mg BID. Restart lantus 24 units - check CBG. If still elevated, start ozempic in 1 week. Return in 6 weeks for follow-up

## 2020-07-13 NOTE — Assessment & Plan Note (Signed)
Has quit before w/ success. Discussed trial of nicotine gum. Prescription. If limited improvement - would recommend patches.

## 2020-07-14 ENCOUNTER — Telehealth: Payer: Self-pay | Admitting: Family Medicine

## 2020-07-14 LAB — WET PREP BY MOLECULAR PROBE
Candida species: NOT DETECTED
Gardnerella vaginalis: NOT DETECTED
MICRO NUMBER:: 11541986
SPECIMEN QUALITY:: ADEQUATE
Trichomonas vaginosis: NOT DETECTED

## 2020-07-15 NOTE — Telephone Encounter (Signed)
Error

## 2020-07-22 ENCOUNTER — Telehealth: Payer: Self-pay

## 2020-07-22 NOTE — Telephone Encounter (Signed)
Fyi to pcp

## 2020-07-22 NOTE — Telephone Encounter (Signed)
[facility] Day - Client TELEPHONE ADVICE RECORD AccessNurse Patient Name: Haley [name] Gender: Female DOB: Feb 10, 1986 Age: 35 Y 1 M 10 D Return Phone Number: [phone] (Primary) Address: City/State/Zip: [place] [address] Client [facility] Day - Client Client Site  [facility] [place] - Day Physician Gweneth Dimitri- MD Contact Type Call Who Is Calling Patient / Member / Family / Caregiver Call Type Triage / Clinical Relationship To Patient Self Return Phone Number [phone] (Primary) Chief Complaint Urination Pain Reason for Call Symptomatic / Request for Health Information Initial Comment caller states Denisha from Dr. Elmyra Ricks office, she has a pt that has back pain and pain when urinating and she needs to be triaged GOTO Facility Not Listed UC Translation No Nurse Assessment Nurse: Risa Grill, RN, Lambert Mody Date/Time (Eastern Time): 07/22/2020 9:09:10 AM Confirm and document reason for call. If symptomatic, describe symptoms. ---Caller states painful urination and back pain, started 3 days ago. no fever Does the patient have any new or worsening symptoms? ---Yes Will a triage be completed? ---Yes Related visit to physician within the last 2 weeks? ---No Does the PT have any chronic conditions? (i.e. diabetes, asthma, this includes High risk factors for pregnancy, etc.) ---Yes List chronic conditions. ---DM Is the patient pregnant or possibly pregnant? (Ask all females between the ages of 64-55) ---No Is this a behavioral health or substance abuse call? ---No Guidelines Guideline Title Affirmed Question Affirmed Notes Nurse Date/Time Lamount Cohen Time) Urination Pain - Female Side (flank) or lower back pain present Risa Grill, Charity fundraiser, Tracie 07/22/2020 9:09:49 AM Disp. Time Lamount Cohen Time) Disposition Final User 07/22/2020 8:54:49 AM Attempt made - no message left Risa Grill, RN, Lambert Mody 07/22/2020 9:10:50 AM See HCP within 4  Hours (or PCP triage) Yes Risa Grill, RN, Lambert Mody PLEASE NOTE: All timestamps contained within this report are represented as [place] Standard Time. CONFIDENTIALTY NOTICE: This fax transmission is intended only for the addressee. It contains information that is legally privileged, confidential or otherwise protected from use or disclosure. If you are not the intended recipient, you are strictly prohibited from reviewing, disclosing, copying using or disseminating any of this information or taking any action in reliance on or regarding this information. If you have received this fax in error, please notify us immediately by telephone so that we can arrange for its return to [place]. Phone: [phone], Toll-Free: [phone], Fax: [phone] Page: 2 of 2 Call Id: [id number] Caller Disagree/Comply Comply Caller Understands Yes PreDisposition Call Doctor Care Advice Given Per Guideline SEE HCP (OR PCP TRIAGE) WITHIN 4 HOURS: * IF OFFICE WILL BE OPEN: You need to be seen within the next 3 or 4 hours. Call your doctor (or NP/PA) now or as soon as the office opens. PAIN MEDICINES: * For pain relief, you can take either acetaminophen, ibuprofen, or naproxen. * ACETAMINOPHEN - REGULAR STRENGTH TYLENOL: Take 650 mg (two 325 mg pills) by mouth every 4 to 6 hours as needed. Each Regular Strength Tylenol pill has 325 mg of acetaminophen. The most you should take each day is 3,250 mg (10 pills a day). * IBUPROFEN (E.G., MOTRIN, ADVIL): Take 400 mg (two 200 mg pills) by mouth every 6 hours. The most you should take each day is 1,200 mg (six 200 mg pills), unless your doctor has told you to take more. CALL BACK IF: * You become worse CARE ADVICE given per Urination Pain - Female (Adult) guideline. Comments User: Theresa Mulligan, RN Date/Time Lamount Cohen Time): 07/22/2020 8:54:33 AM no voice  mail avail User: Theresa Mulligan, RN Date/Time Lamount Cohen Time): 07/22/2020 9:13:03 AM no appt avail today per backline  [phone] Referrals Warm transfer to backline GO TO FACILITY OTHER - SPECIFY

## 2020-07-22 NOTE — Telephone Encounter (Signed)
Noted, Agee earlier appointment may be helpful.

## 2020-07-22 NOTE — Telephone Encounter (Signed)
Denisha at front desk phones said that pt was offered sooner appts but declined appts until Wed. Sending note to DR Milinda Antis who pt already has appt with 07/27/20 at 11:30.

## 2020-07-27 ENCOUNTER — Encounter: Payer: Self-pay | Admitting: Family Medicine

## 2020-07-27 ENCOUNTER — Other Ambulatory Visit: Payer: Self-pay

## 2020-07-27 ENCOUNTER — Ambulatory Visit (INDEPENDENT_AMBULATORY_CARE_PROVIDER_SITE_OTHER): Payer: BC Managed Care – PPO | Admitting: Family Medicine

## 2020-07-27 VITALS — BP 118/68 | HR 95 | Temp 96.9°F | Ht 65.0 in | Wt 236.2 lb

## 2020-07-27 DIAGNOSIS — N3000 Acute cystitis without hematuria: Secondary | ICD-10-CM | POA: Diagnosis not present

## 2020-07-27 DIAGNOSIS — R300 Dysuria: Secondary | ICD-10-CM | POA: Diagnosis not present

## 2020-07-27 DIAGNOSIS — N390 Urinary tract infection, site not specified: Secondary | ICD-10-CM | POA: Insufficient documentation

## 2020-07-27 LAB — POC URINALSYSI DIPSTICK (AUTOMATED)
Blood, UA: NEGATIVE
Glucose, UA: NEGATIVE
Ketones, UA: NEGATIVE
Nitrite, UA: NEGATIVE
Protein, UA: POSITIVE — AB
Spec Grav, UA: 1.025 (ref 1.010–1.025)
Urobilinogen, UA: 0.2 E.U./dL
pH, UA: 6 (ref 5.0–8.0)

## 2020-07-27 MED ORDER — FLUCONAZOLE 150 MG PO TABS: 150.0000 mg | ORAL_TABLET | Freq: Once | ORAL | 0 refills | Status: AC

## 2020-07-27 MED ORDER — CEPHALEXIN 500 MG PO CAPS: 500.0000 mg | ORAL_CAPSULE | Freq: Two times a day (BID) | ORAL | 0 refills | Status: DC

## 2020-07-27 NOTE — Progress Notes (Signed)
Subjective:    Patient ID: Haley [name], female    DOB: 1986/04/15, 35 y.o.   MRN: [id number]  This visit occurred during the SARS-CoV-2 public health emergency.  Safety protocols were in place, including screening questions prior to the visit, additional usage of staff PPE, and extensive cleaning of exam room while observing appropriate contact time as indicated for disinfecting solutions.    HPI 35 yo [name] presents with dysuria and back pain   Wt Readings from Last 3 Encounters:  07/27/20 236 lb 3 oz (107.1 kg)  07/13/20 236 lb (107 kg)  04/12/20 240 lb (108.9 kg)   39.30 kg/m  Having pain at end of urination  No blood  Some frequency  Low back hurts in the middle   (a little worse when moving)  Bladder pressure to urinate   Taking azo and drinking cranberry juice   No urine odor   Drinking lots of water   Fatigue  No fever or nausea   No chance pregnant   Had recent itching and d/c  That is better (did not treat) Is diabetic and may be prone to uti  UA: Results for orders placed or performed in visit on 07/27/20  POCT Urinalysis Dipstick (Automated)  Result Value Ref Range   Color, UA Dark Yellow    Clarity, UA Cloudy    Glucose, UA Negative Negative   Bilirubin, UA Trace    Ketones, UA Negative    Spec Grav, UA 1.025 1.010 - 1.025   Blood, UA Negative    pH, UA 6.0 5.0 - 8.0   Protein, UA Positive (A) Negative   Urobilinogen, UA 0.2 0.2 or 1.0 E.U./dL   Nitrite, UA Negative    Leukocytes, UA Moderate (2+) (A) Negative   Patient Active Problem List   Diagnosis Date Noted  . UTI (urinary tract infection) 07/27/2020  . Cigar smoker 07/13/2020  . Vaginal discharge 07/13/2020  . Lateral epicondylitis of right elbow 04/12/2020  . Insomnia 04/12/2020  . Hypoalbuminemia 02/08/2020  . Iron deficiency anemia due to chronic blood loss 02/08/2020  . Type 2 diabetes mellitus with hyperglycemia ([place]) 11/12/2019  . Hidradenitis suppurativa 11/12/2019    Past Medical History:  Diagnosis Date  . Diabetes mellitus   . Hidradenitis suppurativa   . Psychosis ([place]) 12/16/2018   Past Surgical History:  Procedure Laterality Date  . LAPAROTOMY N/A 02/09/2020   Procedure: INCISION AND DRAINAGE ABDOMINAL WALL ABCESS;  Surgeon: Ileana Roup, MD;  Location: [facility];  Service: General;  Laterality: N/A;  . ROOT CANAL  05/17/2011   Social History   Tobacco Use  . Smoking status: Former Smoker    Years: 15.00    Types: Cigars    Quit date: 02/03/2020    Years since quitting: 0.4  . Smokeless tobacco: Never Used  . Tobacco comment: 1-2 cigars per day  Substance Use Topics  . Alcohol use: Yes    Comment: wine a few times a month  . Drug use: Not Currently    Comment: previous MJ   Family History  Problem Relation Age of Onset  . Drug abuse Mother   . Cancer Father        unknown type  . Kidney failure Father        s/p transplant   No Known Allergies Current Outpatient Medications on File Prior to Visit  Medication Sig Dispense Refill  . Accu-Chek Softclix Lancets lancets SMARTSIG:Topical    . blood glucose  meter kit and supplies Dispense based on patient and insurance preference. Use up to four times daily as directed. (FOR ICD-10 E10.9, E11.9). 1 each 0  . blood glucose meter kit and supplies Dispense based on patient and insurance preference. Use up to four times daily as directed. (FOR ICD-10 E10.9, E11.9). 1 each 0  . insulin glargine (LANTUS SOLOSTAR) 100 UNIT/ML Solostar Pen Inject 24 Units into the skin daily. 15 mL 3  . Insulin Pen Needle 32G X 4 MM MISC 24 Units by Does not apply route at bedtime. 100 each 0  . metFORMIN (GLUCOPHAGE) 1000 MG tablet Take 1 tablet (1,000 mg total) by mouth in the morning and at bedtime. 180 tablet 1  . Naproxen Sod-diphenhydrAMINE (RA NAPROXEN SODIUM PM) 220-25 MG TABS Take 1 capsule by mouth at bedtime as needed (sleep).    . naproxen sodium (ALEVE) 220 MG tablet Take 220 mg by mouth  daily as needed (pain).    . nicotine polacrilex ([employer] NICOTINE) 2 MG gum Take 1 each (2 mg total) by mouth as needed for smoking cessation. 100 tablet 0  . Semaglutide,0.25 or 0.5MG/DOS, (OZEMPIC, 0.25 OR 0.5 MG/DOSE,) 2 MG/1.5ML SOPN Start with 0.25 once weekly for 4 weeks. Then increase to 0.5 once weekly. 3 mL 0   No current facility-administered medications on file prior to visit.    .  Review of Systems  Constitutional: Negative for activity change, appetite change, fatigue, fever and unexpected weight change.  HENT: Negative for congestion, ear pain, rhinorrhea, sinus pressure and sore throat.   Eyes: Negative for pain, redness and visual disturbance.  Respiratory: Negative for cough, shortness of breath and wheezing.   Cardiovascular: Negative for chest pain and palpitations.  Gastrointestinal: Negative for abdominal pain, blood in stool, constipation and diarrhea.  Endocrine: Negative for polydipsia and polyuria.  Genitourinary: Positive for dysuria. Negative for frequency, urgency, vaginal discharge and vaginal pain.  Musculoskeletal: Positive for back pain. Negative for arthralgias and myalgias.  Skin: Negative for pallor and rash.  Allergic/Immunologic: Negative for environmental allergies.  Neurological: Negative for dizziness, syncope and headaches.  Hematological: Negative for adenopathy. Does not bruise/bleed easily.  Psychiatric/Behavioral: Negative for decreased concentration and dysphoric mood. The patient is not nervous/anxious.        Objective:   Physical Exam Constitutional:      General: She is not in acute distress.    Appearance: Normal appearance. She is well-developed and well-nourished. She is obese. She is not ill-appearing.  HENT:     Head: Normocephalic and atraumatic.  Eyes:     Extraocular Movements: EOM normal.     Conjunctiva/sclera: Conjunctivae normal.     Pupils: Pupils are equal, round, and reactive to light.  Cardiovascular:     Rate and  Rhythm: Normal rate and regular rhythm.     Heart sounds: Normal heart sounds.  Pulmonary:     Effort: Pulmonary effort is normal.     Breath sounds: Normal breath sounds.  Abdominal:     General: Bowel sounds are normal. There is no distension.     Palpations: Abdomen is soft.     Tenderness: There is abdominal tenderness. There is no right CVA tenderness, left CVA tenderness or rebound.     Comments: No cva tenderness  no suprapubic tenderness or fullness  Musculoskeletal:        General: No edema.     Cervical back: Normal range of motion and neck supple.     Comments: No spinal tendernes  Lymphadenopathy:     Cervical: No cervical adenopathy.  Skin:    Findings: No rash.  Neurological:     Mental Status: She is alert.  Psychiatric:        Mood and Affect: Mood and affect and mood normal.           Assessment & Plan:   Problem List Items Addressed This Visit      Genitourinary   UTI (urinary tract infection) - Primary    With low back but not flank pain  Positive ua cx pending  t x with keflex and also given diflucan for yeast infection  Handout given on uti and advised good water intake  Meds ordered this encounter  Medications  . cephALEXin (KEFLEX) 500 MG capsule    Sig: Take 1 capsule (500 mg total) by mouth 2 (two) times daily.    Dispense:  14 capsule    Refill:  0  . fluconazole (DIFLUCAN) 150 MG tablet    Sig: Take 1 tablet (150 mg total) by mouth once for 1 dose.    Dispense:  1 tablet    Refill:  0         Relevant Medications   cephALEXin (KEFLEX) 500 MG capsule   fluconazole (DIFLUCAN) 150 MG tablet   Other Relevant Orders   Urine Culture    Other Visit Diagnoses    Dysuria       Relevant Orders   POCT Urinalysis Dipstick (Automated) (Completed)

## 2020-07-27 NOTE — Patient Instructions (Addendum)
Drink lots of water   Take the generic keflex for uti  Take the diflucan for yeast at end of the antibiotic    We will update you with a urine culture result when it comes back    Urinary Tract Infection, Adult  A urinary tract infection (UTI) is an infection of any part of the urinary tract. The urinary tract includes the kidneys, ureters, bladder, and urethra. These organs make, store, and get rid of urine in the body. An upper UTI affects the ureters and kidneys. A lower UTI affects the bladder and urethra. What are the causes? Most urinary tract infections are caused by bacteria in your genital area around your urethra, where urine leaves your body. These bacteria grow and cause inflammation of your urinary tract. What increases the risk? You are more likely to develop this condition if:  You have a urinary catheter that stays in place.  You are not able to control when you urinate or have a bowel movement (incontinence).  You are female and you: ? Use a spermicide or diaphragm for birth control. ? Have low estrogen levels. ? Are pregnant.  You have certain genes that increase your risk.  You are sexually active.  You take antibiotic medicines.  You have a condition that causes your flow of urine to slow down, such as: ? An enlarged prostate, if you are female. ? Blockage in your urethra. ? A kidney stone. ? A nerve condition that affects your bladder control (neurogenic bladder). ? Not getting enough to drink, or not urinating often.  You have certain medical conditions, such as: ? Diabetes. ? A weak disease-fighting system (immunesystem). ? Sickle cell disease. ? Gout. ? Spinal cord injury. What are the signs or symptoms? Symptoms of this condition include:  Needing to urinate right away (urgency).  Frequent urination. This may include small amounts of urine each time you urinate.  Pain or burning with urination.  Blood in the urine.  Urine that smells bad  or unusual.  Trouble urinating.  Cloudy urine.  Vaginal discharge, if you are female.  Pain in the abdomen or the lower back. You may also have:  Vomiting or a decreased appetite.  Confusion.  Irritability or tiredness.  A fever or chills.  Diarrhea. The first symptom in older adults may be confusion. In some cases, they may not have any symptoms until the infection has worsened. How is this diagnosed? This condition is diagnosed based on your medical history and a physical exam. You may also have other tests, including:  Urine tests.  Blood tests.  Tests for STIs (sexually transmitted infections). If you have had more than one UTI, a cystoscopy or imaging studies may be done to determine the cause of the infections. How is this treated? Treatment for this condition includes:  Antibiotic medicine.  Over-the-counter medicines to treat discomfort.  Drinking enough water to stay hydrated. If you have frequent infections or have other conditions such as a kidney stone, you may need to see a health care provider who specializes in the urinary tract (urologist). In rare cases, urinary tract infections can cause sepsis. Sepsis is a life-threatening condition that occurs when the body responds to an infection. Sepsis is treated in the hospital with IV antibiotics, fluids, and other medicines. Follow these instructions at home: Medicines  Take over-the-counter and prescription medicines only as told by your health care provider.  If you were prescribed an antibiotic medicine, take it as told by your health  care provider. Do not stop using the antibiotic even if you start to feel better. General instructions  Make sure you: ? Empty your bladder often and completely. Do not hold urine for long periods of time. ? Empty your bladder after sex. ? Wipe from front to back after urinating or having a bowel movement if you are female. Use each tissue only one time when you  wipe.  Drink enough fluid to keep your urine pale yellow.  Keep all follow-up visits. This is important.   Contact a health care provider if:  Your symptoms do not get better after 1-2 days.  Your symptoms go away and then return. Get help right away if:  You have severe pain in your back or your lower abdomen.  You have a fever or chills.  You have nausea or vomiting. Summary  A urinary tract infection (UTI) is an infection of any part of the urinary tract, which includes the kidneys, ureters, bladder, and urethra.  Most urinary tract infections are caused by bacteria in your genital area.  Treatment for this condition often includes antibiotic medicines.  If you were prescribed an antibiotic medicine, take it as told by your health care provider. Do not stop using the antibiotic even if you start to feel better.  Keep all follow-up visits. This is important. This information is not intended to replace advice given to you by your health care provider. Make sure you discuss any questions you have with your health care provider. Document Revised: 12/25/2019 Document Reviewed: 12/25/2019 Elsevier Patient Education  2021 [employer].

## 2020-07-27 NOTE — Assessment & Plan Note (Signed)
With low back but not flank pain  Positive ua cx pending  t x with keflex and also given diflucan for yeast infection  Handout given on uti and advised good water intake  Meds ordered this encounter  Medications  . cephALEXin (KEFLEX) 500 MG capsule    Sig: Take 1 capsule (500 mg total) by mouth 2 (two) times daily.    Dispense:  14 capsule    Refill:  0  . fluconazole (DIFLUCAN) 150 MG tablet    Sig: Take 1 tablet (150 mg total) by mouth once for 1 dose.    Dispense:  1 tablet    Refill:  0

## 2020-07-28 LAB — URINE CULTURE
MICRO NUMBER:: 11598110
SPECIMEN QUALITY:: ADEQUATE

## 2020-08-03 DIAGNOSIS — L810 Postinflammatory hyperpigmentation: Secondary | ICD-10-CM | POA: Diagnosis not present

## 2020-08-03 DIAGNOSIS — L700 Acne vulgaris: Secondary | ICD-10-CM | POA: Diagnosis not present

## 2020-08-03 DIAGNOSIS — L732 Hidradenitis suppurativa: Secondary | ICD-10-CM | POA: Diagnosis not present

## 2020-08-06 DIAGNOSIS — Z03818 Encounter for observation for suspected exposure to other biological agents ruled out: Secondary | ICD-10-CM | POA: Diagnosis not present

## 2020-08-06 DIAGNOSIS — Z20822 Contact with and (suspected) exposure to covid-19: Secondary | ICD-10-CM | POA: Diagnosis not present

## 2020-08-23 ENCOUNTER — Emergency Department
Admission: EM | Admit: 2020-08-23 | Discharge: 2020-08-23 | Disposition: A | Discharge status: Home / Self Care | Payer: BC Managed Care – PPO | Attending: Emergency Medicine | Admitting: Emergency Medicine

## 2020-08-23 ENCOUNTER — Emergency Department: Payer: BC Managed Care – PPO

## 2020-08-23 ENCOUNTER — Other Ambulatory Visit: Payer: Self-pay

## 2020-08-23 ENCOUNTER — Encounter: Payer: Self-pay | Admitting: Emergency Medicine

## 2020-08-23 DIAGNOSIS — R0602 Shortness of breath: Secondary | ICD-10-CM | POA: Diagnosis not present

## 2020-08-23 DIAGNOSIS — E1165 Type 2 diabetes mellitus with hyperglycemia: Secondary | ICD-10-CM | POA: Diagnosis not present

## 2020-08-23 DIAGNOSIS — Z7984 Long term (current) use of oral hypoglycemic drugs: Secondary | ICD-10-CM | POA: Diagnosis not present

## 2020-08-23 DIAGNOSIS — F17290 Nicotine dependence, other tobacco product, uncomplicated: Secondary | ICD-10-CM | POA: Diagnosis not present

## 2020-08-23 DIAGNOSIS — R000 Tachycardia, unspecified: Secondary | ICD-10-CM | POA: Diagnosis not present

## 2020-08-23 DIAGNOSIS — N3000 Acute cystitis without hematuria: Secondary | ICD-10-CM

## 2020-08-23 DIAGNOSIS — E860 Dehydration: Secondary | ICD-10-CM

## 2020-08-23 DIAGNOSIS — Z794 Long term (current) use of insulin: Secondary | ICD-10-CM | POA: Insufficient documentation

## 2020-08-23 DIAGNOSIS — K529 Noninfective gastroenteritis and colitis, unspecified: Secondary | ICD-10-CM | POA: Diagnosis not present

## 2020-08-23 DIAGNOSIS — R739 Hyperglycemia, unspecified: Secondary | ICD-10-CM

## 2020-08-23 DIAGNOSIS — D72829 Elevated white blood cell count, unspecified: Secondary | ICD-10-CM | POA: Insufficient documentation

## 2020-08-23 DIAGNOSIS — K5900 Constipation, unspecified: Secondary | ICD-10-CM | POA: Diagnosis not present

## 2020-08-23 DIAGNOSIS — R1084 Generalized abdominal pain: Secondary | ICD-10-CM | POA: Diagnosis not present

## 2020-08-23 LAB — URINALYSIS, COMPLETE (UACMP) WITH MICROSCOPIC
Bilirubin Urine: NEGATIVE
Glucose, UA: 50 mg/dL — AB
Hgb urine dipstick: NEGATIVE
Ketones, ur: 5 mg/dL — AB
Nitrite: NEGATIVE
Protein, ur: 100 mg/dL — AB
Specific Gravity, Urine: 1.02 (ref 1.005–1.030)
WBC, UA: >50 WBC/hpf — ABNORMAL HIGH (ref 0–5)
pH: 5 (ref 5.0–8.0)

## 2020-08-23 LAB — COMPREHENSIVE METABOLIC PANEL
ALT: 15 U/L (ref 0–44)
AST: 19 U/L (ref 15–41)
Albumin: 3.7 g/dL (ref 3.5–5.0)
Alkaline Phosphatase: 63 U/L (ref 38–126)
Anion gap: 9 (ref 5–15)
BUN: 13 mg/dL (ref 6–20)
CO2: 23 mmol/L (ref 22–32)
Calcium: 9.2 mg/dL (ref 8.9–10.3)
Chloride: 103 mmol/L (ref 98–111)
Creatinine, Ser: 0.64 mg/dL (ref 0.44–1.00)
GFR, Estimated: >60 mL/min (ref >60)
Glucose, Bld: 239 mg/dL — ABNORMAL HIGH (ref 70–99)
Potassium: 4.3 mmol/L (ref 3.5–5.1)
Sodium: 135 mmol/L (ref 135–145)
Total Bilirubin: 0.8 mg/dL (ref 0.3–1.2)
Total Protein: 8.5 g/dL — ABNORMAL HIGH (ref 6.5–8.1)

## 2020-08-23 LAB — HCG, QUANTITATIVE, PREGNANCY: hCG, Beta Chain, Quant, S: 1 m[IU]/mL (ref <5)

## 2020-08-23 LAB — CBC
HCT: 38 % (ref 36.0–46.0)
Hemoglobin: 12.3 g/dL (ref 12.0–15.0)
MCH: 26.9 pg (ref 26.0–34.0)
MCHC: 32.4 g/dL (ref 30.0–36.0)
MCV: 83 fL (ref 80.0–100.0)
Platelets: 277 10*3/uL (ref 150–400)
RBC: 4.58 MIL/uL (ref 3.87–5.11)
RDW: 12.8 % (ref 11.5–15.5)
WBC: 11.8 10*3/uL — ABNORMAL HIGH (ref 4.0–10.5)
nRBC: 0 % (ref 0.0–0.2)

## 2020-08-23 LAB — CBG MONITORING, ED
Glucose-Capillary: 221 mg/dL — ABNORMAL HIGH (ref 70–99)
Glucose-Capillary: 186 mg/dL — ABNORMAL HIGH (ref 70–99)

## 2020-08-23 LAB — LACTIC ACID, PLASMA: Lactic Acid, Venous: 1.3 mmol/L (ref 0.5–1.9)

## 2020-08-23 LAB — HEMOGLOBIN A1C
Hgb A1c MFr Bld: 9 % — ABNORMAL HIGH (ref 4.8–5.6)
Mean Plasma Glucose: 211.6 mg/dL

## 2020-08-23 LAB — LIPASE, BLOOD: Lipase: 28 U/L (ref 11–51)

## 2020-08-23 MED ORDER — IOHEXOL 300 MG/ML  SOLN
100.0000 mL | Freq: Once | INTRAMUSCULAR | Status: AC | PRN
  Administered 2020-08-23: 100 mL via INTRAVENOUS
  Filled 2020-08-23: qty 100

## 2020-08-23 MED ORDER — INSULIN ASPART 100 UNIT/ML ~~LOC~~ SOLN
0.0000 [IU] | SUBCUTANEOUS | Status: DC
  Administered 2020-08-23: 3 [IU] via SUBCUTANEOUS
  Filled 2020-08-23: qty 1

## 2020-08-23 MED ORDER — SENNOSIDES-DOCUSATE SODIUM 8.6-50 MG PO TABS
2.0000 | ORAL_TABLET | ORAL | Status: AC
  Administered 2020-08-23: 2 via ORAL
  Filled 2020-08-23: qty 2

## 2020-08-23 MED ORDER — CEPHALEXIN 500 MG PO CAPS: 500.0000 mg | ORAL_CAPSULE | Freq: Four times a day (QID) | ORAL | 0 refills | Status: AC

## 2020-08-23 MED ORDER — LACTATED RINGERS IV BOLUS
1000.0000 mL | Freq: Once | INTRAVENOUS | Status: AC
  Administered 2020-08-23: 1000 mL via INTRAVENOUS

## 2020-08-23 MED ORDER — SODIUM CHLORIDE 0.9 % IV SOLN
1.0000 g | Freq: Once | INTRAVENOUS | Status: AC
  Administered 2020-08-23: 1 g via INTRAVENOUS
  Filled 2020-08-23: qty 10

## 2020-08-23 MED ORDER — FLUCONAZOLE 100 MG PO TABS: 150.0000 mg | ORAL_TABLET | Freq: Every day | ORAL | 0 refills | Status: AC

## 2020-08-23 NOTE — ED Provider Notes (Signed)
 [facility] Emergency Department Provider Note  ____________________________________________   Event Date/Time   First MD Initiated Contact with Patient 08/23/20 1449     (approximate)  I have reviewed the triage vital signs and the nursing notes.   HISTORY  Chief Complaint Abdominal Pain, Dysuria, and Emesis   HPI Haley [name] is a 35 y.o. female with a past medical history of DM, hidradenitis, and previous UTIs who presents for assessment of some crampy generalized abdominal pain associate with constipation and some nonbloody nonbilious emesis with symptoms getting a little worse over the last for 5 days.  Patient states he has been taking over-the-counter laxatives but does not seem to help much.  She states she last had a bowel movement 4 days ago it was only a very small piece of stool.  She does not think she has been passing gas since then but has been belching a significant amount.  She denies any headache, earache, sore throat, fevers, rash or extremity pain but does endorse some nausea and states she has been throwing up anything she tries to eat.  She also endorses some burning with urination although states she was tested with a red prep and for UTI last week by her PCP who said that the studies were unremarkable.  She has had abdominal surgery in the past for abdominal wall abscess but no other significant abdominal prior pathology.  She has been taking naproxen for her pain but it seems like she has not been taking more than 2 or 3 tablets a day and has not had any significant EtOH use.  No other acute concerns at this time.         Past Medical History:  Diagnosis Date  . Diabetes mellitus   . Hidradenitis suppurativa   . Psychosis (HCC) 12/16/2018    Patient Active Problem List   Diagnosis Date Noted  . UTI (urinary tract infection) 07/27/2020  . Cigar smoker 07/13/2020  . Vaginal discharge 07/13/2020  . Lateral epicondylitis of right elbow  04/12/2020  . Insomnia 04/12/2020  . Hypoalbuminemia 02/08/2020  . Iron deficiency anemia due to chronic blood loss 02/08/2020  . Type 2 diabetes mellitus with hyperglycemia (HCC) 11/12/2019  . Hidradenitis suppurativa 11/12/2019    Past Surgical History:  Procedure Laterality Date  . LAPAROTOMY N/A 02/09/2020   Procedure: INCISION AND DRAINAGE ABDOMINAL WALL ABCESS;  Surgeon: White, Christopher M, MD;  Location: [facility] ORS;  Service: General;  Laterality: N/A;  . ROOT CANAL  05/17/2011    Prior to Admission medications   Medication Sig Start Date End Date Taking? Authorizing Provider  cephALEXin (KEFLEX) 500 MG capsule Take 1 capsule (500 mg total) by mouth 4 (four) times daily for 7 days. 08/23/20 08/30/20 Yes ,  P, MD  Accu-Chek Softclix Lancets lancets SMARTSIG:Topical 02/19/20   [provider]  blood glucose meter kit and supplies Dispense based on patient and insurance preference. Use up to four times daily as directed. (FOR ICD-10 E10.9, E11.9). 11/12/19   Cody, Jessica R, MD  blood glucose meter kit and supplies Dispense based on patient and insurance preference. Use up to four times daily as directed. (FOR ICD-10 E10.9, E11.9). 02/16/20   Thompson, Daniel V, MD  insulin glargine (LANTUS SOLOSTAR) 100 UNIT/ML Solostar Pen Inject 24 Units into the skin daily. 07/13/20   Cody, Jessica R, MD  Insulin Pen Needle 32G X 4 MM MISC 24 Units by Does not apply route at bedtime. 02/16/20   Thompson,   Daniel V, MD  metFORMIN (GLUCOPHAGE) 1000 MG tablet Take 1 tablet (1,000 mg total) by mouth in the morning and at bedtime. 07/13/20   Cody, Jessica R, MD  Naproxen Sod-diphenhydrAMINE (RA NAPROXEN SODIUM PM) 220-25 MG TABS Take 1 capsule by mouth at bedtime as needed (sleep).    [provider]  naproxen sodium (ALEVE) 220 MG tablet Take 220 mg by mouth daily as needed (pain).    [provider]  nicotine polacrilex ([employer] NICOTINE) 2 MG gum Take 1 each (2 mg total) by mouth  as needed for smoking cessation. 07/13/20   Cody, Jessica R, MD  Semaglutide,0.25 or 0.5MG/DOS, (OZEMPIC, 0.25 OR 0.5 MG/DOSE,) 2 MG/1.5ML SOPN Start with 0.25 once weekly for 4 weeks. Then increase to 0.5 once weekly. 07/13/20   Cody, Jessica R, MD    Allergies Patient has no known allergies.  Family History  Problem Relation Age of Onset  . Drug abuse Mother   . Cancer Father        unknown type  . Kidney failure Father        s/p transplant    Social History Social History   Tobacco Use  . Smoking status: Current Every Day Smoker    Years: 15.00    Types: Cigars    Last attempt to quit: 02/03/2020    Years since quitting: 0.5  . Smokeless tobacco: Never Used  . Tobacco comment: 1-2 cigars per day  Substance Use Topics  . Alcohol use: Yes    Comment: wine a few times a month  . Drug use: Not Currently    Comment: previous MJ    Review of Systems  Review of Systems  Constitutional: Negative for chills and fever.  HENT: Negative for sore throat.   Eyes: Negative for pain.  Respiratory: Negative for shortness of breath and stridor.   Cardiovascular: Negative for chest pain.  Gastrointestinal: Positive for abdominal pain, constipation, nausea and vomiting.  Genitourinary: Positive for dysuria.  Musculoskeletal: Negative for myalgias.  Skin: Negative for rash.  Neurological: Negative for seizures, loss of consciousness and headaches.  Psychiatric/Behavioral: Negative for suicidal ideas.  All other systems reviewed and are negative.     ____________________________________________   PHYSICAL EXAM:  VITAL SIGNS: ED Triage Vitals  Enc Vitals Group     BP 08/23/20 1325 107/83     Pulse Rate 08/23/20 1325 (!) 115     Resp 08/23/20 1325 (!) 22     Temp 08/23/20 1325 98.8 F (37.1 C)     Temp Source 08/23/20 1325 Oral     SpO2 08/23/20 1325 98 %     Weight 08/23/20 1325 235 lb (106.6 kg)     Height 08/23/20 1325 5' 5" (1.651 m)     Head Circumference --       Peak Flow --      Pain Score 08/23/20 1325 7     Pain Loc --      Pain Edu? --      Excl. in GC? --    Vitals:   08/23/20 1325 08/23/20 1527  BP: 107/83 118/72  Pulse: (!) 115 84  Resp: (!) 22 14  Temp: 98.8 F (37.1 C)   SpO2: 98% 100%   Physical Exam Vitals and nursing note reviewed.  Constitutional:      General: She is not in acute distress.    Appearance: She is well-developed.  HENT:     Head: Normocephalic and atraumatic.       Right Ear: External ear normal.     Left Ear: External ear normal.     Nose: Nose normal.     Mouth/Throat:     Mouth: Mucous membranes are dry.  Eyes:     Conjunctiva/sclera: Conjunctivae normal.  Cardiovascular:     Rate and Rhythm: Normal rate and regular rhythm.     Heart sounds: No murmur heard.   Pulmonary:     Effort: Pulmonary effort is normal. No respiratory distress.     Breath sounds: Normal breath sounds.  Abdominal:     Palpations: Abdomen is soft.     Tenderness: There is generalized abdominal tenderness. There is no right CVA tenderness or left CVA tenderness.  Musculoskeletal:     Cervical back: Neck supple.  Skin:    General: Skin is warm and dry.     Capillary Refill: Capillary refill takes 2 to 3 seconds.  Neurological:     Mental Status: She is alert and oriented to person, place, and time.  Psychiatric:        Mood and Affect: Mood normal.      ____________________________________________   LABS (all labs ordered are listed, but only abnormal results are displayed)  Labs Reviewed  COMPREHENSIVE METABOLIC PANEL - Abnormal; Notable for the following components:      Result Value   Glucose, Bld 239 (*)    Total Protein 8.5 (*)    All other components within normal limits  CBC - Abnormal; Notable for the following components:   WBC 11.8 (*)    All other components within normal limits  URINALYSIS, COMPLETE (UACMP) WITH MICROSCOPIC - Abnormal; Notable for the following components:   Color, Urine AMBER (*)     APPearance CLOUDY (*)    Glucose, UA 50 (*)    Ketones, ur 5 (*)    Protein, ur 100 (*)    Leukocytes,Ua LARGE (*)    WBC, UA >50 (*)    Bacteria, UA RARE (*)    All other components within normal limits  CBG MONITORING, ED - Abnormal; Notable for the following components:   Glucose-Capillary 221 (*)    All other components within normal limits  CBG MONITORING, ED - Abnormal; Notable for the following components:   Glucose-Capillary 186 (*)    All other components within normal limits  URINE CULTURE  LIPASE, BLOOD  HCG, QUANTITATIVE, PREGNANCY  LACTIC ACID, PLASMA  HEMOGLOBIN A1C   ____________________________________________  EKG  Sinus tachycardia with ventricular rate 109, normal axis, unremarkable intervals and no evidence of acute ischemia or other significant underlying arrhythmia.  ____________________________________________  RADIOLOGY  ED MD interpretation: Chest x-ray is unremarkable.  This was obtained in triage patient stated that her abdominal pain is rating to her chest at times causing her to feel little short of breath.   CT abdomen pelvis is unremarkable and shows no evidence of diverticulitis, appendicitis, cholecystitis, pancreatitis, SBO or any other clear acute abdominal pelvic pathology.  Official radiology report(s): DG Chest 2 View  Result Date: 08/23/2020 CLINICAL DATA:  Shortness of breath EXAM: CHEST - 2 VIEW COMPARISON:  None. FINDINGS: The heart size and mediastinal contours are within normal limits. Both lungs are clear. The visualized skeletal structures are unremarkable. IMPRESSION: No active cardiopulmonary disease. Electronically Signed   By: Hetal  Patel   On: 08/23/2020 14:30   CT ABDOMEN PELVIS W CONTRAST  Result Date: 08/23/2020 CLINICAL DATA:  Abdomen pain EXAM: CT ABDOMEN AND PELVIS WITH CONTRAST TECHNIQUE: Multidetector CT imaging of   the abdomen and pelvis was performed using the standard protocol following bolus administration of  intravenous contrast. CONTRAST:  122m OMNIPAQUE IOHEXOL 300 MG/ML  SOLN COMPARISON:  CT 02/08/2020 FINDINGS: Lower chest: No acute abnormality. Hepatobiliary: No focal liver abnormality is seen. No gallstones, gallbladder wall thickening, or biliary dilatation. Pancreas: Unremarkable. No pancreatic ductal dilatation or surrounding inflammatory changes. Spleen: Normal in size without focal abnormality. Adrenals/Urinary Tract: Adrenal glands are unremarkable. Kidneys are normal, without renal calculi, focal lesion, or hydronephrosis. Bladder is unremarkable. Stomach/Bowel: Stomach is within normal limits. Appendix not well seen but no right lower quadrant inflammatory process. No evidence of bowel wall thickening, distention, or inflammatory changes. Vascular/Lymphatic: No significant vascular findings are present. No enlarged abdominal or pelvic lymph nodes. Reproductive: Uterus and bilateral adnexa are unremarkable. Other: No abdominal wall hernia or abnormality. No abdominopelvic ascites. Musculoskeletal: No acute or significant osseous findings. IMPRESSION: Negative. No CT evidence for acute intra-abdominal or pelvic abnormality. Electronically Signed   By: KDonavan FoilM.D.   On: 08/23/2020 15:59    ____________________________________________   PROCEDURES  Procedure(s) performed (including Critical Care):  .1-3 Lead EKG Interpretation Performed by: SLucrezia Starch MD Authorized by: SLucrezia Starch MD     Interpretation: normal     ECG rate assessment: normal     Rhythm: sinus rhythm     Ectopy: none     Conduction: normal       ____________________________________________   INITIAL IMPRESSION / ASSESSMENT AND PLAN / ED COURSE     Patient presents with above history exam for some crampy abdominal pain associate with burning with urination constipation and some nonbloody nonbilious emesis.  On arrival she is fully tachycardic at 115 and tachypneic at 22 with otherwise stable  vital signs.  Blood seems she was complaining a little bit of shortness of breath in triage and later denied this and clarified to me that it was from the pain in her abdomen seemingly causing her to feel short of breath.  Chest x-ray is unremarkable and Evalose patient for acute any significant intrathoracic pathology at this time.  With regard to her abdominal and urinary symptoms initial differential includes SBO, diverticulitis, ileus, constipation, UTI, pyelonephritis, cholecystitis and pancreatitis.  CT obtained shows no evidence of intra-abdominal pelvic pathology.  Patient's abdomen is actually fairly soft nontender throughout she is noted to have significant stool burden on her CT.  Suspect she is likely suffering from significant constipation given has not had a bowel movement in a couple days and reports she is vomiting and was tries eat solid food.  Discussed strong bowel regiment with initiation of Senokot and MiraLAX bowel cleanout regimen.  Also advised she did use some enemas that she can pick up over-the-counter.  CMP shows a glucose of 239 consistent patient's history of diabetes and on further questioning it seems she has not been taking her diabetes medicines as directed.  He otherwise has no significant metabolic or electrolyte derangements.  Advised her to continue taking all of her diabetes medicines as directed.  Pregnancy test is negative and lipase is 28 not consistent with pancreatitis.  CBC shows leukocytosis at 11.8 with no acute anemia or abnormal platelets.  UA is concerning for possible cystitis with large LE S and greater than 50 WBCs and rare bacteria.  Urine culture sent and patient given a dose of Rocephin emergency room.  She was also given some fluids for some mild dehydration.  In addition to all regiment will write course of  Keflex.  Advised patient to follow-up with PCP.  Discharge stable condition.       ____________________________________________   FINAL  CLINICAL IMPRESSION(S) / ED DIAGNOSES  Final diagnoses:  Acute cystitis without hematuria  Constipation, unspecified constipation type  Dehydration  Hyperglycemia    Medications  insulin aspart (novoLOG) injection 0-15 Units (3 Units Subcutaneous Given 08/23/20 1621)  cefTRIAXone (ROCEPHIN) 1 g in sodium chloride 0.9 % 100 mL IVPB (1 g Intravenous New Bag/Given 08/23/20 1627)  senna-docusate (Senokot-S) tablet 2 tablet (has no administration in time range)  lactated ringers bolus 1,000 mL (0 mLs Intravenous Paused 08/23/20 1624)  iohexol (OMNIPAQUE) 300 MG/ML solution 100 mL (100 mLs Intravenous Contrast Given 08/23/20 1538)     ED Discharge Orders         Ordered    cephALEXin (KEFLEX) 500 MG capsule  4 times daily        08/23/20 1625           Note:  This document was prepared using Dragon voice recognition software and may include unintentional dictation errors.   Lucrezia Starch, MD 08/23/20 [phone]

## 2020-08-23 NOTE — Discharge Instructions (Signed)
BOWEL CLEAN OUT INSTRUCTIONS Mix the entire 238-gram bottle of MiraLAX with 64 ounces of a sports drink. Drink all of the mixture over the next few hours until gone. (Suggestion: An 8-ounce glass every 15-30 minutes equals 2-4 hours.) It is very important to drink plenty of water and other liquids in order to avoid dehydration and to flush the bowel. (Although alcohol is a liquid, it can make you dehydrated. You should NOT drink alcohol while doing the cleanout.) NOTE: Please stay home once you have started your cleanout. Also, the use of moist towelettes or wipes may help to minimize discomfort during the cleanout. A nonprescription 1% hydrocortisone cream may also be soothing when applied to the rectal area after each bowel movement. It is common during the cleanout to experience some nausea, bloating, and/or abdominal distention. If you chilled the mixture prior to drinking it, you could experience chills from consuming so much cold liquid in a short time period. If you develop nausea or vomiting, slow down the rate at which you drink the solution. Please attempt to drink all of the laxative solution even if it takes you longer. Once stooling slows down, you may resume eating solid food.

## 2020-08-23 NOTE — ED Triage Notes (Signed)
Pt to ED via POV with c/o constipation, lower back pain, emesis with eat/drinking. Pt states took a laxative which took over 24 hrs to work but "it was hardly anything". Pt A&O x4. Pt states recently seen at PCP and tested for UTI which was negative. Pt also c/o mild SOB.   Recently Spironolactone for her skin and Ozympic for DM.   Pt A&O x4, ambulatory without difficulty, able to speak in full and complete sentences at this time.

## 2020-08-24 ENCOUNTER — Ambulatory Visit: Payer: BC Managed Care – PPO | Admitting: Family Medicine

## 2020-08-25 LAB — URINE CULTURE

## 2020-09-07 ENCOUNTER — Ambulatory Visit: Payer: BC Managed Care – PPO | Admitting: Dietician

## 2020-10-05 DIAGNOSIS — L700 Acne vulgaris: Secondary | ICD-10-CM | POA: Diagnosis not present

## 2020-10-05 DIAGNOSIS — L732 Hidradenitis suppurativa: Secondary | ICD-10-CM | POA: Diagnosis not present

## 2020-10-05 DIAGNOSIS — K130 Diseases of lips: Secondary | ICD-10-CM | POA: Diagnosis not present

## 2020-11-04 ENCOUNTER — Other Ambulatory Visit: Payer: Self-pay | Admitting: *Deleted

## 2020-11-04 DIAGNOSIS — E1165 Type 2 diabetes mellitus with hyperglycemia: Secondary | ICD-10-CM

## 2020-11-04 MED ORDER — ACCU-CHEK GUIDE VI STRP: ORAL_STRIP | 11 refills | Status: DC

## 2020-11-08 ENCOUNTER — Other Ambulatory Visit: Payer: Self-pay | Admitting: Family Medicine

## 2020-11-08 ENCOUNTER — Other Ambulatory Visit: Payer: Self-pay

## 2020-11-08 DIAGNOSIS — E119 Type 2 diabetes mellitus without complications: Secondary | ICD-10-CM

## 2020-11-08 MED ORDER — ACCU-CHEK SOFTCLIX LANCETS MISC: 1.0000 | Freq: Four times a day (QID) | 2 refills | Status: DC

## 2020-11-16 ENCOUNTER — Telehealth: Payer: Self-pay

## 2020-11-16 NOTE — Telephone Encounter (Signed)
[facility] Day - Client TELEPHONE ADVICE RECORD AccessNurse Patient Name: Haley [name] [facility] Gender: Female DOB: 1986-04-14 Age: 35 Y 5 M 7 D Return Phone Number: [phone] (Primary) Address: City/ State/ Zip: [place] [address] Client [facility] - Client Client Site [facility] - Day Physician Nicki Reaper- NP Contact Type Call Who Is Calling Patient / Member / Family / Caregiver Call Type Triage / Clinical Relationship To Patient Self Return Phone Number [phone] (Primary) Chief Complaint BREATHING - shortness of breath or sounds breathless Reason for Call Symptomatic / Request for Health Information Initial Comment Caller states she has SOB and congestion. She has eye pain and congestion as well. GOTO Facility Not Listed [place] ED Translation No Nurse Assessment Nurse: Lovelace, RN, Amy Date/Time Lamount Cohen Time): 11/16/2020 12:37:38 PM Confirm and document reason for call. If symptomatic, describe symptoms. ---Caller states she has SOB and congestion. She has eye pain and congestion as well. It started Monday. She is coughing a lot. She is SOB at rest. No fever. Does the patient have any new or worsening symptoms? ---Yes Will a triage be completed? ---Yes Related visit to physician within the last 2 weeks? ---No Does the PT have any chronic conditions? (i.e. diabetes, asthma, this includes High risk factors for pregnancy, etc.) ---Yes List chronic conditions. ---DM Is the patient pregnant or possibly pregnant? (Ask all females between the ages of 77-55) ---No Is this a behavioral health or substance abuse call? ---No Guidelines Guideline Title Affirmed Question Affirmed Notes Nurse Date/Time (Eastern Time) Breathing Difficulty [1] MODERATE difficulty breathing (e.g., speaks in phrases, SOB even at rest, pulse 100-120) AND [2] NEW-onset Lovelace, RN, Amy 11/16/2020  12:39:09 PM PLEASE NOTE: All timestamps contained within this report are represented as [place] Standard Time. CONFIDENTIALTY NOTICE: This fax transmission is intended only for the addressee. It contains information that is legally privileged, confidential or otherwise protected from use or disclosure. If you are not the intended recipient, you are strictly prohibited from reviewing, disclosing, copying using or disseminating any of this information or taking any action in reliance on or regarding this information. If you have received this fax in error, please notify us immediately by telephone so that we can arrange for its return to [place]. Phone: [phone], Toll-Free: [phone], Fax: [phone] Page: 2 of 2 Call Id: [id number] Guidelines Guideline Title Affirmed Question Affirmed Notes Nurse Date/Time Lamount Cohen Time) or WORSE than normal Disp. Time Lamount Cohen Time) Disposition Final User 11/16/2020 12:32:29 PM Send to Urgent Queue [place] [facility] 11/16/2020 12:41:44 PM Go to ED Now Yes Lovelace, RN, Amy Caller Disagree/Comply Comply Caller Understands Yes PreDisposition InappropriateToAsk Care Advice Given Per Guideline GO TO ED NOW: * You need to be seen in the Emergency Department. * Go to the ED at ___________ Hospital. * Leave now. Drive carefully. NOTE TO TRIAGER - DRIVING: * Another adult should drive. BRING MEDICINES: * Bring a list of your current medicines when you go to the Emergency Department (ER). * Bring the pill bottles too. This will help the doctor (or NP/ PA) to make certain you are taking the right medicines and the right dose. CARE ADVICE given per Breathing Difficulty (Adult) guideline. Referrals GO TO FACILITY OTHER - SPECIFY

## 2020-11-16 NOTE — Telephone Encounter (Signed)
I spoke with Haley [name]; Haley [name] is SOB even with rest. Pts symptoms started on 11/14/20 and today has cough, eye pain and SOB; Haley [name] is speaking in sentences but is having to stop slightly while talking. Haley [name] is a diabetic.Haley [name] is not having any CP. If Haley [name] cannot be seen at [facility] today Haley [name] is going to [facility] ED.no available appt at [facility] and Haley [name] should have an eval for someone to listen to lungs and possibly covid tested. Haley [name] said she covid tested when she got home on 11/14/20 which was neg. Sending note to Dr Selena Batten as Lorain Childes.

## 2020-11-17 ENCOUNTER — Other Ambulatory Visit: Payer: Self-pay

## 2020-11-17 MED ORDER — GLUCOSE BLOOD VI STRP: ORAL_STRIP | 3 refills | Status: DC

## 2020-11-17 NOTE — Telephone Encounter (Signed)
Agree with plan for evaluation

## 2020-12-15 ENCOUNTER — Other Ambulatory Visit: Payer: Self-pay

## 2020-12-15 ENCOUNTER — Other Ambulatory Visit (HOSPITAL_COMMUNITY)
Admission: RE | Admit: 2020-12-15 | Discharge: 2020-12-15 | Disposition: A | Discharge status: Home / Self Care | Payer: BC Managed Care – PPO | Source: Ambulatory Visit | Attending: Family Medicine | Admitting: Family Medicine

## 2020-12-15 ENCOUNTER — Ambulatory Visit (INDEPENDENT_AMBULATORY_CARE_PROVIDER_SITE_OTHER): Payer: BC Managed Care – PPO | Admitting: Family Medicine

## 2020-12-15 VITALS — BP 118/64 | HR 103 | Temp 97.8°F | Ht 65.0 in | Wt 226.0 lb

## 2020-12-15 DIAGNOSIS — R102 Pelvic and perineal pain unspecified side: Secondary | ICD-10-CM | POA: Insufficient documentation

## 2020-12-15 DIAGNOSIS — Z1151 Encounter for screening for human papillomavirus (HPV): Secondary | ICD-10-CM | POA: Diagnosis not present

## 2020-12-15 DIAGNOSIS — E1165 Type 2 diabetes mellitus with hyperglycemia: Secondary | ICD-10-CM | POA: Diagnosis not present

## 2020-12-15 DIAGNOSIS — Z01411 Encounter for gynecological examination (general) (routine) with abnormal findings: Secondary | ICD-10-CM | POA: Diagnosis not present

## 2020-12-15 DIAGNOSIS — B351 Tinea unguium: Secondary | ICD-10-CM | POA: Diagnosis not present

## 2020-12-15 DIAGNOSIS — N898 Other specified noninflammatory disorders of vagina: Secondary | ICD-10-CM

## 2020-12-15 DIAGNOSIS — F411 Generalized anxiety disorder: Secondary | ICD-10-CM | POA: Diagnosis not present

## 2020-12-15 DIAGNOSIS — R41840 Attention and concentration deficit: Secondary | ICD-10-CM

## 2020-12-15 DIAGNOSIS — Z792 Long term (current) use of antibiotics: Secondary | ICD-10-CM | POA: Insufficient documentation

## 2020-12-15 DIAGNOSIS — Z124 Encounter for screening for malignant neoplasm of cervix: Secondary | ICD-10-CM

## 2020-12-15 MED ORDER — BUPROPION HCL ER (XL) 150 MG PO TB24: 150.0000 mg | ORAL_TABLET | Freq: Every day | ORAL | 1 refills | Status: DC

## 2020-12-15 MED ORDER — LANTUS SOLOSTAR 100 UNIT/ML ~~LOC~~ SOPN: 24.0000 [IU] | PEN_INJECTOR | Freq: Every day | SUBCUTANEOUS | 3 refills | Status: DC

## 2020-12-15 MED ORDER — EFINACONAZOLE 10 % EX SOLN
1.0000 | Freq: Every day | CUTANEOUS | 1 refills | Status: DC
Start: 2020-12-15 — End: 2020-12-20

## 2020-12-15 NOTE — Assessment & Plan Note (Signed)
Pt notes recurrent treatment for yeast infection - though no recent positive testing and generally in setting of abx use. Given diabetes and abx likely contributing. Some white discharge on exam but pt notes not worse than her typical discharge. Will sent for yeast/BV to evaluate.

## 2020-12-15 NOTE — Progress Notes (Signed)
Subjective:     Haley [name] is a 35 y.o. female presenting for Gynecologic Exam, Nail Problem (R thumb ), Back Pain (Lower x 3 days ), trouble focusing (Never been diagnosed with ADHD but feels like she has it and wants to be tested for it. ), and GI Problem (Reports that she vomits at least 2x a week )     Gynecologic Exam The patient's primary symptoms include pelvic pain and vaginal discharge. Associated symptoms include back pain. Pertinent negatives include no constipation, diarrhea, dysuria, frequency, hematuria or urgency.  Back Pain Associated symptoms include pelvic pain. Pertinent negatives include no dysuria.  GI Problem Primary symptoms do not include diarrhea or dysuria.  The illness is also significant for back pain. The illness does not include constipation.   #Pelvic symptoms - notes recurrent yeast infections - has also been on antibiotics - notes a pressure sensation - this on and off since March - no dysuria - endorses normal vaginal discharge - when she has a yeast infection - will become clumpy - does have itching sensation - but recently shaved  #Diabetes - ran out of lantus  - continues to take metformin 1000 mg bid - is no longer taking ozempic  #Nail  - raised - white discharge - pulling away - yellow discoloration x 1 month  #Issues paying attention - difficulty in conversation with people - easily distracted - difficulty finishing tasks - no hx of adhd - did have issues paying attention in HS and college - grades in HS were not got - except for music and social studies - endorses anxiety and this impacts ability to pay attention  Review of Systems  Gastrointestinal:  Negative for constipation and diarrhea.  Genitourinary:  Positive for pelvic pain and vaginal discharge. Negative for difficulty urinating, dysuria, frequency, hematuria and urgency.  Musculoskeletal:  Positive for back pain.    Social History   Tobacco Use  Smoking  Status Every Day   Types: Cigars   Last attempt to quit: 02/03/2020   Years since quitting: 0.8  Smokeless Tobacco Never  Tobacco Comments   1-2 cigars per day        Objective:    BP Readings from Last 3 Encounters:  12/15/20 118/64  08/23/20 114/75  07/27/20 118/68   Wt Readings from Last 3 Encounters:  12/15/20 226 lb (102.5 kg)  08/23/20 235 lb (106.6 kg)  07/27/20 236 lb 3 oz (107.1 kg)    BP 118/64   Pulse (!) 103   Temp 97.8 F (36.6 C) (Temporal)   Ht 5\' 5"  (1.651 m)   Wt 226 lb (102.5 kg)   SpO2 99%   BMI 37.61 kg/m    Physical Exam Exam conducted with a chaperone present.  Constitutional:      General: She is not in acute distress.    Appearance: She is well-developed. She is not diaphoretic.  HENT:     Right Ear: External ear normal.     Left Ear: External ear normal.  Eyes:     Conjunctiva/sclera: Conjunctivae normal.  Cardiovascular:     Rate and Rhythm: Normal rate.  Pulmonary:     Effort: Pulmonary effort is normal.  Genitourinary:    Pubic Area: No rash.      Vagina: Vaginal discharge (white chunky) present.     Cervix: No cervical motion tenderness or erythema.     Uterus: Normal.      Adnexa: Right adnexa normal.  Musculoskeletal:  Cervical back: Neck supple.  Skin:    General: Skin is warm and dry.     Capillary Refill: Capillary refill takes less than 2 seconds.     Comments: Left thumb nail with discoloration - yellow to green, raised from the bed.   Neurological:     Mental Status: She is alert. Mental status is at baseline.  Psychiatric:        Mood and Affect: Mood normal.        Behavior: Behavior normal.    Adult ADHD Self Report Scale (most recent)     Adult ADHD Self-Report Scale (ASRS-v1.1) Symptom Checklist - 12/15/20 1142       Part A   1. How often do you have trouble wrapping up the final details of a project, once the challenging parts have been done? Often  2. How often do you have difficulty getting things  done in order when you have to do a task that requires organization? Sometimes    3. How often do you have problems remembering appointments or obligations? Sometimes  4. When you have a task that requires a lot of thought, how often do you avoid or delay getting started? Very Often    5. How often do you fidget or squirm with your hands or feet when you have to sit down for a long time? Rarely  6. How often do you feel overly active and compelled to do things, like you were driven by a motor? Never      Part B   7. How often do you make careless mistakes when you have to work on a boring or difficult project? Sometimes  8. How often do you have difficulty keeping your attention when you are doing boring or repetitive work? Very Often    9. How often do you have difficulty concentrating on what people say to you, even when they are speaking to you directly? Often  10. How often do you misplace or have difficulty finding things at home or at work? Sometimes    11. How often are you distracted by activity or noise around you? Very Often  12. How often do you leave your seat in meetings or other situations in which you are expected to remain seated? Often    13. How often do you feel restless or fidgety? Very Often  14. How often do you have difficulty unwinding and relaxing when you have time to yourself? Never    15. How often do you find yourself talking too much when you are in social situations? Sometimes  16. When you are in a conversation, how often do you find yourself finishing the sentences of the people you are talking to, before they can finish them themselves? Very Often    17. How often do you have difficulty waiting your turn in situations when turn taking is required? Very Often  18. How often do you interrupt others when they are busy? Sometimes                  Assessment & Plan:   Problem List Items Addressed This Visit       Endocrine   Type 2 diabetes mellitus with  hyperglycemia (HCC)    Poorly controlled will get hgb a1c at next visit. Cont metformin 1000 mg BID and lantus 24 units daily. She is concerned ozempic impacting vision - hold ozempic. Advised eye exam to assess for diabetic retinopathy.  Relevant Medications   insulin glargine (LANTUS SOLOSTAR) 100 UNIT/ML Solostar Pen     Musculoskeletal and Integument   Onychomycosis - Primary    Start topical treatment. Discussed nail may come off given how raised it is.        Relevant Medications   Efinaconazole 10 % SOLN     Other   Vaginal discharge    Pt notes recurrent treatment for yeast infection - though no recent positive testing and generally in setting of abx use. Given diabetes and abx likely contributing. Some white discharge on exam but pt notes not worse than her typical discharge. Will sent for yeast/BV to evaluate.        Attention deficit    No prior diagnosis of ADHD but questionnaire elevated. Also with anxiety. Discussed starting welbutrin and psych referral for additional diagnosis/treatment support if welbutrin does not help.        Relevant Medications   buPROPion (WELLBUTRIN XL) 150 MG 24 hr tablet   Other Relevant Orders   Ambulatory referral to Psychiatry   Generalized anxiety disorder    Start welbutrin 150 mg daily.        Relevant Medications   buPROPion (WELLBUTRIN XL) 150 MG 24 hr tablet   Other Relevant Orders   Ambulatory referral to Psychiatry   Pelvic pressure in female    Exam and hx (no abnormal bleeding, urinary symptoms) reassuring. Advised kegels and if not improving will consider GI consult vs [place]       Other Visit Diagnoses     Cervical cancer screening       Relevant Orders   Cytology - PAP        Return in about 6 weeks (around 01/26/2021) for Diabetes and Attention/anxiety f/u.  Lynnda Child, MD  This visit occurred during the SARS-CoV-2 public health emergency.  Safety protocols were in place, including screening  questions prior to the visit, additional usage of staff PPE, and extensive cleaning of exam room while observing appropriate contact time as indicated for disinfecting solutions.

## 2020-12-15 NOTE — Assessment & Plan Note (Signed)
Start welbutrin 150 mg daily.

## 2020-12-15 NOTE — Assessment & Plan Note (Signed)
Exam and hx (no abnormal bleeding, urinary symptoms) reassuring. Advised kegels and if not improving will consider GI consult vs [place]

## 2020-12-15 NOTE — Assessment & Plan Note (Signed)
Poorly controlled will get hgb a1c at next visit. Cont metformin 1000 mg BID and lantus 24 units daily. She is concerned ozempic impacting vision - hold ozempic. Advised eye exam to assess for diabetic retinopathy.

## 2020-12-15 NOTE — Assessment & Plan Note (Signed)
Start topical treatment. Discussed nail may come off given how raised it is.

## 2020-12-15 NOTE — Patient Instructions (Signed)
#  nail - fungal infection - topical nail polish - treat for 48 weeks, ok to cut away nail as well  #ADHD/Anxiety - start Welbutin - return 6-8 weeks  #Try doing Kegel exercises - 10 kegels work to hold for 10 seconds each - 3 times per day

## 2020-12-15 NOTE — Assessment & Plan Note (Signed)
No prior diagnosis of ADHD but questionnaire elevated. Also with anxiety. Discussed starting welbutrin and psych referral for additional diagnosis/treatment support if welbutrin does not help.

## 2020-12-16 LAB — CYTOLOGY - PAP
Chlamydia: NEGATIVE
Comment: NEGATIVE
Comment: NEGATIVE
Comment: NEGATIVE
Comment: NORMAL
Diagnosis: NEGATIVE
High risk HPV: NEGATIVE
Neisseria Gonorrhea: NEGATIVE
Trichomonas: NEGATIVE

## 2020-12-20 ENCOUNTER — Telehealth: Payer: Self-pay

## 2020-12-20 DIAGNOSIS — B351 Tinea unguium: Secondary | ICD-10-CM

## 2020-12-20 MED ORDER — CICLOPIROX 8 % EX SOLN: Freq: Every day | CUTANEOUS | 1 refills | Status: DC

## 2020-12-20 NOTE — Addendum Note (Signed)
Addended by: Lynnda Child on: 12/20/2020 09:35 AM   Modules accepted: Orders

## 2020-12-20 NOTE — Telephone Encounter (Signed)
Dr Selena Batten, Efinaconazole 10 % SOLN requires PA but the following do not: Ciclodan, Ciclopirox Olamine, Clotrimazole, Fluconazole, Tavaborole, and Terbinafine HCL.  Do you want to change medication or proceed with PA?  Key: [facility]

## 2020-12-20 NOTE — Telephone Encounter (Signed)
Will send in alternative to try.

## 2021-03-27 ENCOUNTER — Encounter (HOSPITAL_BASED_OUTPATIENT_CLINIC_OR_DEPARTMENT_OTHER): Payer: Self-pay | Admitting: Emergency Medicine

## 2021-03-27 ENCOUNTER — Other Ambulatory Visit: Payer: Self-pay

## 2021-03-27 ENCOUNTER — Emergency Department (HOSPITAL_BASED_OUTPATIENT_CLINIC_OR_DEPARTMENT_OTHER)
Admission: EM | Admit: 2021-03-27 | Discharge: 2021-03-27 | Disposition: A | Discharge status: Home / Self Care | Payer: Medicaid - Out of State | Attending: Student | Admitting: Student

## 2021-03-27 DIAGNOSIS — Z794 Long term (current) use of insulin: Secondary | ICD-10-CM | POA: Diagnosis not present

## 2021-03-27 DIAGNOSIS — F17290 Nicotine dependence, other tobacco product, uncomplicated: Secondary | ICD-10-CM | POA: Insufficient documentation

## 2021-03-27 DIAGNOSIS — Z7984 Long term (current) use of oral hypoglycemic drugs: Secondary | ICD-10-CM | POA: Insufficient documentation

## 2021-03-27 DIAGNOSIS — E119 Type 2 diabetes mellitus without complications: Secondary | ICD-10-CM | POA: Insufficient documentation

## 2021-03-27 DIAGNOSIS — L02212 Cutaneous abscess of back [any part, except buttock]: Secondary | ICD-10-CM | POA: Insufficient documentation

## 2021-03-27 DIAGNOSIS — L0291 Cutaneous abscess, unspecified: Secondary | ICD-10-CM

## 2021-03-27 MED ORDER — DOXYCYCLINE HYCLATE 100 MG PO CAPS: 100.0000 mg | ORAL_CAPSULE | Freq: Two times a day (BID) | ORAL | 0 refills | Status: AC

## 2021-03-27 MED ORDER — LIDOCAINE-EPINEPHRINE (PF) 2 %-1:200000 IJ SOLN
10.0000 mL | Freq: Once | INTRAMUSCULAR | Status: DC
  Filled 2021-03-27: qty 20

## 2021-03-27 NOTE — ED Triage Notes (Signed)
Pt c/o abscess to left back, reports she is taking Bactrim prescribed by PCP that was started on Thursday

## 2021-03-27 NOTE — Discharge Instructions (Addendum)
Please return to the ED or follow up with your PCP in 48 hours for packing removal/wound recheck  Pick up antibiotics and take as prescribed. Discontinue use of the Bactrim prescribed by your dermatologist  Return to the ED for any new/worsening symptoms

## 2021-03-27 NOTE — ED Provider Notes (Signed)
[facility] Provider Note   CSN: [id number] Arrival date & time: 03/27/21  1651     History Chief Complaint  Patient presents with   Abscess    Haley [name] is a 35 y.o. female with PMHx Diabetes and Hidraadenitis suppurativa who presents to the ED today with complaint of abscess to left back x 1 week.  Patient reports she was seen by her dermatologist for same.  She was placed on Bactrim which she has been taking.  She has also been applying over-the-counter creams and home remedies to the abscess without significant relief.  She did take naproxen earlier today which helped her pain significantly.  She has not noticed any drainage on her close.  She denies any fevers or chills. No other complaints at this time.   The history is provided by the patient and medical records.      Past Medical History:  Diagnosis Date   Diabetes mellitus    Hidradenitis suppurativa    Psychosis ([place]) 12/16/2018    Patient Active Problem List   Diagnosis Date Noted   Onychomycosis 12/15/2020   Attention deficit 12/15/2020   Generalized anxiety disorder 12/15/2020   Pelvic pressure in female 12/15/2020   Cigar smoker 07/13/2020   Vaginal discharge 07/13/2020   Lateral epicondylitis of right elbow 04/12/2020   Insomnia 04/12/2020   Hypoalbuminemia 02/08/2020   Iron deficiency anemia due to chronic blood loss 02/08/2020   Type 2 diabetes mellitus with hyperglycemia ([place]) 11/12/2019   Hidradenitis suppurativa 11/12/2019    Past Surgical History:  Procedure Laterality Date   LAPAROTOMY N/A 02/09/2020   Procedure: INCISION AND DRAINAGE ABDOMINAL WALL ABCESS;  Surgeon: Ileana Roup, MD;  Location: [facility];  Service: General;  Laterality: N/A;   ROOT CANAL  05/17/2011     OB History   No obstetric history on file.     Family History  Problem Relation Age of Onset   Drug abuse Mother    Cancer Father        unknown type   Kidney failure Father         s/p transplant    Social History   Tobacco Use   Smoking status: Every Day    Types: Cigars    Last attempt to quit: 02/03/2020    Years since quitting: 1.1   Smokeless tobacco: Never   Tobacco comments:    1-2 cigars per day  Substance Use Topics   Alcohol use: Yes    Comment: wine a few times a month   Drug use: Not Currently    Comment: previous [place] Medications Prior to Admission medications   Medication Sig Start Date End Date Taking? Authorizing Provider  doxycycline (VIBRAMYCIN) 100 MG capsule Take 1 capsule (100 mg total) by mouth 2 (two) times daily for 7 days. 03/27/21 04/03/21 Yes Jamesen Stahnke, PA-C  Accu-Chek Softclix Lancets lancets 1 each by Other route QID. Use as instructed to check blood sugar up to 4 times a day. 11/08/20   Lesleigh Noe, MD  blood glucose meter kit and supplies Dispense based on patient and insurance preference. Use up to four times daily as directed. (FOR ICD-10 E10.9, E11.9). 11/12/19   Lesleigh Noe, MD  buPROPion (WELLBUTRIN XL) 150 MG 24 hr tablet Take 1 tablet (150 mg total) by mouth daily. 12/15/20   Lesleigh Noe, MD  ciclopirox (PENLAC) 8 % solution Apply topically at bedtime. Apply over nail and surrounding  skin. Apply daily over previous coat. After seven (7) days, may remove with alcohol and continue cycle. 12/20/20   Lesleigh Noe, MD  glucose blood test strip Use to test blood sugar up to 4 times a day 11/17/20   Lesleigh Noe, MD  HYDROcodone-acetaminophen (NORCO/VICODIN) 5-325 MG tablet Take 1 tablet by mouth every 4 (four) hours as needed. 12/01/20   [provider]  insulin glargine (LANTUS SOLOSTAR) 100 UNIT/ML Solostar Pen Inject 24 Units into the skin daily. 12/15/20   Lesleigh Noe, MD  Insulin Pen Needle 32G X 4 MM MISC 24 Units by Does not apply route at bedtime. 02/16/20   Eugenie Filler, MD  metFORMIN (GLUCOPHAGE) 1000 MG tablet Take 1 tablet (1,000 mg total) by mouth in the morning and at bedtime.  07/13/20   Lesleigh Noe, MD  naproxen sodium (ALEVE) 220 MG tablet Take 220 mg by mouth daily as needed (pain).    [provider]  nicotine polacrilex ([employer] NICOTINE) 2 MG gum Take 1 each (2 mg total) by mouth as needed for smoking cessation. 07/13/20   Lesleigh Noe, MD  OZEMPIC, 0.25 OR 0.5 MG/DOSE, 2 MG/1.5ML SOPN START WITH 0.25 ONCE WEEKLY FOR 4 WEEKS. THEN INCREASE TO 0.5 ONCE WEEKLY. 11/09/20   Lesleigh Noe, MD    Allergies    Patient has no known allergies.  Review of Systems   Review of Systems  Constitutional:  Negative for chills and fever.  Skin:        + abscess   All other systems reviewed and are negative.  Physical Exam Updated Vital Signs BP 120/74 (BP Location: Left Arm)   Pulse (!) 113   Temp 98.7 F (37.1 C) (Oral)   Resp 19   Ht _0  (1.651 m)   Wt 104.3 kg   LMP 03/10/2021 (Approximate)   SpO2 99%   BMI 38.27 kg/m   Physical Exam Vitals and nursing note reviewed.  Constitutional:      Appearance: She is not ill-appearing.  HENT:     Head: Normocephalic and atraumatic.  Eyes:     Conjunctiva/sclera: Conjunctivae normal.  Cardiovascular:     Rate and Rhythm: Normal rate and regular rhythm.  Pulmonary:     Effort: Pulmonary effort is normal.     Breath sounds: Normal breath sounds.  Abdominal:     Palpations: Abdomen is soft.     Tenderness: There is no abdominal tenderness.  Musculoskeletal:     Cervical back: Neck supple.  Skin:    General: Skin is warm and dry.     Comments: 3 x 2 cm area of erythema, edema, and fluctuance to left back skin crease  Neurological:     Mental Status: She is alert.    ED Results / Procedures / Treatments   Labs (all labs ordered are listed, but only abnormal results are displayed) Labs Reviewed - No data to display  EKG None  Radiology No results found.  Procedures .Marland KitchenIncision and Drainage  Date/Time: 03/27/2021 8:21 PM Performed by: Eustaquio Maize, PA-C Authorized by: Eustaquio Maize, PA-C   Consent:    Consent obtained:  Verbal   Consent given by:  Patient   Risks discussed:  Bleeding, incomplete drainage, pain and damage to other organs   Alternatives discussed:  No treatment Universal protocol:    Procedure explained and questions answered to patient or proxy's satisfaction: yes     Relevant documents present and verified: yes  Test results available : yes     Imaging studies available: yes     Required blood products, implants, devices, and special equipment available: yes     Site/side marked: yes     Immediately prior to procedure, a time out was called: yes     Patient identity confirmed:  Verbally with patient Location:    Type:  Abscess   Size:  3 x 2 cm   Location:  Trunk   Trunk location:  Back Pre-procedure details:    Skin preparation:  Betadine Anesthesia:    Anesthesia method:  Local infiltration   Local anesthetic:  Lidocaine 2% WITH epi Procedure type:    Complexity:  Complex Procedure details:    Incision types:  Single straight   Incision depth:  Subcutaneous   Wound management:  Probed and deloculated, irrigated with saline and extensive cleaning   Drainage:  Purulent   Drainage amount:  Moderate   Packing materials:  1/4 in gauze Post-procedure details:    Procedure completion:  Tolerated well, no immediate complications   Medications Ordered in ED Medications  lidocaine-EPINEPHrine (XYLOCAINE W/EPI) 2 %-1:200000 (PF) injection 10 mL (has no administration in time range)    ED Course  I have reviewed the triage vital signs and the nursing notes.  Pertinent labs & imaging results that were available during my care of the patient were reviewed by me and considered in my medical decision making (see chart for details).    MDM Rules/Calculators/A&P                           35 year old female who presents to the ED today with complaint of abscess to left mid back for the past week.  No improvement with oral Bactrim.   On arrival to the ED she is noted to be tachycardic at 113. Suspect tachy s/2 pain.  Remainder vitals are unremarkable.  On exam she appears to be no acute distress.  She does have a 3 x 2 cm area of fluctuance to the left back along a skin crease.  Will require I&D at this time.  We will plan to discharge patient with different antibiotic.  I&D performed and packing placed. Pt discharge with doxycycline. She is instructed to return to the ED or follow up with PCP in 48 hours for packing removal. She is in agreement with plan and stable for discharge home.   This note was prepared using Dragon voice recognition software and may include unintentional dictation errors due to the inherent limitations of voice recognition software.   Final Clinical Impression(s) / ED Diagnoses Final diagnoses:  Abscess    Rx / DC Orders ED Discharge Orders          Ordered    doxycycline (VIBRAMYCIN) 100 MG capsule  2 times daily        03/27/21 2023             Discharge Instructions      Please return to the ED or follow up with your PCP in 48 hours for packing removal/wound recheck  Pick up antibiotics and take as prescribed. Discontinue use of the Bactrim prescribed by your dermatologist  Return to the ED for any new/worsening symptoms       Eustaquio Maize, Hershal Coria 03/27/21 2024    Teressa Lower, MD 03/27/21 2338

## 2021-04-17 ENCOUNTER — Other Ambulatory Visit: Payer: Self-pay

## 2021-04-17 ENCOUNTER — Telehealth (INDEPENDENT_AMBULATORY_CARE_PROVIDER_SITE_OTHER): Payer: BLUE CROSS/BLUE SHIELD | Admitting: Family Medicine

## 2021-04-17 ENCOUNTER — Encounter: Payer: Self-pay | Admitting: Family Medicine

## 2021-04-17 DIAGNOSIS — L732 Hidradenitis suppurativa: Secondary | ICD-10-CM

## 2021-04-17 DIAGNOSIS — E1165 Type 2 diabetes mellitus with hyperglycemia: Secondary | ICD-10-CM | POA: Diagnosis not present

## 2021-04-17 MED ORDER — TIRZEPATIDE 2.5 MG/0.5ML ~~LOC~~ SOAJ: 2.5000 mg | SUBCUTANEOUS | 2 refills | Status: DC

## 2021-04-17 NOTE — Progress Notes (Signed)
Virtual Visit via Video Note  I connected with Haley [name] on 04/17/21 at  2:00 PM EST by a video enabled telemedicine application and verified that I am speaking with the correct person using two identifiers.  Location: Patient: car Provider: office    I discussed the limitations of evaluation and management by telemedicine and the availability of in person appointments. The patient expressed understanding and agreed to proceed.  Parties involved in encounter  Patient: Haley [name]  Provider:  Loura Pardon MD   History of Present Illness: 35 yo [name] presents for medication management/refills   Wt Readings from Last 3 Encounters:  03/27/21 230 lb (104.3 kg)  12/15/20 226 lb (102.5 kg)  08/23/20 235 lb (106.6 kg)    DM2 Ozempic  0.25 mg weekly-was not covered  /hard to get, ? Going up on it  Is interested in [place]  Metformin 1000 mg bid  Lantus 24 u   No history of thyroid issues  Blood sugars have been high  Diet is horrible  Is looking into a plant based diet   Wants to loose some weight also   When she eats right -tends to get better glucose readings  Wants a referral to DM ed in [facility]   Exercise - maybe 20-30 minutes most days  Dislikes exercise in general - treadmill or elliptical machine   No low glucose readings   Her dermatologist px accutane recently   Lab Results  Component Value Date   HGBA1C 9.0 (H) 08/23/2020   Lab Results  Component Value Date   CREATININE 0.64 08/23/2020   BUN 13 08/23/2020   NA 135 08/23/2020   K 4.3 08/23/2020   CL 103 08/23/2020   CO2 23 08/23/2020    Hydradenitis  Has abscess on back -hard to stand up (dermatology gave her bactrim and then was changed to doxycyline)  Hurts to sit  Wants to get a standing desk  Will get [place] paperwork    Patient Active Problem List   Diagnosis Date Noted   Onychomycosis 12/15/2020   Attention deficit 12/15/2020   Generalized anxiety disorder 12/15/2020   Pelvic  pressure in female 12/15/2020   Cigar smoker 07/13/2020   Vaginal discharge 07/13/2020   Lateral epicondylitis of right elbow 04/12/2020   Insomnia 04/12/2020   Hypoalbuminemia 02/08/2020   Iron deficiency anemia due to chronic blood loss 02/08/2020   Type 2 diabetes mellitus with hyperglycemia ([place]) 11/12/2019   Hidradenitis suppurativa 11/12/2019   Past Medical History:  Diagnosis Date   Diabetes mellitus    Hidradenitis suppurativa    Psychosis ([place]) 12/16/2018   Past Surgical History:  Procedure Laterality Date   LAPAROTOMY N/A 02/09/2020   Procedure: INCISION AND DRAINAGE ABDOMINAL WALL ABCESS;  Surgeon: Ileana Roup, MD;  Location: [facility];  Service: General;  Laterality: N/A;   ROOT CANAL  05/17/2011   Social History   Tobacco Use   Smoking status: Every Day    Types: Cigars    Last attempt to quit: 02/03/2020    Years since quitting: 1.2   Smokeless tobacco: Never   Tobacco comments:    1-2 cigars per day  Substance Use Topics   Alcohol use: Yes    Comment: wine a few times a month   Drug use: Not Currently    Comment: previous MJ   Family History  Problem Relation Age of Onset   Drug abuse Mother    Cancer Father  unknown type   Kidney failure Father        s/p transplant   No Known Allergies Current Outpatient Medications on File Prior to Visit  Medication Sig Dispense Refill   Accu-Chek Softclix Lancets lancets 1 each by Other route QID. Use as instructed to check blood sugar up to 4 times a day. 200 each 2   blood glucose meter kit and supplies Dispense based on patient and insurance preference. Use up to four times daily as directed. (FOR ICD-10 E10.9, E11.9). 1 each 0   glucose blood test strip Use to test blood sugar up to 4 times a day 100 each 3   insulin glargine (LANTUS SOLOSTAR) 100 UNIT/ML Solostar Pen Inject 24 Units into the skin daily. 15 mL 3   Insulin Pen Needle 32G X 4 MM MISC 24 Units by Does not apply route at bedtime. 100  each 0   metFORMIN (GLUCOPHAGE) 1000 MG tablet Take 1 tablet (1,000 mg total) by mouth in the morning and at bedtime. 180 tablet 1   naproxen sodium (ALEVE) 220 MG tablet Take 220 mg by mouth daily as needed (pain).     OZEMPIC, 0.25 OR 0.5 MG/DOSE, 2 MG/1.5ML SOPN START WITH 0.25 ONCE WEEKLY FOR 4 WEEKS. THEN INCREASE TO 0.5 ONCE WEEKLY. (Patient not taking: Reported on 04/17/2021) 6 mL 0   No current facility-administered medications on file prior to visit.   Review of Systems  Constitutional:  Positive for malaise/fatigue. Negative for chills and fever.  HENT:  Negative for congestion, ear pain, sinus pain and sore throat.   Eyes:  Negative for blurred vision, discharge and redness.  Respiratory:  Negative for cough, shortness of breath and stridor.   Cardiovascular:  Negative for chest pain, palpitations and leg swelling.  Gastrointestinal:  Negative for abdominal pain, diarrhea, nausea and vomiting.  Genitourinary:  Negative for dysuria.  Musculoskeletal:  Negative for myalgias.  Skin:  Negative for rash.  Neurological:  Negative for dizziness and headaches.   Observations/Objective: Patient appears well, in no distress Weight is baseline  No facial swelling or asymmetry Normal voice-not hoarse and no slurred speech No obvious tremor or mobility impairment Moving neck and UEs normally Able to hear the call well  No cough or shortness of breath during interview  Talkative and mentally sharp with no cognitive changes Facial acne noted Affect is normal to mildly irritable    Assessment and Plan: Problem List Items Addressed This Visit       Endocrine   Type 2 diabetes mellitus with hyperglycemia ([place]) - Primary    Non compliant regarding diet and f/u, new to me  Rev last lab Wishes to change from ozempic to [employer] to pharmacy 2.5 mg weekly  Discussed side eff  Aware she may need to inc dose later  Lab Results  Component Value Date   HGBA1C 9.0 (H) 08/23/2020    Needs to come in for a1c-will arrange that  Enc strongly to consider better diet and exercise -she dislikes it No doubt DM is making her hydradenitis worse as well  She is interested in DM teaching Will need to f/u with pcp when she returns from maternity Plan to consider metforin 1000 mg bid and lantus 24 u daily -watching for hypoglycemia       Relevant Medications   tirzepatide (MOUNJARO) 2.5 MG/0.5ML Pen   Other Relevant Orders   Ambulatory referral to diabetic education     Musculoskeletal and Integument   Hidradenitis suppurativa  abscess on L mid back -seen in ER recently  Is uncomfortable to sit  Needs form for standing desk-will send Sees derm and planning on starting accutane soon         Follow Up Instructions: The office will call to set up labs for a1c and chemistries  You will also get a call to set up diabetic teaching   Try and eat a lot glycemic diet  Try to be active   Try mounjaro as directed 2.5 mg weekly and alert [place] if side effects or problems   Follow up with Dr Einar Pheasant when she returns - you can then discuss titrating medication up if appropriate   I discussed the assessment and treatment plan with the patient. The patient was provided an opportunity to ask questions and all were answered. The patient agreed with the plan and demonstrated an understanding of the instructions.   The patient was advised to call back or seek an in-person evaluation if the symptoms worsen or if the condition fails to improve as anticipated.     Loura Pardon, MD

## 2021-04-17 NOTE — Assessment & Plan Note (Addendum)
abscess on L mid back -seen in ER recently  Is uncomfortable to sit  Needs form for standing desk-will send Sees derm and planning on starting accutane soon

## 2021-04-17 NOTE — Assessment & Plan Note (Addendum)
Non compliant regarding diet and f/u, new to me  Rev last lab Wishes to change from ozempic to [employer] to pharmacy 2.5 mg weekly  Discussed side eff  Aware she may need to inc dose later  Lab Results  Component Value Date   HGBA1C 9.0 (H) 08/23/2020   Needs to come in for a1c-will arrange that  Enc strongly to consider better diet and exercise -she dislikes it No doubt DM is making her hydradenitis worse as well  She is interested in DM teaching Will need to f/u with pcp when she returns from maternity Plan to consider metforin 1000 mg bid and lantus 24 u daily -watching for hypoglycemia

## 2021-04-17 NOTE — Patient Instructions (Signed)
The office will call to set up labs for a1c and chemistries  Try and eat a lot glycemic diet  Try to be active   Try mounjaro as directed 2.5 mg weekly and alert [place] if side effects or problems   Follow up with Dr Selena Batten when she returns - you can then discuss titrating medication up if appropriate

## 2021-05-11 ENCOUNTER — Telehealth: Payer: Self-pay | Admitting: Family Medicine

## 2021-05-11 NOTE — Telephone Encounter (Signed)
Forms printed for Dr. Milinda Antis to fill out.   Mychart sent to pt.

## 2021-05-11 NOTE — Telephone Encounter (Signed)
Emailed to [employer]

## 2021-05-11 NOTE — Telephone Encounter (Signed)
Pt called in requesting to know status of forms that was emailed that needs to be filled out . Please Advise #[email or web address]*

## 2021-05-12 NOTE — Telephone Encounter (Signed)
Forms given to Dr. Milinda Antis.

## 2021-05-16 NOTE — Telephone Encounter (Signed)
Pt never viewed Christy's mychart message. Forms were faxed yesterday and copy sent to scanning. Called pt to let her know and no answer and pt's VM was full (no VM left)

## 2021-05-18 ENCOUNTER — Other Ambulatory Visit: Payer: Self-pay

## 2021-05-18 ENCOUNTER — Ambulatory Visit
Admission: EM | Admit: 2021-05-18 | Discharge: 2021-05-18 | Disposition: A | Discharge status: Home / Self Care | Payer: BLUE CROSS/BLUE SHIELD | Attending: Physician Assistant | Admitting: Physician Assistant

## 2021-05-18 ENCOUNTER — Encounter: Payer: Self-pay | Admitting: Emergency Medicine

## 2021-05-18 DIAGNOSIS — Z1152 Encounter for screening for COVID-19: Secondary | ICD-10-CM | POA: Diagnosis not present

## 2021-05-18 DIAGNOSIS — R051 Acute cough: Secondary | ICD-10-CM

## 2021-05-18 DIAGNOSIS — J069 Acute upper respiratory infection, unspecified: Secondary | ICD-10-CM | POA: Diagnosis not present

## 2021-05-18 MED ORDER — PROMETHAZINE-DM 6.25-15 MG/5ML PO SYRP: 5.0000 mL | ORAL_SOLUTION | Freq: Three times a day (TID) | ORAL | 0 refills | Status: DC | PRN

## 2021-05-18 MED ORDER — ALBUTEROL SULFATE HFA 108 (90 BASE) MCG/ACT IN AERS
2.0000 | INHALATION_SPRAY | Freq: Once | RESPIRATORY_TRACT | Status: AC
  Administered 2021-05-18: 15:00:00 2 via RESPIRATORY_TRACT

## 2021-05-18 NOTE — ED Triage Notes (Signed)
Pt c/o cough, HA, congestion, bodyaches and diarrhea x 2 days.

## 2021-05-18 NOTE — Discharge Instructions (Signed)
I believe that you have a virus.  There is no evidence of an infection that would warrant initiation of antibiotics on your exam.  We will contact you if your viral testing is positive but please stay in isolation until you receive these results.  Use your albuterol inhaler every 4-6 hours as needed for shortness of breath and coughing fits.  Use Mucinex, Flonase, Tylenol for additional symptom relief.  I have sent in Promethazine DM for cough.  This will make you sleepy so do not drive or drink alcohol while taking it.  If you have any worsening symptoms including fever not responding to medication, chest pain, shortness of breath, worsening cough you need to go to the emergency room.  If symptoms have not improved by next week please follow-up with either our clinic or your primary care provider.

## 2021-05-18 NOTE — ED Provider Notes (Signed)
[facility] Haley [name]    CSN: [id number] Arrival date & time: 05/18/21  1401      History   Chief Complaint Chief Complaint  Patient presents with   Cough   Headache   Generalized Body Aches   Diarrhea    HPI Haley [name] is a 35 y.o. female.   Patient presents today with a 2 to 3-day history of URI symptoms.  Reports cough, headache, nasal congestion, body aches, diarrhea, fatigue, malaise.  Denies any chest pain, shortness of breath, fever, nausea/vomiting, abdominal pain.  She has tried NyQuil without improvement of symptoms.  Denies any known sick contacts.  She has had COVID approximately 1 year ago.  She has not had COVID or influenza vaccine.  She is a smoker but has not been smoking symptoms began.  She denies history of allergies, asthma, COPD.  She does have a history of diabetes and reports blood sugars have been elevated since symptoms began.  She denies any recent antibiotic use.   Past Medical History:  Diagnosis Date   Diabetes mellitus    Hidradenitis suppurativa    Psychosis ([place]) 12/16/2018    Patient Active Problem List   Diagnosis Date Noted   Onychomycosis 12/15/2020   Attention deficit 12/15/2020   Generalized anxiety disorder 12/15/2020   Pelvic pressure in female 12/15/2020   Cigar smoker 07/13/2020   Vaginal discharge 07/13/2020   Lateral epicondylitis of right elbow 04/12/2020   Insomnia 04/12/2020   Hypoalbuminemia 02/08/2020   Iron deficiency anemia due to chronic blood loss 02/08/2020   Type 2 diabetes mellitus with hyperglycemia ([place]) 11/12/2019   Hidradenitis suppurativa 11/12/2019    Past Surgical History:  Procedure Laterality Date   LAPAROTOMY N/A 02/09/2020   Procedure: INCISION AND DRAINAGE ABDOMINAL WALL ABCESS;  Surgeon: Ileana Roup, MD;  Location: [facility];  Service: General;  Laterality: N/A;   ROOT CANAL  05/17/2011    OB History   No obstetric history on file.      Home Medications    Prior to Admission  medications   Medication Sig Start Date End Date Taking? Authorizing Provider  insulin glargine (LANTUS SOLOSTAR) 100 UNIT/ML Solostar Pen Inject 24 Units into the skin daily. 12/15/20  Yes Lesleigh Noe, MD  metFORMIN (GLUCOPHAGE) 1000 MG tablet Take 1 tablet (1,000 mg total) by mouth in the morning and at bedtime. 07/13/20  Yes Lesleigh Noe, MD  promethazine-dextromethorphan (PROMETHAZINE-DM) 6.25-15 MG/5ML syrup Take 5 mLs by mouth 3 (three) times daily as needed for cough. 05/18/21  Yes Rika Daughdrill K, PA-C  Accu-Chek Softclix Lancets lancets 1 each by Other route QID. Use as instructed to check blood sugar up to 4 times a day. 11/08/20   Lesleigh Noe, MD  blood glucose meter kit and supplies Dispense based on patient and insurance preference. Use up to four times daily as directed. (FOR ICD-10 E10.9, E11.9). 11/12/19   Lesleigh Noe, MD  glucose blood test strip Use to test blood sugar up to 4 times a day 11/17/20   Lesleigh Noe, MD  Insulin Pen Needle 32G X 4 MM MISC 24 Units by Does not apply route at bedtime. 02/16/20   Eugenie Filler, MD  naproxen sodium (ALEVE) 220 MG tablet Take 220 mg by mouth daily as needed (pain).    [provider]  tirzepatide Darcel Bayley) 2.5 MG/0.5ML Pen Inject 2.5 mg into the skin once a week. 04/17/21   Tower, Wynelle Fanny, MD    Family  History Family History  Problem Relation Age of Onset   Drug abuse Mother    Cancer Father        unknown type   Kidney failure Father        s/p transplant    Social History Social History   Tobacco Use   Smoking status: Every Day    Types: Cigars    Last attempt to quit: 02/03/2020    Years since quitting: 1.2   Smokeless tobacco: Never   Tobacco comments:    1-2 cigars per day  Vaping Use   Vaping Use: Never used  Substance Use Topics   Alcohol use: Yes    Comment: wine a few times a month   Drug use: Not Currently    Comment: previous MJ     Allergies   Patient has no known  allergies.   Review of Systems Review of Systems  Constitutional:  Positive for activity change and fatigue. Negative for appetite change and fever.  HENT:  Positive for congestion, postnasal drip, sinus pressure and sore throat. Negative for sneezing.   Respiratory:  Positive for cough. Negative for shortness of breath.   Cardiovascular:  Negative for chest pain.  Gastrointestinal:  Positive for diarrhea. Negative for abdominal pain, nausea and vomiting.  Musculoskeletal:  Positive for arthralgias and myalgias.  Neurological:  Positive for headaches. Negative for dizziness and light-headedness.    Physical Exam Triage Vital Signs ED Triage Vitals  Enc Vitals Group     BP 05/18/21 1422 127/83     Pulse Rate 05/18/21 1422 84     Resp 05/18/21 1422 18     Temp 05/18/21 1422 98.8 F (37.1 C)     Temp Source 05/18/21 1422 Oral     SpO2 05/18/21 1422 97 %     Weight --      Height --      Head Circumference --      Peak Flow --      Pain Score 05/18/21 1425 0     Pain Loc --      Pain Edu? --      Excl. in [place]? --    No data found.  Updated Vital Signs BP 127/83 (BP Location: Left Arm)    Pulse 84    Temp 98.8 F (37.1 C) (Oral)    Resp 18    LMP 05/06/2021    SpO2 97%   Visual Acuity Right Eye Distance:   Left Eye Distance:   Bilateral Distance:    Right Eye Near:   Left Eye Near:    Bilateral Near:     Physical Exam Vitals reviewed.  Constitutional:      General: She is awake. She is not in acute distress.    Appearance: Normal appearance. She is well-developed. She is not ill-appearing.     Comments: Very pleasant female appears stated age in no acute distress sitting comfortably in exam room  HENT:     Head: Normocephalic and atraumatic.     Right Ear: Tympanic membrane, ear canal and external ear normal. Tympanic membrane is not erythematous or bulging.     Left Ear: Tympanic membrane, ear canal and external ear normal. Tympanic membrane is not erythematous or  bulging.     Nose:     Right Sinus: Maxillary sinus tenderness present. No frontal sinus tenderness.     Left Sinus: Maxillary sinus tenderness present. No frontal sinus tenderness.     Mouth/Throat:  Pharynx: Uvula midline. Posterior oropharyngeal erythema present. No oropharyngeal exudate.     Comments: Erythema and drainage present posterior oropharynx Cardiovascular:     Rate and Rhythm: Normal rate and regular rhythm.     Heart sounds: Normal heart sounds, S1 normal and S2 normal. No murmur heard. Pulmonary:     Effort: Pulmonary effort is normal.     Breath sounds: Normal breath sounds. No wheezing, rhonchi or rales.     Comments: Reactive cough with deep breathing Psychiatric:        Behavior: Behavior is cooperative.     UC Treatments / Results  Labs (all labs ordered are listed, but only abnormal results are displayed) Labs Reviewed  COVID-19, FLU A+B AND RSV    EKG   Radiology No results found.  Procedures Procedures (including critical care time)  Medications Ordered in UC Medications  albuterol (VENTOLIN HFA) 108 (90 Base) MCG/ACT inhaler 2 puff (2 puffs Inhalation Given 05/18/21 1448)    Initial Impression / Assessment and Plan / UC Course  I have reviewed the triage vital signs and the nursing notes.  Pertinent labs & imaging results that were available during my care of the patient were reviewed by me and considered in my medical decision making (see chart for details).     Discussed likely viral etiology given short duration of symptoms.  No evidence of acute infection on physical exam that would warrant initiation of antibiotics.  Given patient is outside the window of effectiveness for Tamiflu we will send out viral testing including RSV, COVID, flu.  Patient was instructed to remain in isolation until results are obtained.  She was given albuterol in clinic with improvement of coughing symptoms and encouraged to use this every 4-6 hours as needed  for shortness of breath and coughing fits.  She was prescribed Promethazine DM for cough with instruction that this can be sedating and she should not drive or drink alcohol while taking this as drowsiness is a common side effect.  She can use Mucinex, Flonase, Tylenol for additional symptom relief.  She is to rest and drink plenty of fluid.  Discussed alarm symptoms that warrant emergent evaluation including high fever not responding to medication, chest pain, worsening cough, shortness of breath.  If symptoms have not improved within a week she is to follow-up with either our clinic or her primary care provider.  Strict return precautions given to which she expressed understanding.  Final Clinical Impressions(s) / UC Diagnoses   Final diagnoses:  Encounter for screening for COVID-19  Upper respiratory tract infection, unspecified type  Acute cough     Discharge Instructions      I believe that you have a virus.  There is no evidence of an infection that would warrant initiation of antibiotics on your exam.  We will contact you if your viral testing is positive but please stay in isolation until you receive these results.  Use your albuterol inhaler every 4-6 hours as needed for shortness of breath and coughing fits.  Use Mucinex, Flonase, Tylenol for additional symptom relief.  I have sent in Promethazine DM for cough.  This will make you sleepy so do not drive or drink alcohol while taking it.  If you have any worsening symptoms including fever not responding to medication, chest pain, shortness of breath, worsening cough you need to go to the emergency room.  If symptoms have not improved by next week please follow-up with either our clinic or your primary care  provider.     ED Prescriptions     Medication Sig Dispense Auth. Provider   promethazine-dextromethorphan (PROMETHAZINE-DM) 6.25-15 MG/5ML syrup Take 5 mLs by mouth 3 (three) times daily as needed for cough. 118 mL Alaze Garverick K,  PA-C      PDMP not reviewed this encounter.   Terrilee Croak, PA-C 05/18/21 1504

## 2021-05-19 LAB — COVID-19, FLU A+B AND RSV
Influenza A, NAA: DETECTED — AB
Influenza B, NAA: NOT DETECTED
RSV, NAA: NOT DETECTED
SARS-CoV-2, NAA: NOT DETECTED

## 2021-05-30 ENCOUNTER — Other Ambulatory Visit: Payer: Self-pay | Admitting: Family Medicine

## 2021-05-30 DIAGNOSIS — E1165 Type 2 diabetes mellitus with hyperglycemia: Secondary | ICD-10-CM

## 2021-06-02 MED ORDER — LANTUS SOLOSTAR 100 UNIT/ML ~~LOC~~ SOPN: 24.0000 [IU] | PEN_INJECTOR | Freq: Every day | SUBCUTANEOUS | 0 refills | Status: DC

## 2021-06-02 NOTE — Telephone Encounter (Signed)
Patient has not had an A1C since March 2022.  She saw Dr. Milinda Antis virtually in November 2022 for diabetes.  Needs A1c ASAP, let me know when you have spoken with her and the location of which she will obtain her A1c.  She will also need to schedule an appointment with her PCP upon PCPs return.

## 2021-06-05 ENCOUNTER — Encounter: Payer: Self-pay | Admitting: Family Medicine

## 2021-06-05 NOTE — Telephone Encounter (Signed)
Unable to LM no VM, Mychart letter sent

## 2021-06-19 ENCOUNTER — Encounter: Payer: BLUE CROSS/BLUE SHIELD | Attending: Family Medicine | Admitting: Skilled Nursing Facility1

## 2021-08-25 ENCOUNTER — Other Ambulatory Visit: Payer: Self-pay | Admitting: Family Medicine

## 2021-08-26 ENCOUNTER — Other Ambulatory Visit: Payer: Self-pay | Admitting: Family Medicine

## 2021-09-11 ENCOUNTER — Ambulatory Visit (INDEPENDENT_AMBULATORY_CARE_PROVIDER_SITE_OTHER): Payer: BLUE CROSS/BLUE SHIELD | Admitting: Family Medicine

## 2021-09-11 VITALS — BP 120/80 | HR 89 | Temp 98.2°F | Ht 65.0 in | Wt 234.2 lb

## 2021-09-11 DIAGNOSIS — B379 Candidiasis, unspecified: Secondary | ICD-10-CM

## 2021-09-11 DIAGNOSIS — R41840 Attention and concentration deficit: Secondary | ICD-10-CM

## 2021-09-11 DIAGNOSIS — E1165 Type 2 diabetes mellitus with hyperglycemia: Secondary | ICD-10-CM

## 2021-09-11 DIAGNOSIS — F411 Generalized anxiety disorder: Secondary | ICD-10-CM

## 2021-09-11 DIAGNOSIS — G4700 Insomnia, unspecified: Secondary | ICD-10-CM

## 2021-09-11 DIAGNOSIS — D500 Iron deficiency anemia secondary to blood loss (chronic): Secondary | ICD-10-CM

## 2021-09-11 DIAGNOSIS — T3695XA Adverse effect of unspecified systemic antibiotic, initial encounter: Secondary | ICD-10-CM

## 2021-09-11 LAB — POCT GLYCOSYLATED HEMOGLOBIN (HGB A1C): Hemoglobin A1C: 11.8 % — AB (ref 4.0–5.6)

## 2021-09-11 MED ORDER — FLUCONAZOLE 150 MG PO TABS: 150.0000 mg | ORAL_TABLET | Freq: Once | ORAL | 0 refills | Status: AC

## 2021-09-11 MED ORDER — ACCU-CHEK SOFTCLIX LANCETS MISC: 1.0000 | Freq: Four times a day (QID) | 2 refills | Status: AC

## 2021-09-11 MED ORDER — METFORMIN HCL ER 500 MG PO TB24: 2000.0000 mg | ORAL_TABLET | Freq: Every day | ORAL | 0 refills | Status: AC

## 2021-09-11 MED ORDER — GLUCOSE BLOOD VI STRP: ORAL_STRIP | 3 refills | Status: AC

## 2021-09-11 MED ORDER — TRAZODONE HCL 50 MG PO TABS: 25.0000 mg | ORAL_TABLET | Freq: Every evening | ORAL | 0 refills | Status: AC | PRN

## 2021-09-11 NOTE — Progress Notes (Signed)
? ?Subjective:  ? ?  ?Haley [name] is a 36 y.o. female presenting for Referral (Wants to be tested for ADHD), Medication Management, and Back Pain ?  ? ? ?Back Pain ? ? ?#Diabetes ?Currently taking lantus and ozempic are too expensive, ran out of metformin, mounjaro not covered by insurance  ?- metformin 1000 once daily ?- ozempic 0.25 mg weekly x 2 weeks - $100 w/coupon ?- stopped insulin in December due to cost  ? ?Using medications without difficulties: No ? ?Last HgbA1c:  ?Lab Results  ?Component Value Date  ? HGBA1C 11.8 (A) 09/11/2021  ? ?Diet: not good ?Exercise: not good ? ?Diabetes Health Maintenance Due:  ?  ?Diabetes Health Maintenance Due  ?Topic Date Due  ? FOOT EXAM  Never done  ? OPHTHALMOLOGY EXAM  Never done  ? URINE MICROALBUMIN  Never done  ? HEMOGLOBIN A1C  03/13/2022  ? ?#Vaginal discharge ?- bactrim last year ?- hidrandenitis flare - was on doxycycle ?- was given fluconazole w/ some improvement ?- came back ?- white discharge ?- took bactrim for UTI - this has resolved ? ?#ADHD ?- no hx a child ?- hard to focus ?- last year ?- not picking up after herself ?- always late ?- cannot finish one thing before moving on to the next thing ?- worried about things all the time ?- sleep: horrible - only 4 hours, since 2018, feels sleepy ?- discussed in July 2022 and started wellbutrin but it caused eye twitching ?-  ? ?Insomnia ?- naproxen-PM - will get her to sleep ? ? ? ?Review of Systems  ?Musculoskeletal:  Positive for back pain.  ? ? ?Social History  ? ?Tobacco Use  ?Smoking Status Every Day  ? Types: Cigars  ? Last attempt to quit: 02/03/2020  ? Years since quitting: 1.6  ?Smokeless Tobacco Never  ?Tobacco Comments  ? 1-2 cigars per day  ? ? ? ?   ?Objective:  ?  ?BP Readings from Last 3 Encounters:  ?09/11/21 120/80  ?05/18/21 127/83  ?03/27/21 119/69  ? ?Wt Readings from Last 3 Encounters:  ?09/11/21 234 lb 4 oz (106.3 kg)  ?03/27/21 230 lb (104.3 kg)  ?12/15/20 226 lb (102.5 kg)  ? ? ?BP 120/80    Pulse 89   Temp 98.2 ?F (36.8 ?C) (Oral)   Ht 5\' 5"  (1.651 m)   Wt 234 lb 4 oz (106.3 kg)   SpO2 98%   BMI 38.98 kg/m?  ? ? ?Physical Exam ?Constitutional:   ?   General: She is not in acute distress. ?   Appearance: She is well-developed. She is not diaphoretic.  ?HENT:  ?   Right Ear: External ear normal.  ?   Left Ear: External ear normal.  ?   Nose: Nose normal.  ?Eyes:  ?   Conjunctiva/sclera: Conjunctivae normal.  ?Cardiovascular:  ?   Rate and Rhythm: Normal rate and regular rhythm.  ?   Heart sounds: No murmur heard. ?Pulmonary:  ?   Effort: Pulmonary effort is normal. No respiratory distress.  ?   Breath sounds: Normal breath sounds. No wheezing.  ?Musculoskeletal:  ?   Cervical back: Neck supple.  ?Skin: ?   General: Skin is warm and dry.  ?   Capillary Refill: Capillary refill takes less than 2 seconds.  ?Neurological:  ?   Mental Status: She is alert. Mental status is at baseline.  ?Psychiatric:     ?   Mood and Affect: Mood normal.     ?  Behavior: Behavior normal.  ? ? ? ?  09/11/2021  ?  5:05 PM  ?GAD 7 : Generalized Anxiety Score  ?Nervous, Anxious, on Edge 2  ?Control/stop worrying 2  ?Worry too much - different things 2  ?Trouble relaxing 1  ?Restless 3  ?Easily annoyed or irritable 3  ?Afraid - awful might happen 2  ?Total GAD 7 Score 15  ?Anxiety Difficulty Very difficult  ? ? ? ?  09/11/2021  ?  5:05 PM 12/15/2020  ? 11:41 AM  ?Depression screen PHQ 2/9  ?Decreased Interest 3 3  ?Down, Depressed, Hopeless 1 0  ?PHQ - 2 Score 4 3  ?Altered sleeping 3 3  ?Tired, decreased energy 3 3  ?Change in appetite 1 0  ?Feeling bad or failure about yourself  2 1  ?Trouble concentrating 3 3  ?Moving slowly or fidgety/restless 3 0  ?Suicidal thoughts 0 0  ?PHQ-9 Score 19 13  ?Difficult doing work/chores Very difficult Somewhat difficult  ? ? ? ? ?   ?Assessment & Plan:  ? ?Problem List Items Addressed This Visit   ? ?  ? Endocrine  ? Type 2 diabetes mellitus with hyperglycemia (HCC) - Primary  ?  Lab  Results  ?Component Value Date  ? HGBA1C 11.8 (A) 09/11/2021  ?Poorly controlled in the setting of poor diet, as well as adherence to medications secondary to cost.  She also struggles with close follow-up.  Switch metformin to long-acting to see if this is better tolerated.  Start with 1000 mg daily and increase to 2000 mg as tolerated.  We will discuss with pharmacy low cost options.  She is unable to afford Ozempic plus insulin but can pay for Ozempic at this time.  Continue Ozempic 0.25 mg with plan to increase to 0.5 mg in 2 weeks.  Return in 4 weeks with home glucose monitoring to reassess. ?  ?  ? Relevant Medications  ? OZEMPIC, 0.25 OR 0.5 MG/DOSE, 2 MG/1.5ML SOPN  ? glucose blood test strip  ? Accu-Chek Softclix Lancets lancets  ? metFORMIN (GLUCOPHAGE-XR) 500 MG 24 hr tablet  ? Other Relevant Orders  ? POCT glycosylated hemoglobin (Hb A1C) (Completed)  ? Ambulatory referral to Nutrition and Diabetic Education  ? Comprehensive metabolic panel  ? Lipid panel  ?  ? Other  ? Iron deficiency anemia due to chronic blood loss  ? Relevant Orders  ? CBC  ? Insomnia  ?  We will start trazodone 25 to 100 mg nightly to see if that improves sleep.  She is only sleeping approximately 4 hours, and I suspect this may be causing some of her focus issues. ? ?  ?  ? Relevant Medications  ? traZODone (DESYREL) 50 MG tablet  ? Attention deficit  ?  Patient notes side effects to Wellbutrin, however never came to follow-up visit after last July.  Referral to psychiatry last year was denied.  We will place new referral for ADHD evaluation.  However she does seem to have comorbid anxiety and depression as well as insomnia.  Discussed that these may be contributing to her focus and attention difficulties.  And offered treatment for them see plan. ? ?  ?  ? Relevant Orders  ? Ambulatory referral to Psychiatry  ? Generalized anxiety disorder  ?  GAD elevated, and patient notes some anxiety.  She is nervous about medication side  effects and is interested in ADHD evaluation.  List of SSRIs and SNRIs given to patient to look  up.  Discussed that I do feel like she may benefit from these, she will reach out if she would like to start 1.  We will also readdress in 4 weeks. ? ?  ?  ? Relevant Medications  ? traZODone (DESYREL) 50 MG tablet  ? Other Relevant Orders  ? TSH  ? Vitamin D, 25-hydroxy  ? Ambulatory referral to Psychiatry  ? ?Other Visit Diagnoses   ? ? Antibiotic-induced yeast infection      ? Relevant Medications  ? fluconazole (DIFLUCAN) 150 MG tablet  ? ?  ? ?Yeast infection in setting of recent abx use. If symptoms do no improve will test for BV. Exam deferred ? ?I spent 56 minutes with pt , obtaining history, examining, reviewing chart, documenting encounter and discussing the above plan of care. ? ? ?Return in about 4 weeks (around 10/09/2021) for diabetes, sleep, adhd. ? ?Lesleigh Noe, MD ? ? ? ?

## 2021-09-11 NOTE — Assessment & Plan Note (Signed)
We will start trazodone 25 to 100 mg nightly to see if that improves sleep.  She is only sleeping approximately 4 hours, and I suspect this may be causing some of her focus issues. ?

## 2021-09-11 NOTE — Assessment & Plan Note (Signed)
GAD elevated, and patient notes some anxiety.  She is nervous about medication side effects and is interested in ADHD evaluation.  List of SSRIs and SNRIs given to patient to look up.  Discussed that I do feel like she may benefit from these, she will reach out if she would like to start 1.  We will also readdress in 4 weeks. ?

## 2021-09-11 NOTE — Patient Instructions (Addendum)
Diabetes ?- work on diet - nutrition referral ?- switch to long acting metformin ?Week 1: Take 1000 mg or 2 tablets ?Week 2: take 1500 mg or 3 tablets ?Week 3: Take 2000 mg or 4 tablets if tolerating ? ?Continue ozempic ?After 4 weeks increase to 0.5 mg weekly ? ?Check sugars at home  ? ? ?Rash ?- try hydrocortisone cream twice daily ?- update if not improving ? ?Yeast infection ?- fluconazole ? ?Insomnia ?- try trazodone ?- Start with 25 mg ?- increase by 25 mg up to 100 mg as needed for sleep ? ? ? ? ? ?

## 2021-09-11 NOTE — Assessment & Plan Note (Signed)
Lab Results  ?Component Value Date  ? HGBA1C 11.8 (A) 09/11/2021  ? ?Poorly controlled in the setting of poor diet, as well as adherence to medications secondary to cost.  She also struggles with close follow-up.  Switch metformin to long-acting to see if this is better tolerated.  Start with 1000 mg daily and increase to 2000 mg as tolerated.  We will discuss with pharmacy low cost options.  She is unable to afford Ozempic plus insulin but can pay for Ozempic at this time.  Continue Ozempic 0.25 mg with plan to increase to 0.5 mg in 2 weeks.  Return in 4 weeks with home glucose monitoring to reassess. ?

## 2021-09-11 NOTE — Assessment & Plan Note (Signed)
Patient notes side effects to Wellbutrin, however never came to follow-up visit after last July.  Referral to psychiatry last year was denied.  We will place new referral for ADHD evaluation.  However she does seem to have comorbid anxiety and depression as well as insomnia.  Discussed that these may be contributing to her focus and attention difficulties.  And offered treatment for them see plan. ?

## 2021-09-12 ENCOUNTER — Other Ambulatory Visit: Payer: Self-pay | Admitting: Family Medicine

## 2021-09-12 ENCOUNTER — Encounter: Payer: Self-pay | Admitting: Family Medicine

## 2021-09-12 ENCOUNTER — Ambulatory Visit: Payer: BLUE CROSS/BLUE SHIELD | Admitting: Family Medicine

## 2021-09-12 ENCOUNTER — Telehealth: Payer: Self-pay | Admitting: Pharmacist

## 2021-09-12 DIAGNOSIS — E1165 Type 2 diabetes mellitus with hyperglycemia: Secondary | ICD-10-CM

## 2021-09-12 DIAGNOSIS — E559 Vitamin D deficiency, unspecified: Secondary | ICD-10-CM

## 2021-09-12 DIAGNOSIS — F411 Generalized anxiety disorder: Secondary | ICD-10-CM

## 2021-09-12 LAB — TSH: TSH: 1.11 u[IU]/mL (ref 0.35–5.50)

## 2021-09-12 LAB — CBC
HCT: 34.9 % — ABNORMAL LOW (ref 36.0–46.0)
Hemoglobin: 11.4 g/dL — ABNORMAL LOW (ref 12.0–15.0)
MCHC: 32.6 g/dL (ref 30.0–36.0)
MCV: 84.3 fl (ref 78.0–100.0)
Platelets: 207 10*3/uL (ref 150.0–400.0)
RBC: 4.15 Mil/uL (ref 3.87–5.11)
RDW: 13.3 % (ref 11.5–15.5)
WBC: 9.7 10*3/uL (ref 4.0–10.5)

## 2021-09-12 LAB — COMPREHENSIVE METABOLIC PANEL
ALT: 17 U/L (ref 0–35)
AST: 17 U/L (ref 0–37)
Albumin: 3.5 g/dL (ref 3.5–5.2)
Alkaline Phosphatase: 83 U/L (ref 39–117)
BUN: 6 mg/dL (ref 6–23)
CO2: 26 mEq/L (ref 19–32)
Calcium: 8.9 mg/dL (ref 8.4–10.5)
Chloride: 99 mEq/L (ref 96–112)
Creatinine, Ser: 0.55 mg/dL (ref 0.40–1.20)
GFR: 116.41 mL/min (ref >60.00)
Glucose, Bld: 400 mg/dL — ABNORMAL HIGH (ref 70–99)
Potassium: 4.2 mEq/L (ref 3.5–5.1)
Sodium: 134 mEq/L — ABNORMAL LOW (ref 135–145)
Total Bilirubin: 0.3 mg/dL (ref 0.2–1.2)
Total Protein: 7.1 g/dL (ref 6.0–8.3)

## 2021-09-12 LAB — LIPID PANEL
Cholesterol: 121 mg/dL (ref 0–200)
HDL: 37.8 mg/dL — ABNORMAL LOW (ref >39.00)
LDL Cholesterol: 69 mg/dL (ref 0–99)
NonHDL: 83.62
Total CHOL/HDL Ratio: 3
Triglycerides: 74 mg/dL (ref 0.0–149.0)
VLDL: 14.8 mg/dL (ref 0.0–40.0)

## 2021-09-12 LAB — VITAMIN D 25 HYDROXY (VIT D DEFICIENCY, FRACTURES): VITD: <7 ng/mL — ABNORMAL LOW (ref 30.00–100.00)

## 2021-09-12 MED ORDER — ESCITALOPRAM OXALATE 10 MG PO TABS: 10.0000 mg | ORAL_TABLET | Freq: Every day | ORAL | 1 refills | Status: AC

## 2021-09-12 MED ORDER — VITAMIN D (ERGOCALCIFEROL) 1.25 MG (50000 UNIT) PO CAPS: 50000.0000 [IU] | ORAL_CAPSULE | ORAL | 1 refills | Status: AC

## 2021-09-12 MED ORDER — SOLIQUA 100-33 UNT-MCG/ML ~~LOC~~ SOPN: 30.0000 [IU] | PEN_INJECTOR | Freq: Every day | SUBCUTANEOUS | 1 refills | Status: AC

## 2021-09-12 NOTE — Telephone Encounter (Signed)
Discussed DM med cost issues with PCP Dr Selena Batten. Agreed to start Soliqua - combo of insulin + GLP-1 in 1 product for 1 copay. ? ?Will start with 30 units given A1c > 11 and she has already been on insulin and GLP-1 previously. ? ?Copay card info: ?BIN: [id number] PCN: [employer]; GRP: [id number]; ID [id number] ? ?Attempted PA through covermymeds. Response was "Your PA has been resolved, no additional PA is required. For further inquiries please contact the number on the back of the member prescription card." ?Key: [id number] ?

## 2021-09-13 ENCOUNTER — Other Ambulatory Visit (INDEPENDENT_AMBULATORY_CARE_PROVIDER_SITE_OTHER): Payer: BLUE CROSS/BLUE SHIELD

## 2021-09-13 DIAGNOSIS — R7989 Other specified abnormal findings of blood chemistry: Secondary | ICD-10-CM | POA: Diagnosis not present

## 2021-09-13 LAB — FERRITIN: Ferritin: 21.9 ng/mL (ref 10.0–291.0)

## 2021-09-15 NOTE — Telephone Encounter (Signed)
Patient is calling see if a new meter to be sent in. She is unable to find her old one. [facility] . Please advise?

## 2021-09-18 MED ORDER — BLOOD GLUCOSE METER KIT: PACK | 0 refills | Status: AC

## 2021-09-18 NOTE — Telephone Encounter (Signed)
Sent to pharmacy 

## 2021-09-18 NOTE — Addendum Note (Signed)
Addended by: Waunita Schooner R on: 09/18/2021 10:04 AM ? ? Modules accepted: Orders ? ?

## 2021-09-19 NOTE — Telephone Encounter (Signed)
Attempted to call pt, but no answer and VM box not set up. Mychart sent to pt.  ?

## 2021-09-24 ENCOUNTER — Other Ambulatory Visit: Payer: Self-pay | Admitting: Family Medicine

## 2021-10-12 ENCOUNTER — Ambulatory Visit: Payer: BLUE CROSS/BLUE SHIELD | Admitting: Family Medicine

## 2021-10-18 ENCOUNTER — Encounter: Payer: Self-pay | Admitting: Infectious Diseases

## 2021-11-05 ENCOUNTER — Other Ambulatory Visit: Payer: Self-pay

## 2021-11-05 ENCOUNTER — Emergency Department
Admission: EM | Admit: 2021-11-05 | Discharge: 2021-11-05 | Disposition: A | Discharge status: Home / Self Care | Payer: BLUE CROSS/BLUE SHIELD | Attending: Emergency Medicine | Admitting: Emergency Medicine

## 2021-11-05 DIAGNOSIS — L02211 Cutaneous abscess of abdominal wall: Secondary | ICD-10-CM | POA: Diagnosis present

## 2021-11-05 DIAGNOSIS — L0291 Cutaneous abscess, unspecified: Secondary | ICD-10-CM

## 2021-11-05 MED ORDER — SULFAMETHOXAZOLE-TRIMETHOPRIM 800-160 MG PO TABS: 1.0000 | ORAL_TABLET | Freq: Two times a day (BID) | ORAL | 0 refills | Status: DC

## 2021-11-05 MED ORDER — SULFAMETHOXAZOLE-TRIMETHOPRIM 800-160 MG PO TABS
1.0000 | ORAL_TABLET | Freq: Once | ORAL | Status: AC
  Administered 2021-11-05: 1 via ORAL
  Filled 2021-11-05: qty 1

## 2021-11-05 MED ORDER — HYDROCODONE-ACETAMINOPHEN 5-325 MG PO TABS: 1.0000 | ORAL_TABLET | ORAL | 0 refills | Status: AC | PRN

## 2021-11-05 MED ORDER — FLUCONAZOLE 150 MG PO TABS: 150.0000 mg | ORAL_TABLET | Freq: Once | ORAL | 0 refills | Status: AC

## 2021-11-05 MED ORDER — LIDOCAINE-EPINEPHRINE (PF) 2 %-1:200000 IJ SOLN
10.0000 mL | Freq: Once | INTRAMUSCULAR | Status: AC
  Administered 2021-11-05: 10 mL
  Filled 2021-11-05: qty 20

## 2021-11-05 NOTE — ED Notes (Signed)
Pt discharged prior to RN assessment. Pt verbalized understanding of discharge instructions, prescriptions, and follow-up care instructions. Pt advised if symptoms worsen to return to ED. 

## 2021-11-05 NOTE — ED Provider Notes (Signed)
[facility] Provider Note  Patient Contact: 10:49 PM (approximate)   History   Abscess   HPI  Haley [name] is a 36 y.o. female who presents the emergency department complaining of her lower abdominal/suprapubic region abscess.  Patient has a history of hidradenitis suppurativa and states that she has had recurrent abscesses for a long time.  Patient states that she is never been told that she has had MRSA but is typically treated with medications like Bactrim, doxycycline or clindamycin.  Patient has been trying to put warm compresses to drain at home but states its been enlarging without any drainage.  No systemic complaint of fever, chills, GI symptoms.     Physical Exam   Triage Vital Signs: ED Triage Vitals  Enc Vitals Group     BP 11/05/21 2204 (!) 141/95     Pulse Rate 11/05/21 2204 98     Resp 11/05/21 2204 18     Temp 11/05/21 2204 98.7 F (37.1 C)     Temp src --      SpO2 11/05/21 2204 99 %     Weight 11/05/21 2202 235 lb (106.6 kg)     Height --      Head Circumference --      Peak Flow --      Pain Score 11/05/21 2201 5     Pain Loc --      Pain Edu? --      Excl. in [place]? --     Most recent vital signs: Vitals:   11/05/21 2204  BP: (!) 141/95  Pulse: 98  Resp: 18  Temp: 98.7 F (37.1 C)  SpO2: 99%     General: Alert and in no acute distress.  Cardiovascular:  Good peripheral perfusion Respiratory: Normal respiratory effort without tachypnea or retractions. Lungs CTAB.  Musculoskeletal: Full range of motion to all extremities.  Neurologic:  No gross focal neurologic deficits are appreciated.  Skin:   Large edematous, erythematous and fluctuant lesion noted to the suprapubic/lower abdominal region.  Area is very tender to palpation with again fluctuance and induration.  Total measure of erythema including area of fluctuance is roughly 8 cm in diameter Other:   ED Results / Procedures / Treatments   Labs (all labs ordered  are listed, but only abnormal results are displayed) Labs Reviewed - No data to display   EKG     RADIOLOGY    No results found.  PROCEDURES:  Critical Care performed: No  ..Incision and Drainage  Date/Time: 11/05/2021 11:25 PM  Performed by: Darletta Moll, PA-C Authorized by: Darletta Moll, PA-C   Consent:    Consent obtained:  Verbal   Consent given by:  Patient   Risks discussed:  Bleeding, incomplete drainage and pain Universal protocol:    Procedure explained and questions answered to patient or proxy's satisfaction: yes     Immediately prior to procedure, a time out was called: yes     Patient identity confirmed:  Verbally with patient Location:    Type:  Abscess   Location:  Trunk   Trunk location:  Abdomen (suprapubic region) Anesthesia:    Anesthesia method:  Local infiltration   Local anesthetic:  Lidocaine 1% WITH epi Procedure type:    Complexity:  Complex Procedure details:    Ultrasound guidance: no     Incision types:  Single straight   Incision depth:  Subcutaneous   Wound management:  Probed and deloculated and extensive cleaning  Drainage:  Purulent   Drainage amount:  Copious   Wound treatment:  Wound left open Post-procedure details:    Procedure completion:  Tolerated well, no immediate complications    MEDICATIONS ORDERED IN ED: Medications  lidocaine-EPINEPHrine (XYLOCAINE W/EPI) 2 %-1:200000 (PF) injection 10 mL (has no administration in time range)  sulfamethoxazole-trimethoprim (BACTRIM DS) 800-160 MG per tablet 1 tablet (has no administration in time range)     IMPRESSION / MDM / ASSESSMENT AND PLAN / ED COURSE  I reviewed the triage vital signs and the nursing notes.                              Differential diagnosis includes, but is not limited to, ingrown hair, folliculitis, abscess, MRSA  Patient's presentation is most consistent with acute illness / injury with system symptoms.   Patient's  diagnosis is consistent with abscess.  Patient presents the emergency department with an edematous, erythematous and painful lesion to the suprapubic region/lower abdominal wall.  Patient had abscess appreciated in this area that was incised and drained in the emergency department.  Ongoing care for incision and drainage is discussed with the patient.  She will be placed on Bactrim for infection.  Limited prescription for pain medication and Diflucan for after finishing antibiotic.  Concerning signs and symptoms to return to the ED are discussed.  Otherwise follow-up primary care..  Patient is given ED precautions to return to the ED for any worsening or new symptoms.        FINAL CLINICAL IMPRESSION(S) / ED DIAGNOSES   Final diagnoses:  Abscess     Rx / DC Orders   ED Discharge Orders          Ordered    sulfamethoxazole-trimethoprim (BACTRIM DS) 800-160 MG tablet  2 times daily        11/05/21 2328    HYDROcodone-acetaminophen (NORCO/VICODIN) 5-325 MG tablet  Every 4 hours PRN        11/05/21 2328    fluconazole (DIFLUCAN) 150 MG tablet   Once       Note to Pharmacy: Take after finishing antibiotic   11/05/21 2328             Note:  This document was prepared using Dragon voice recognition software and may include unintentional dictation errors.   Brynda Peon 11/05/21 2330    Carrie Mew, MD 11/11/21 2324

## 2021-11-05 NOTE — ED Provider Triage Note (Signed)
Emergency Medicine Provider Triage Evaluation Note  Haley [name] , a 36 y.o. female  was evaluated in triage.  Pt complains of abscess.  Review of Systems  Positive: Abscess Negative: Fever  Physical Exam  BP (!) 141/95   Pulse 98   Temp 98.7 F (37.1 C)   Resp 18   Wt 106.6 kg   SpO2 99%   BMI 39.11 kg/m  Gen:   Awake, no distress   Resp:  Normal effort  MSK:   Moves extremities without difficulty  Other:  Right groin abscess, nondraining  Medical Decision Making  Medically screening exam initiated at 10:08 PM.  Appropriate orders placed.  Haley [name] was informed that the remainder of the evaluation will be completed by another provider, this initial triage assessment does not replace that evaluation, and the importance of remaining in the ED until their evaluation is complete.     Haley Every, MD 11/05/21 2303

## 2021-11-05 NOTE — ED Triage Notes (Addendum)
Pt presents today with abdominal/ groin abscess that started a few days ago. Pt presents today with increased pain and tenderness. Pt stated it has not opened or drained.

## 2021-11-20 IMAGING — CT CT ABD-PELV W/ CM
2 of 4 series · 16 of 46 positions shown, 18 images · IV contrast (Omnipaque)
Comparison: Ultrasound abdomen 12/29/2012

CLINICAL DATA: Patient reports abscess to lower abdomen since
[REDACTED]. Diabetes, area is red and swollen.

EXAM:
CT ABDOMEN AND PELVIS WITH CONTRAST
TECHNIQUE: Multidetector CT imaging of the abdomen and pelvis was performed
using the standard protocol following bolus administration of
intravenous contrast.
CONTRAST:  100mL OMNIPAQUE IOHEXOL 300 MG/ML  SOLN

[Series 2: axial st · axial · 0.89mm/px · z∈[-516,-56]mm · 13 of 102 slices shown, 15 images]
[im 5/102  soft-tissue]
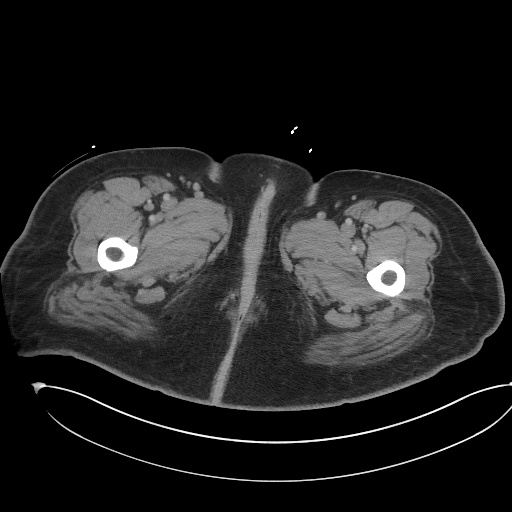
[im 5/102  bone]
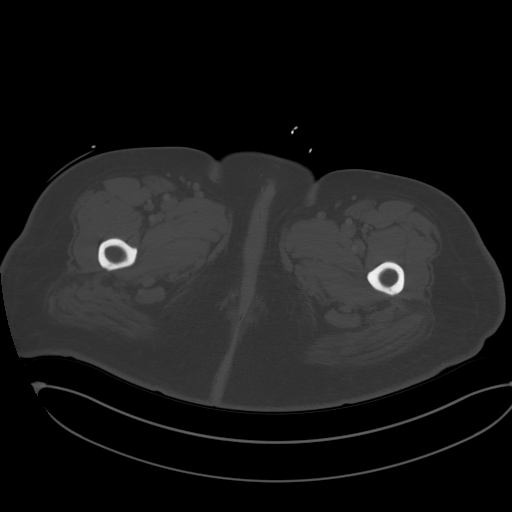
[im 14/102  soft-tissue]
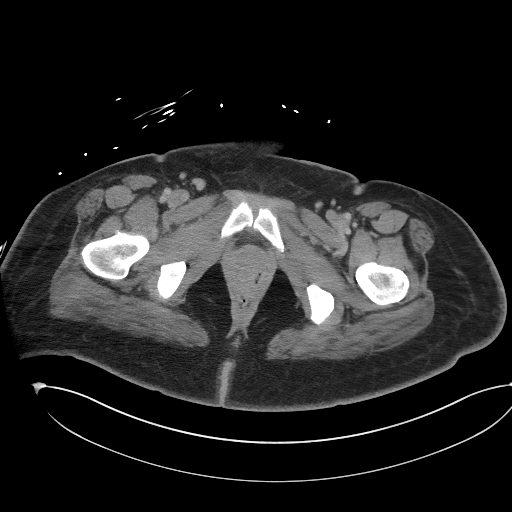
[im 22/102  soft-tissue]
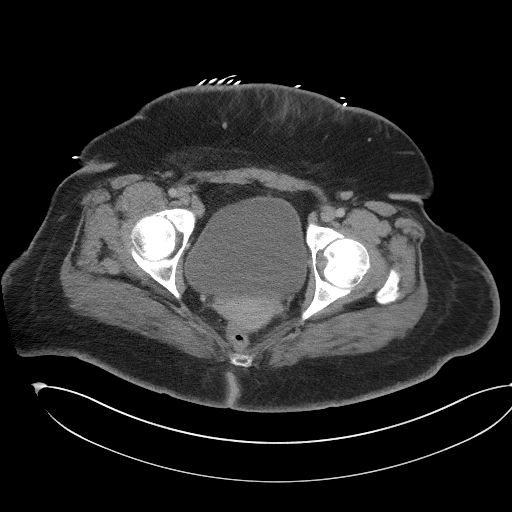
[im 27/102  soft-tissue]
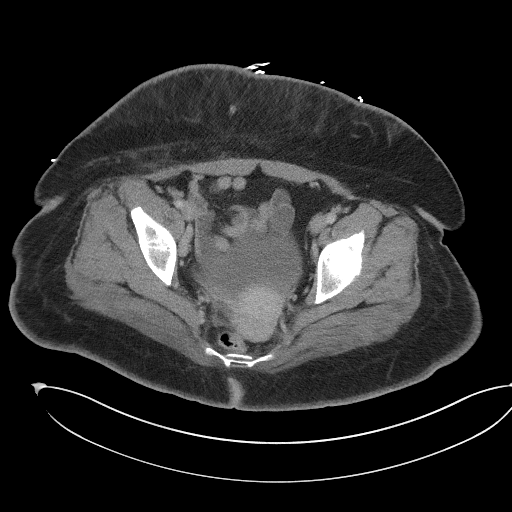
[im 36/102  soft-tissue]
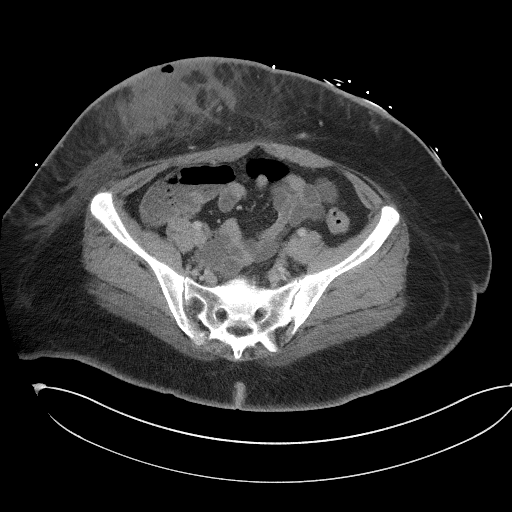
[im 44/102  soft-tissue]
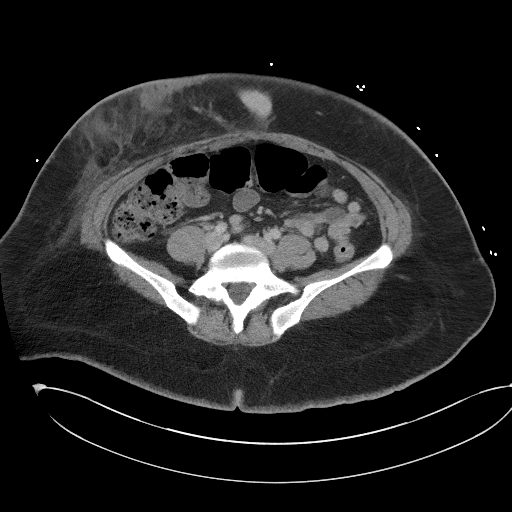
[im 53/102  soft-tissue]
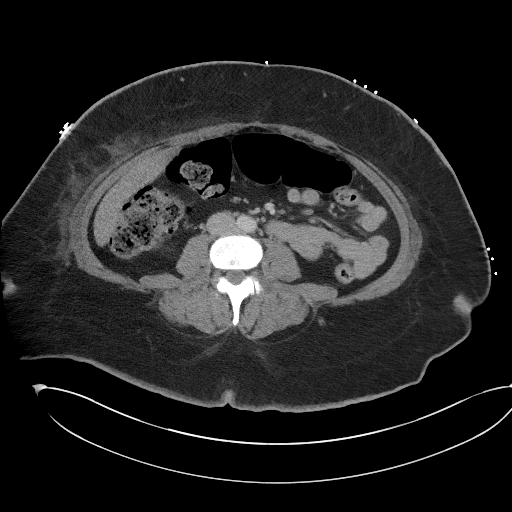
[im 58/102  soft-tissue]
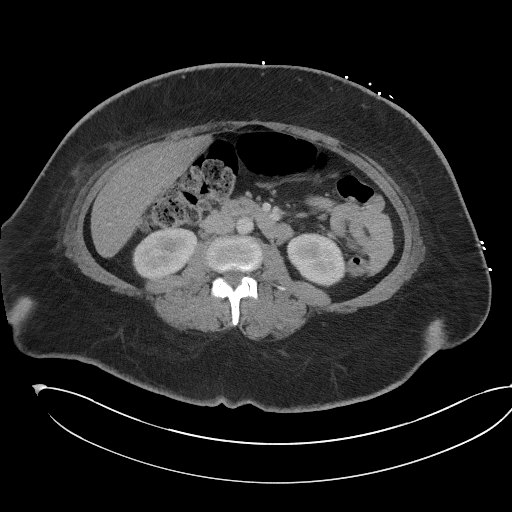
[im 66/102  soft-tissue]
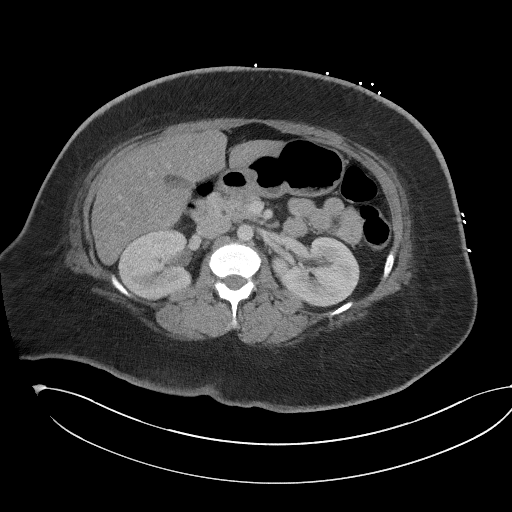
[im 66/102  bone]
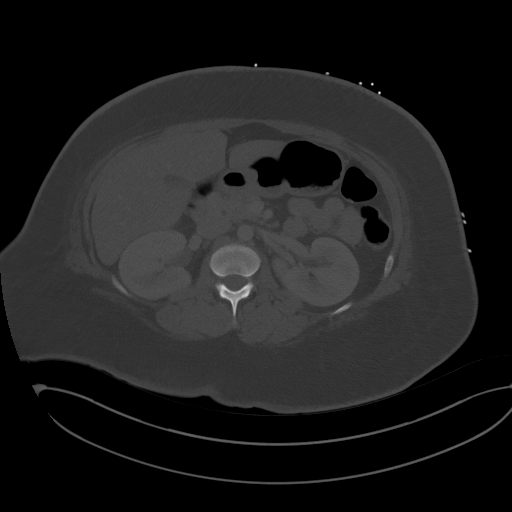
[im 75/102  soft-tissue]
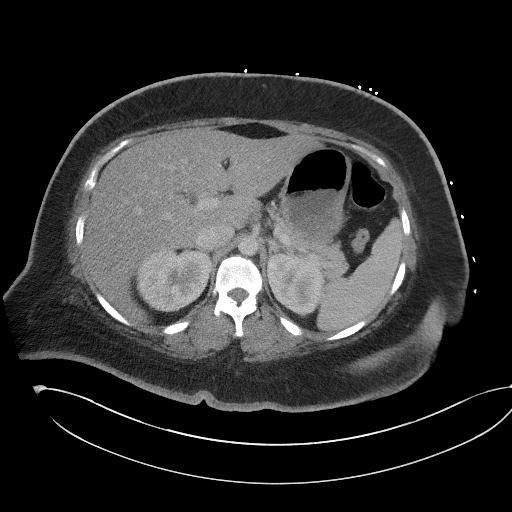
[im 80/102  soft-tissue]
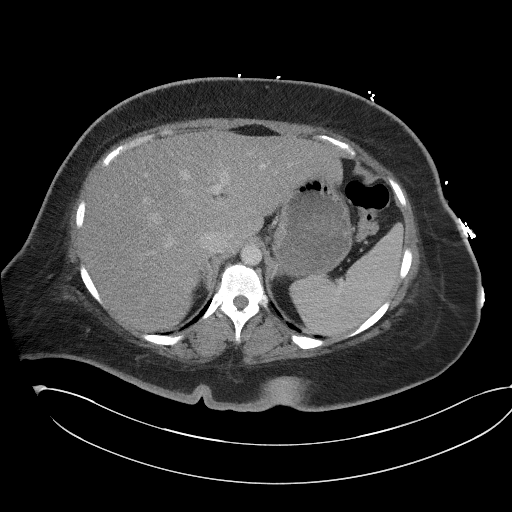
[im 88/102  soft-tissue]
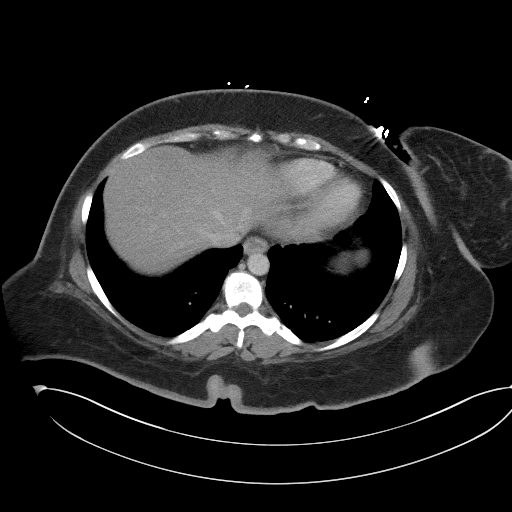
[im 97/102  soft-tissue]
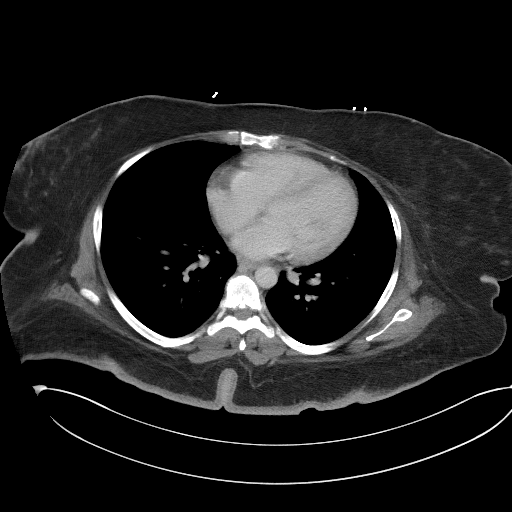

[Series 5: coronal st · coronal · 0.96mm/px · 3 of 107 slices shown]
[im 36/107  soft-tissue]
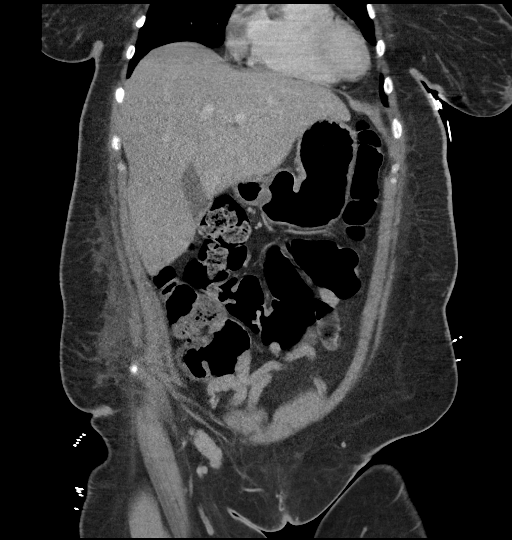
[im 48/107  soft-tissue]
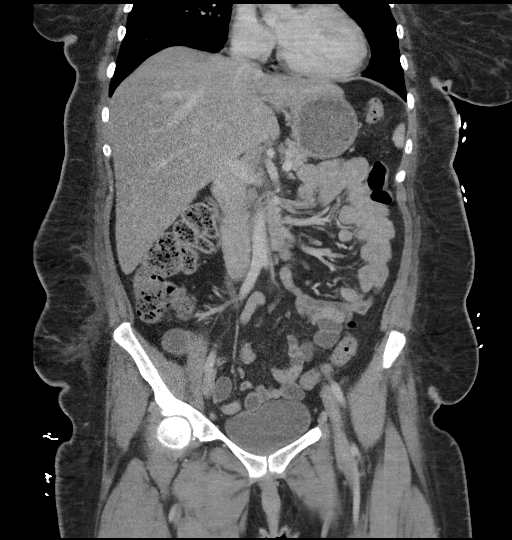
[im 59/107  soft-tissue]
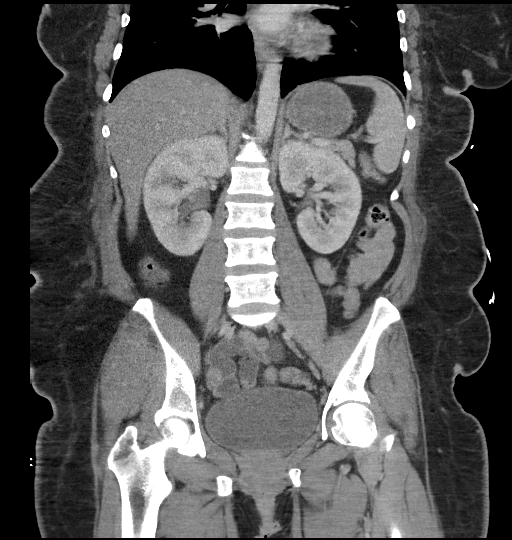

[16 of 46 positions shown; findings below may reference images not displayed]

FINDINGS: Lower chest: Linear atelectasis versus scarring within the left
lower lobe. Pulmonary micronodule within the lingula ([DATE]).
Pulmonary micronodule within the right lower lobe ([DATE]). Otherwise
no acute abnormality.

Hepatobiliary: Diffusely hypodense hepatic parenchyma compared to
the splenic parenchyma suggestive of hepatic steatosis. Layering
hyperdensity within the gallbladder lumen suggestive of sludge
versus tiny gallstones. No biliary ductal dilatation.

Pancreas: Unremarkable. No pancreatic ductal dilatation or
surrounding inflammatory changes.

Spleen: Normal in size without focal abnormality. A splenule is
identified.

Adrenals/Urinary Tract: No adrenal nodule bilaterally. Bilateral
kidneys enhance symmetrically. No hydronephrosis. No hydroureter.
The urinary bladder is unremarkable.

Stomach/Bowel: Stomach is within normal limits. Appendix appears
normal. No evidence of bowel wall thickening, distention, or
inflammatory changes.

Vascular/Lymphatic: No abdominal aorta or iliac aneurysm. No
abdominal, pelvic, or inguinal lymphadenopathy. Asymmetrically
prominent but nonenlarged inguinal lymph nodes on the right
measuring up to 1.1 cm ([DATE]) that are likely reactive in etiology.

Reproductive: Uterus and bilateral adnexa are unremarkable.

Other: No intraperitoneal free fluid. No intraperitoneal free gas.
No organized fluid collection.

Musculoskeletal:

Anterior mid to lower abdominal wall subcutaneous soft tissue edema,
right greater than left with no organized fluid collection but a
region of soft tissue density representing phlegmon formation
measuring up to 5 x 11 x 7 cm ([DATE], [DATE]). Several superficial foci
of subcutaneous soft tissue gas are associated with this area (
[DATE]).

No suspicious lytic or blastic osseous lesions. No acute displaced
fracture.
IMPRESSION: A 5 x 11 x 7 cm right mid to lower anterior abdominal wall phlegmon
formation with several associated foci of subcutaneous soft tissue
gas. No drainable organized fluid collection. No intra-abdominal
extension. Given findings and patient history, please note that a
necrotizing fasciitis is not excluded as this is a clinical
diagnosis.

No intra-abdominal or intrapelvic abnormality.

## 2022-01-08 ENCOUNTER — Emergency Department
Admission: EM | Admit: 2022-01-08 | Discharge: 2022-01-08 | Disposition: A | Discharge status: Home / Self Care | Payer: Self-pay | Attending: Emergency Medicine | Admitting: Emergency Medicine

## 2022-01-08 ENCOUNTER — Emergency Department: Payer: Self-pay

## 2022-01-08 ENCOUNTER — Other Ambulatory Visit: Payer: Self-pay

## 2022-01-08 DIAGNOSIS — E119 Type 2 diabetes mellitus without complications: Secondary | ICD-10-CM | POA: Insufficient documentation

## 2022-01-08 DIAGNOSIS — Y9389 Activity, other specified: Secondary | ICD-10-CM | POA: Diagnosis not present

## 2022-01-08 DIAGNOSIS — M25511 Pain in right shoulder: Secondary | ICD-10-CM | POA: Insufficient documentation

## 2022-01-08 DIAGNOSIS — Y92481 Parking lot as the place of occurrence of the external cause: Secondary | ICD-10-CM | POA: Diagnosis not present

## 2022-01-08 DIAGNOSIS — M542 Cervicalgia: Secondary | ICD-10-CM | POA: Insufficient documentation

## 2022-01-08 MED ORDER — LIDOCAINE 5 % EX PTCH: 1.0000 | MEDICATED_PATCH | Freq: Two times a day (BID) | CUTANEOUS | 0 refills | Status: AC

## 2022-01-08 MED ORDER — NAPROXEN 500 MG PO TABS: 500.0000 mg | ORAL_TABLET | Freq: Two times a day (BID) | ORAL | 0 refills | Status: AC
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
## 2022-01-08 NOTE — Discharge Instructions (Signed)
Your x-ray was normal.  Please return for any new, worsening, or change in symptoms or other concerns.  It was a pleasure caring for you today.

## 2022-04-06 ENCOUNTER — Emergency Department
Admission: EM | Admit: 2022-04-06 | Discharge: 2022-04-07 | Disposition: A | Discharge status: Home / Self Care | Payer: Self-pay | Attending: Emergency Medicine | Admitting: Emergency Medicine

## 2022-04-06 ENCOUNTER — Other Ambulatory Visit: Payer: Self-pay

## 2022-04-06 ENCOUNTER — Emergency Department: Payer: Self-pay

## 2022-04-06 DIAGNOSIS — L03213 Periorbital cellulitis: Secondary | ICD-10-CM | POA: Insufficient documentation

## 2022-04-06 DIAGNOSIS — L089 Local infection of the skin and subcutaneous tissue, unspecified: Secondary | ICD-10-CM

## 2022-04-06 DIAGNOSIS — L723 Sebaceous cyst: Secondary | ICD-10-CM | POA: Insufficient documentation

## 2022-04-06 DIAGNOSIS — H00034 Abscess of left upper eyelid: Secondary | ICD-10-CM | POA: Insufficient documentation

## 2022-04-06 LAB — CBC WITH DIFFERENTIAL/PLATELET
Abs Immature Granulocytes: 0.02 10*3/uL (ref 0.00–0.07)
Basophils Absolute: 0.1 10*3/uL (ref 0.0–0.1)
Basophils Relative: 1 %
Eosinophils Absolute: 0.5 10*3/uL (ref 0.0–0.5)
Eosinophils Relative: 5 %
HCT: 35.7 % — ABNORMAL LOW (ref 36.0–46.0)
Hemoglobin: 11.9 g/dL — ABNORMAL LOW (ref 12.0–15.0)
Immature Granulocytes: 0 %
Lymphocytes Relative: 31 %
Lymphs Abs: 3.3 10*3/uL (ref 0.7–4.0)
MCH: 27.5 pg (ref 26.0–34.0)
MCHC: 33.3 g/dL (ref 30.0–36.0)
MCV: 82.6 fL (ref 80.0–100.0)
Monocytes Absolute: 0.5 10*3/uL (ref 0.1–1.0)
Monocytes Relative: 5 %
Neutro Abs: 6.2 10*3/uL (ref 1.7–7.7)
Neutrophils Relative %: 58 %
Platelets: 290 10*3/uL (ref 150–400)
RBC: 4.32 MIL/uL (ref 3.87–5.11)
RDW: 12.6 % (ref 11.5–15.5)
WBC: 10.6 10*3/uL — ABNORMAL HIGH (ref 4.0–10.5)
nRBC: 0 % (ref 0.0–0.2)

## 2022-04-06 MED ORDER — DOXYCYCLINE HYCLATE 100 MG PO TABS
100.0000 mg | ORAL_TABLET | Freq: Once | ORAL | Status: AC
  Administered 2022-04-06: 100 mg via ORAL
  Filled 2022-04-06: qty 1

## 2022-04-06 MED ORDER — IOHEXOL 300 MG/ML  SOLN
75.0000 mL | Freq: Once | INTRAMUSCULAR | Status: AC | PRN
Start: 2022-04-06 — End: 2022-04-06
  Administered 2022-04-06: 75 mL via INTRAVENOUS

## 2022-04-06 MED ORDER — CLINDAMYCIN HCL 150 MG PO CAPS
300.0000 mg | ORAL_CAPSULE | Freq: Once | ORAL | Status: AC
  Administered 2022-04-06: 300 mg via ORAL
  Filled 2022-04-06: qty 2

## 2022-04-06 NOTE — ED Provider Notes (Addendum)
[facility] Provider Note  Patient Contact: 9:21 PM (approximate)   History   Abscess   HPI  Haley [name] is a 36 y.o. female who presents the emergency department complaining of 2 facial abscesses.  Patient states that she has had a history of hidradenitis suppurativa and has recurrent skin infections.  She is developed an area above the left eye and into the right cheek.  She states the right cheek has had a lesion to it every once in a while will also rule signs of infection.  The area to the left eye is new.  She states that she is on Bactrim currently but has only been on Bactrim for 48 hours.  Patient is requesting that we "lance them."  No fevers or chills.  No other reported skin changes     Physical Exam   Triage Vital Signs: ED Triage Vitals  Enc Vitals Group     BP 04/06/22 2053 (!) 144/91     Pulse Rate 04/06/22 2053 98     Resp 04/06/22 2053 18     Temp 04/06/22 2053 98.9 F (37.2 C)     Temp Source 04/06/22 2053 Oral     SpO2 04/06/22 2053 100 %     Weight --      Height --      Head Circumference --      Peak Flow --      Pain Score 04/06/22 2053 0     Pain Loc --      Pain Edu? --      Excl. in GC? --     Most recent vital signs: Vitals:   04/06/22 2053  BP: (!) 144/91  Pulse: 98  Resp: 18  Temp: 98.9 F (37.2 C)  SpO2: 100%     General: Alert and in no acute distress. Eyes:  PERRL. EOMI. visualization of the left thigh reveals edema and erythema of the left upper eyelid.  This area is tender to palpation.  No drainage from the area.  Patient has no infraorbital edema or erythema. Head: No acute traumatic findings.  Visualization of the right cheek reveals an erythematous edematous lesion.  Palpation reveals a well-circumscribed mobile lesion underlying this area concerning for cystic structure.  No fluctuance. ENT:      Ears:       Nose: No congestion/rhinnorhea.      Mouth/Throat: Mucous membranes are moist.   Cardiovascular:  Good peripheral perfusion Respiratory: Normal respiratory effort without tachypnea or retractions. Lungs CTAB.  Musculoskeletal: Full range of motion to all extremities.  Neurologic:  No gross focal neurologic deficits are appreciated.  Skin:   No rash noted Other:   ED Results / Procedures / Treatments   Labs (all labs ordered are listed, but only abnormal results are displayed) Labs Reviewed  CBC WITH DIFFERENTIAL/PLATELET - Abnormal; Notable for the following components:      Result Value   WBC 10.6 (*)    Hemoglobin 11.9 (*)    HCT 35.7 (*)    All other components within normal limits  COMPREHENSIVE METABOLIC PANEL     EKG     RADIOLOGY  I personally viewed, evaluated, and interpreted these images as part of my medical decision making, as well as reviewing the written report by the radiologist.  ED Provider Interpretation: Visualization of CT scan reveals small abscess in the left eyelid measuring 14 mm in diameter.  Stranding consistent with surrounding cellulitis.  No evidence of  orbital cellulitis.  Patient has a area in the right cheek noted as potential abscess.  Ultrasound at bedside revealed cystic structure in this region.  CT Maxillofacial W Contrast  Result Date: 04/06/2022 CLINICAL DATA:  Facial abscess, left periorbital edema. EXAM: CT MAXILLOFACIAL WITH CONTRAST TECHNIQUE: Multidetector CT imaging of the maxillofacial structures was performed with intravenous contrast. Multiplanar CT image reconstructions were also generated. RADIATION DOSE REDUCTION: This exam was performed according to the departmental dose-optimization program which includes automated exposure control, adjustment of the mA and/or kV according to patient size and/or use of iterative reconstruction technique. CONTRAST:  72mL OMNIPAQUE IOHEXOL 300 MG/ML  SOLN COMPARISON:  12/16/2018 CT head FINDINGS: Osseous: No fracture or mandibular dislocation. No destructive process.  Orbits: No traumatic or inflammatory finding within the orbit. In the left eyelid, there is a 7 x 14 x 8 mm peripherally enhancing collection (AP x TR x CC) (series 2, image 22 and series 5, image 61), concerning for abscess, with surrounding soft tissue hyperenhancement and fat stranding; however no inflammatory findings extend into the left orbit. Sinuses: Clear. Soft tissues: In addition to the left eyelid abscess, described above, there is a 6 x 8 x 6 mm peripherally enhancing collection in the superficial right premalar soft tissues (series 2, image 41 and series 5, image 18), also concerning for abscess. Limited intracranial: No significant or unexpected finding. IMPRESSION: 1. Small abscess in the left eyelid, measuring up to 14 mm in greatest dimension with surrounding inflammatory changes but no evidence of intraorbital involvement 2. Small abscess in the right premalar soft tissues, measuring up to 8 mm. Electronically Signed   By: Wiliam Ke M.D.   On: 04/06/2022 23:58    PROCEDURES:  Critical Care performed: No  Ultrasound ED Soft Tissue  Date/Time: 04/06/2022 9:26 PM  Performed by: Racheal Patches, PA-C Authorized by: Racheal Patches, PA-C   Procedure details:    Indications: localization of abscess and evaluate for cellulitis     Transverse view:  Visualized   Longitudinal view:  Visualized   Images: not archived   Location:    Location: face     Side:  Right Findings:     no abscess present    cellulitis present    no foreign body present Comments:     Visualization of the right cheek reveals no evidence of abscess.  Patient has a cystic structure in the soft tissue with overlying stranding consistent with cellulitis .Marland KitchenIncision and Drainage  Date/Time: 04/07/2022 12:30 AM  Performed by: Racheal Patches, PA-C Authorized by: Racheal Patches, PA-C   Consent:    Consent obtained:  Verbal   Consent given by:  Patient   Risks discussed:   Bleeding, incomplete drainage and pain Universal protocol:    Procedure explained and questions answered to patient or proxy's satisfaction: yes     Immediately prior to procedure, a time out was called: yes     Patient identity confirmed:  Verbally with patient Location:    Type:  Abscess   Location:  Head   Head location:  L eyelid Pre-procedure details:    Skin preparation:  Chlorhexidine with alcohol Anesthesia:    Anesthesia method:  Local infiltration Procedure type:    Complexity:  Simple Procedure details:    Ultrasound guidance: no     Needle aspiration: yes     Needle size:  18 G   Drainage:  Purulent   Drainage amount:  Scant   Packing materials:  None Post-procedure details:    Procedure completion:  Tolerated well, no immediate complications    MEDICATIONS ORDERED IN ED: Medications  doxycycline (VIBRA-TABS) tablet 100 mg (100 mg Oral Given 04/06/22 2132)  clindamycin (CLEOCIN) capsule 300 mg (300 mg Oral Given 04/06/22 2132)  iohexol (OMNIPAQUE) 300 MG/ML solution 75 mL (75 mLs Intravenous Contrast Given 04/06/22 2308)  lidocaine (PF) (XYLOCAINE) 1 % injection 10 mL (10 mLs Infiltration Given 04/07/22 0045)     IMPRESSION / MDM / ASSESSMENT AND PLAN / ED COURSE  I reviewed the triage vital signs and the nursing notes.                              Differential diagnosis includes, but is not limited to, facial abscess, eyelid abscess, presenting cellulitis, orbital cellulitis  Patient's presentation is most consistent with acute presentation with potential threat to life or bodily function.   Patient's diagnosis is consistent with preseptal cellulitis with eyelid abscess, infected sebaceous cyst of the cheek.  Patient presents the emergency department 2 areas of infection.  Patient is concerned primarily for the left upper eyelid.  CT scan revealed preseptal cellulitis with no evidence of orbital cellulitis, small 14 mm in diameter abscess.  This is sometimes  locally and an 18-gauge is used to aspirate a scant amount of purulent material.  Patient tolerated well.  Patient has an area to the right cheek that was noted on CT to be a potential abscess but bedside ultrasound revealed findings consistent with infected sebaceous cyst.  Patient was recently started on Bactrim but uses this regularly as she has a history of hidradenitis suppurativa.  As such I have the patient's stop the Bactrim and we will place her on doxycycline and clindamycin.  She will take probiotics while using the clindamycin.  Return precautions discussed with patient.  Patient is stable for discharge at this time..  Patient is given ED precautions to return to the ED for any worsening or new symptoms.   Addendum: At the end of the visit, we received a phone call from the patient's mother who advised that the patient had talked about cutting her wrists in a suicide attempt.  I had a lengthy discussion with the patient who denies any suicidal or homicidal ideations.  She has no history of suicide attempt.  Patient had a brief period of psychosis in 2020 was evaluated by psych but still had no suicidal homicidal ideations at the time.  Patient has no history of cutting.  At this time I see no evidence as patient appears competent to make her own decisions, is having a a full and intelligent conversation regarding current medical complaints as well as this report.  At this time I see no evidence to IVC the patient.         FINAL CLINICAL IMPRESSION(S) / ED DIAGNOSES   Final diagnoses:  Preseptal cellulitis of left upper eyelid  Abscess of left upper eyelid  Infected sebaceous cyst     Rx / DC Orders   ED Discharge Orders          Ordered    clindamycin (CLEOCIN) 300 MG capsule  4 times daily        04/07/22 0034    doxycycline (VIBRA-TABS) 100 MG tablet  2 times daily        04/07/22 0034    fluconazole (DIFLUCAN) 150 MG tablet   Once  04/07/22 0034              Note:  This document was prepared using Dragon voice recognition software and may include unintentional dictation errors.   Racheal Patches, PA-C 04/07/22 0035    Racheal Patches, PA-C 04/07/22 [id number]    Phineas Semen, MD 04/13/22 1747

## 2022-04-06 NOTE — ED Triage Notes (Signed)
Pt c/o 2 abscesses on face that started forming 2 weeks ago and have not improved with hot compresses and bactrim. Pt requesting incision and drainage. Pt AOX4, NAD noted. Abscess on left eyelid and right cheek.

## 2022-04-07 MED ORDER — DOXYCYCLINE HYCLATE 100 MG PO TABS: 100.0000 mg | ORAL_TABLET | Freq: Two times a day (BID) | ORAL | 0 refills | Status: DC

## 2022-04-07 MED ORDER — CLINDAMYCIN HCL 300 MG PO CAPS: 300.0000 mg | ORAL_CAPSULE | Freq: Four times a day (QID) | ORAL | 0 refills | Status: AC

## 2022-04-07 MED ORDER — FLUCONAZOLE 150 MG PO TABS: 150.0000 mg | ORAL_TABLET | Freq: Once | ORAL | 0 refills | Status: AC

## 2022-04-07 MED ORDER — LIDOCAINE HCL (PF) 1 % IJ SOLN
10.0000 mL | Freq: Once | INTRAMUSCULAR | Status: AC
  Administered 2022-04-07: 10 mL
  Filled 2022-04-07: qty 10

## 2022-04-07 NOTE — ED Provider Notes (Incomplete)
[facility] Provider Note  Patient Contact: 9:21 PM (approximate)   History   Abscess   HPI  Haley [name] is a 36 y.o. female who presents the emergency department complaining of 2 facial abscesses.  Patient states that she has had a history of hidradenitis suppurativa and has recurrent skin infections.  She is developed an area above the left eye and into the right cheek.  She states the right cheek has had a lesion to it every once in a while will also rule signs of infection.  The area to the left eye is new.  She states that she is on Bactrim currently but has only been on Bactrim for 48 hours.  Patient is requesting that we "lance them."  No fevers or chills.  No other reported skin changes     Physical Exam   Triage Vital Signs: ED Triage Vitals  Enc Vitals Group     BP 04/06/22 2053 (!) 144/91     Pulse Rate 04/06/22 2053 98     Resp 04/06/22 2053 18     Temp 04/06/22 2053 98.9 F (37.2 C)     Temp Source 04/06/22 2053 Oral     SpO2 04/06/22 2053 100 %     Weight --      Height --      Head Circumference --      Peak Flow --      Pain Score 04/06/22 2053 0     Pain Loc --      Pain Edu? --      Excl. in GC? --     Most recent vital signs: Vitals:   04/06/22 2053  BP: (!) 144/91  Pulse: 98  Resp: 18  Temp: 98.9 F (37.2 C)  SpO2: 100%     General: Alert and in no acute distress. Eyes:  PERRL. EOMI. visualization of the left thigh reveals edema and erythema of the left upper eyelid.  This area is tender to palpation.  No drainage from the area.  Patient has no infraorbital edema or erythema. Head: No acute traumatic findings.  Visualization of the right cheek reveals an erythematous edematous lesion.  Palpation reveals a well-circumscribed mobile lesion underlying this area concerning for cystic structure.  No fluctuance. ENT:      Ears:       Nose: No congestion/rhinnorhea.      Mouth/Throat: Mucous membranes are moist.   Cardiovascular:  Good peripheral perfusion Respiratory: Normal respiratory effort without tachypnea or retractions. Lungs CTAB.  Musculoskeletal: Full range of motion to all extremities.  Neurologic:  No gross focal neurologic deficits are appreciated.  Skin:   No rash noted Other:   ED Results / Procedures / Treatments   Labs (all labs ordered are listed, but only abnormal results are displayed) Labs Reviewed  CBC WITH DIFFERENTIAL/PLATELET - Abnormal; Notable for the following components:      Result Value   WBC 10.6 (*)    Hemoglobin 11.9 (*)    HCT 35.7 (*)    All other components within normal limits  COMPREHENSIVE METABOLIC PANEL     EKG     RADIOLOGY  I personally viewed, evaluated, and interpreted these images as part of my medical decision making, as well as reviewing the written report by the radiologist.  ED Provider Interpretation: Visualization of CT scan reveals small abscess in the left eyelid measuring 14 mm in diameter.  Stranding consistent with surrounding cellulitis.  No evidence of  orbital cellulitis.  Patient has a area in the right cheek noted as potential abscess.  Ultrasound at bedside revealed cystic structure in this region.  No results found.  PROCEDURES:  Critical Care performed: No  Ultrasound ED Soft Tissue  Date/Time: 04/06/2022 9:26 PM  Performed by: Darletta Moll, PA-C Authorized by: Darletta Moll, PA-C   Procedure details:    Indications: localization of abscess and evaluate for cellulitis     Transverse view:  Visualized   Longitudinal view:  Visualized   Images: not archived   Location:    Location: face     Side:  Right Findings:     no abscess present    cellulitis present    no foreign body present Comments:     Visualization of the right cheek reveals no evidence of abscess.  Patient has a cystic structure in the soft tissue with overlying stranding consistent with cellulitis    MEDICATIONS  ORDERED IN ED: Medications  doxycycline (VIBRA-TABS) tablet 100 mg (100 mg Oral Given 04/06/22 2132)  clindamycin (CLEOCIN) capsule 300 mg (300 mg Oral Given 04/06/22 2132)  iohexol (OMNIPAQUE) 300 MG/ML solution 75 mL (75 mLs Intravenous Contrast Given 04/06/22 2308)     IMPRESSION / MDM / ASSESSMENT AND PLAN / ED COURSE  I reviewed the triage vital signs and the nursing notes.                              Differential diagnosis includes, but is not limited to, ***  {**The patient is on the cardiac monitor to evaluate for evidence of arrhythmia and/or significant heart rate changes.**}  Patient's presentation is most consistent with {EM COPA:27473}   Patient's diagnosis is consistent with ***. Patient will be discharged home with prescriptions for ***. Patient is to follow up with *** as needed or otherwise directed. Patient is given ED precautions to return to the ED for any worsening or new symptoms.  {Remember to include, when applicable, any/all of the following data: independent review of imaging independent review of labs (comment specifically on pertinent positives and negatives) review of specific prior hospitalizations, PCP/specialist notes, etc. discuss meds given and prescribed document any discussion with consultants (including hospitalists) any clinical decision tools you used and why (PECARN, NEXUS, etc.) did you consider admitting the patient? document social determinants of health affecting patient's care (homelessness, inability to follow up in a timely fashion, etc) document any pre-existing conditions increasing risk on current visit (e.g. diabetes and HTN increasing danger of high-risk chest pain/ACS) describes what meds you gave (especially parenteral) and why any other interventions?:1}      FINAL CLINICAL IMPRESSION(S) / ED DIAGNOSES   Final diagnoses:  None     Rx / DC Orders   ED Discharge Orders     None        Note:  This document was  prepared using Dragon voice recognition software and may include unintentional dictation errors.

## 2022-04-07 NOTE — ED Notes (Signed)
Received call from charge nurse who reported that the patients mother had called the ED and stated that her daughter had threatened self harm. Provider notified and followed up with the patient. Pt denied any SI/HI.

## 2022-06-05 IMAGING — CT CT ABD-PELV W/ CM
2 of 4 series · 16 of 46 positions shown, 18 images · IV contrast (APPLIED)
Comparison: CT 02/08/2020

CLINICAL DATA: Abdomen pain

EXAM:
CT ABDOMEN AND PELVIS WITH CONTRAST
TECHNIQUE: Multidetector CT imaging of the abdomen and pelvis was performed
using the standard protocol following bolus administration of
intravenous contrast.
CONTRAST:  100mL OMNIPAQUE IOHEXOL 300 MG/ML  SOLN

[Series 2: axial st · axial · 0.93mm/px · z∈[-995,-575]mm · 13 of 92 slices shown, 15 images]
[im 4/92  soft-tissue]
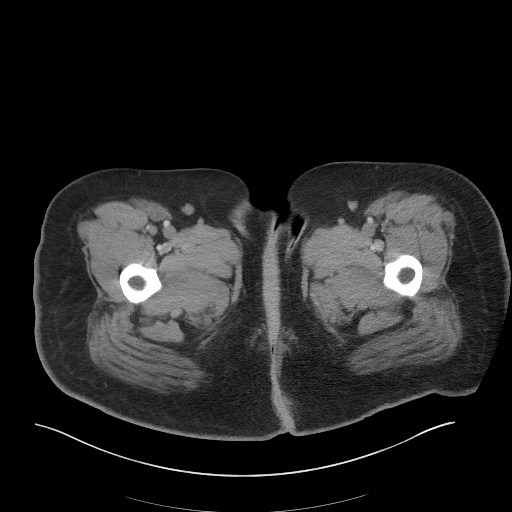
[im 4/92  bone]
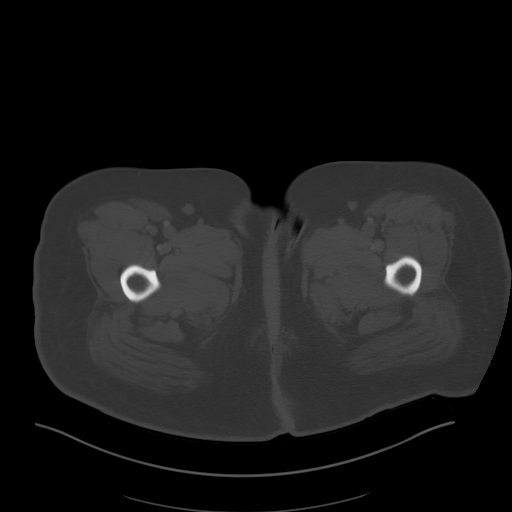
[im 12/92  soft-tissue]
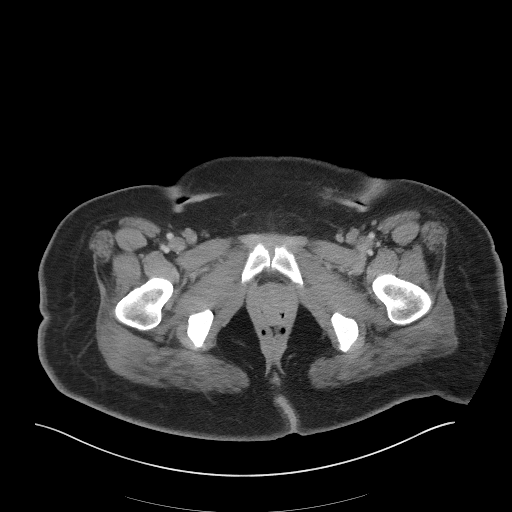
[im 19/92  soft-tissue]
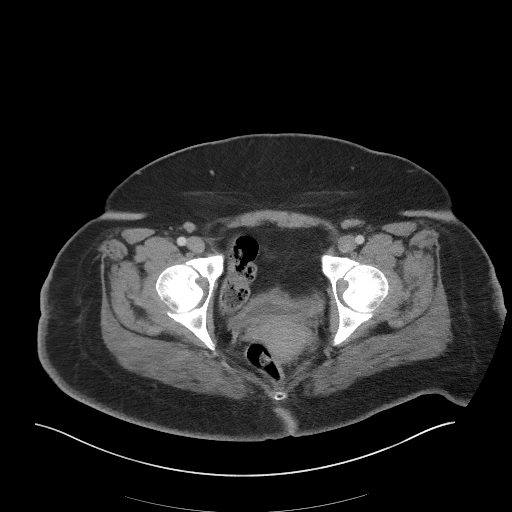
[im 27/92  soft-tissue]
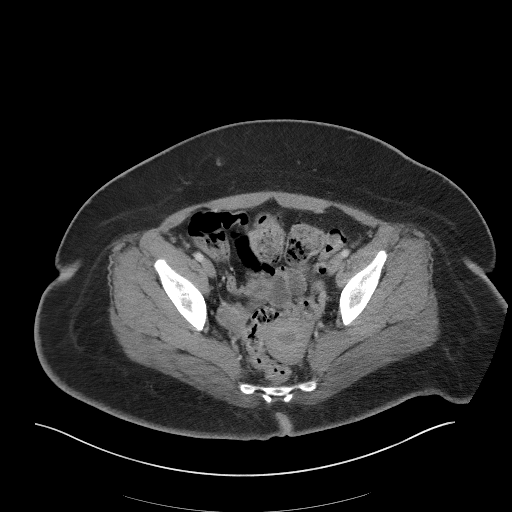
[im 31/92  soft-tissue]
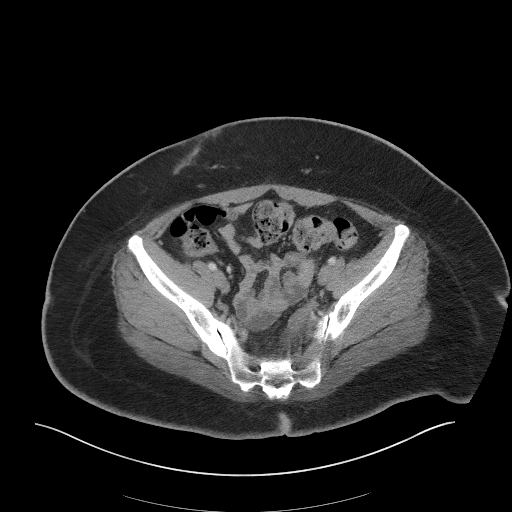
[im 38/92  soft-tissue]
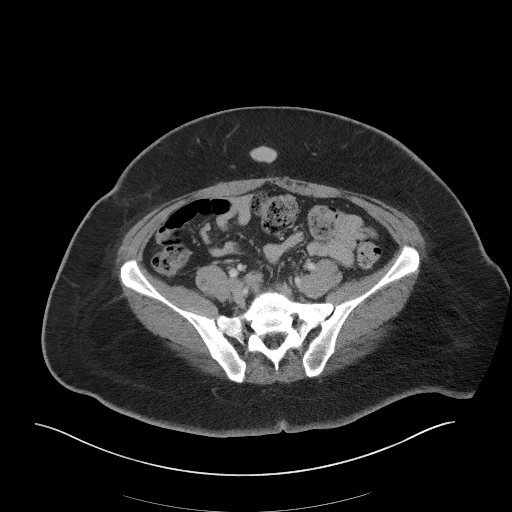
[im 46/92  soft-tissue]
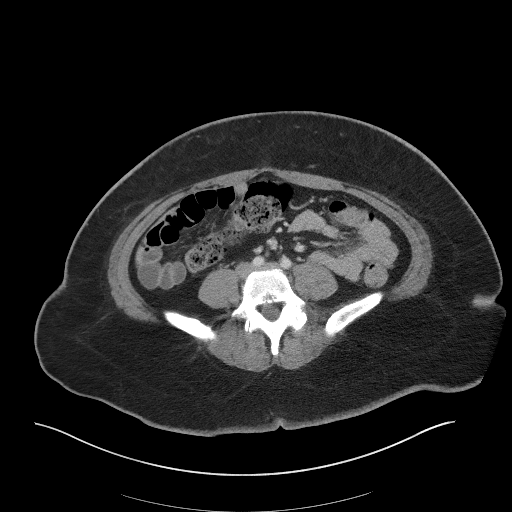
[im 54/92  soft-tissue]
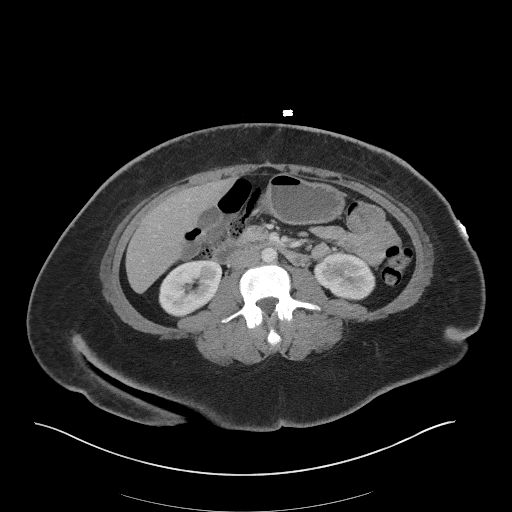
[im 61/92  soft-tissue]
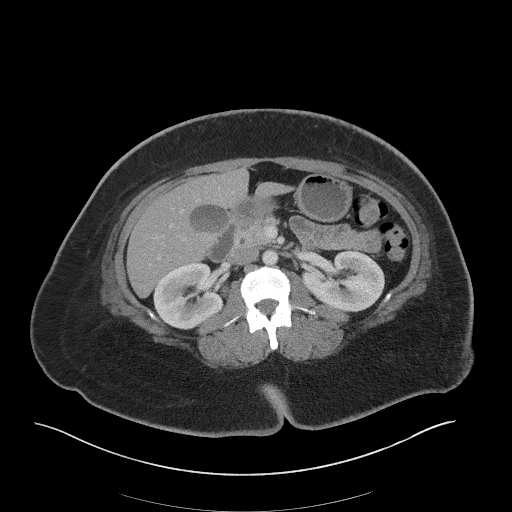
[im 61/92  bone]
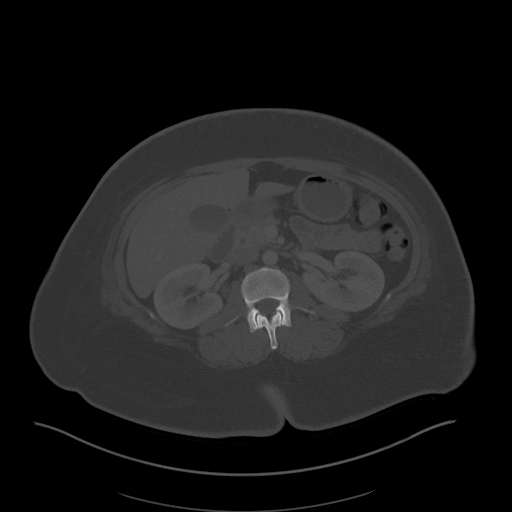
[im 65/92  soft-tissue]
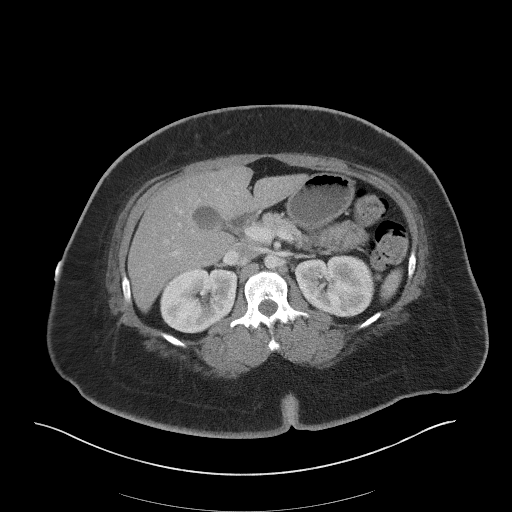
[im 73/92  soft-tissue]
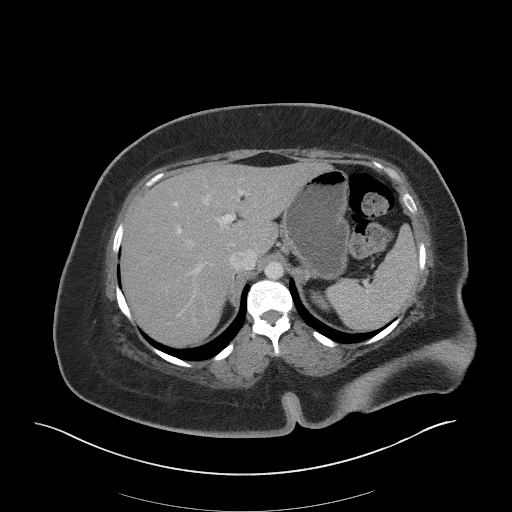
[im 80/92  soft-tissue]
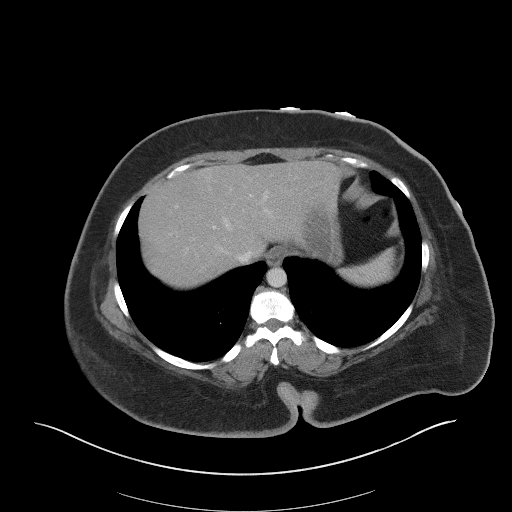
[im 88/92  soft-tissue]
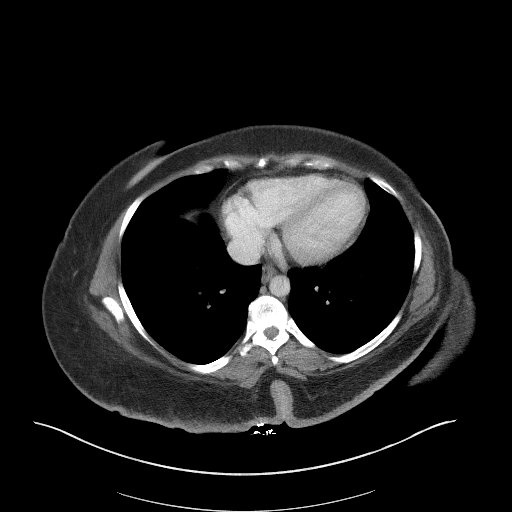

[Series 5: coronal st · coronal · 0.74mm/px · 3 of 101 slices shown]
[im 34/101  soft-tissue]
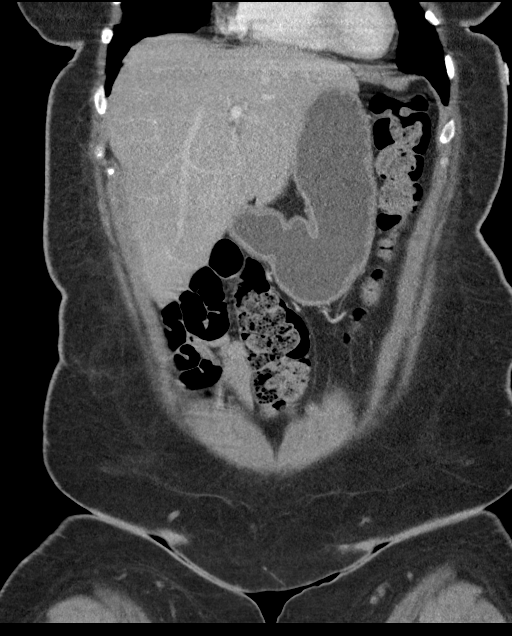
[im 45/101  soft-tissue]
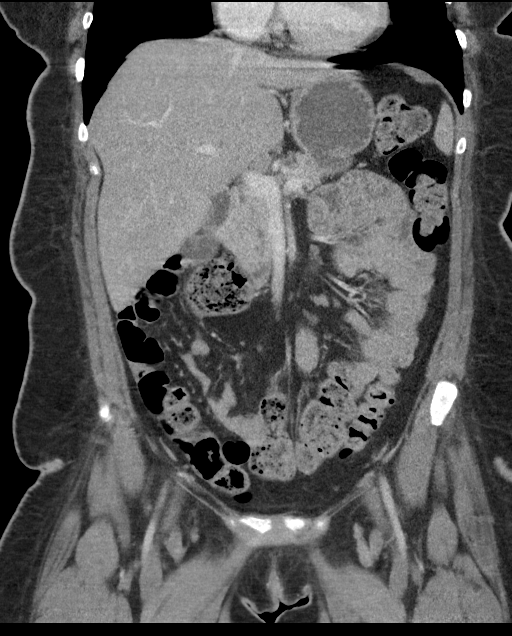
[im 56/101  soft-tissue]
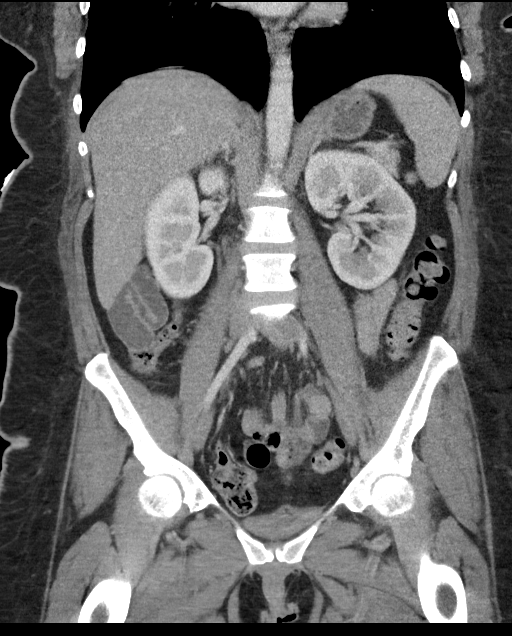

[16 of 46 positions shown; findings below may reference images not displayed]

FINDINGS: Lower chest: No acute abnormality.

Hepatobiliary: No focal liver abnormality is seen. No gallstones,
gallbladder wall thickening, or biliary dilatation.

Pancreas: Unremarkable. No pancreatic ductal dilatation or
surrounding inflammatory changes.

Spleen: Normal in size without focal abnormality.

Adrenals/Urinary Tract: Adrenal glands are unremarkable. Kidneys are
normal, without renal calculi, focal lesion, or hydronephrosis.
Bladder is unremarkable.

Stomach/Bowel: Stomach is within normal limits. Appendix not well
seen but no right lower quadrant inflammatory process. No evidence
of bowel wall thickening, distention, or inflammatory changes.

Vascular/Lymphatic: No significant vascular findings are present. No
enlarged abdominal or pelvic lymph nodes.

Reproductive: Uterus and bilateral adnexa are unremarkable.

Other: No abdominal wall hernia or abnormality. No abdominopelvic
ascites.

Musculoskeletal: No acute or significant osseous findings.
IMPRESSION: Negative. No CT evidence for acute intra-abdominal or pelvic
abnormality.

## 2022-09-21 ENCOUNTER — Encounter: Payer: Self-pay | Admitting: Emergency Medicine

## 2022-09-21 ENCOUNTER — Emergency Department
Admission: EM | Admit: 2022-09-21 | Discharge: 2022-09-21 | Disposition: A | Discharge status: Home / Self Care | Payer: Medicaid - Out of State | Attending: Emergency Medicine | Admitting: Emergency Medicine

## 2022-09-21 DIAGNOSIS — L02211 Cutaneous abscess of abdominal wall: Secondary | ICD-10-CM | POA: Diagnosis not present

## 2022-09-21 DIAGNOSIS — L0291 Cutaneous abscess, unspecified: Secondary | ICD-10-CM

## 2022-09-21 MED ORDER — FLUCONAZOLE 150 MG PO TABS: 150.0000 mg | ORAL_TABLET | Freq: Once | ORAL | 0 refills | Status: AC

## 2022-09-21 MED ORDER — LIDOCAINE HCL (PF) 1 % IJ SOLN
5.0000 mL | Freq: Once | INTRAMUSCULAR | Status: AC
  Administered 2022-09-21: 5 mL via INTRADERMAL
  Filled 2022-09-21: qty 5

## 2022-09-21 MED ORDER — CLINDAMYCIN HCL 150 MG PO CAPS
450.0000 mg | ORAL_CAPSULE | Freq: Once | ORAL | Status: AC
  Administered 2022-09-21: 450 mg via ORAL
  Filled 2022-09-21: qty 3

## 2022-09-21 MED ORDER — CLINDAMYCIN HCL 150 MG PO CAPS: 450.0000 mg | ORAL_CAPSULE | Freq: Three times a day (TID) | ORAL | 0 refills | Status: AC

## 2022-09-21 MED ORDER — CLINDAMYCIN HCL 150 MG PO CAPS: 300.0000 mg | ORAL_CAPSULE | Freq: Once | ORAL | Status: DC

## 2022-09-21 NOTE — ED Notes (Signed)
ED Provider at bedside. 

## 2022-09-21 NOTE — ED Notes (Signed)
Pt taken to the room ; primary RN made aware of the patients arrival.   

## 2022-09-21 NOTE — ED Provider Notes (Signed)
[facility] Provider Note    Event Date/Time   First MD Initiated Contact with Patient 09/21/22 [phone]     (approximate)   History   Abscess   HPI Haley [name] is a 37 y.o. female whose medical history includes hidradenitis suppurativa.  She presents for evaluation of a lesion on the left upper part of her abdomen that has been gradually getting worse over the last couple weeks but seems to have gotten significantly worse over the last few days.  She said it does not seem like her normal hydradenitis lesions that she has had in her groin and armpits in the past.  Sometimes it feels warm but not all the time.  It has gotten quite a bit bigger recently and she said that it feels tight and like it is putting pressure on her skin.  She has not had any fever, chills, or other infectious signs or symptoms.     Physical Exam   Triage Vital Signs: ED Triage Vitals  Enc Vitals Group     BP 09/21/22 0340 (!) 140/79     Pulse Rate 09/21/22 0340 90     Resp 09/21/22 0340 18     Temp 09/21/22 0340 97.8 F (36.6 C)     Temp Source 09/21/22 0340 Oral     SpO2 09/21/22 0340 99 %     Weight 09/21/22 0338 107.2 kg (236 lb 5.3 oz)     Height 09/21/22 0338 1.651 m (5\' 5" )     Head Circumference --      Peak Flow --      Pain Score 09/21/22 0340 6     Pain Loc --      Pain Edu? --      Excl. in GC? --     Most recent vital signs: Vitals:   09/21/22 0340 09/21/22 0500  BP: (!) 140/79 (!) 123/98  Pulse: 90 79  Resp: 18 19  Temp: 97.8 F (36.6 C)   SpO2: 99% 98%    General: Awake, no distress.  CV:  Good peripheral perfusion.  Resp:  Normal effort. Speaking easily and comfortably, no accessory muscle usage nor intercostal retractions.   Abd:  No distention.  Other:  Patient has a relatively large area of induration with an area of fluctuance about 6 cm x 4 cm on her left upper abdomen.  It is minimally tender to palpation and well-circumscribed.  It appears to be  a bit deeper than a typical cutaneous abscess might be if caused by an external penetrating source.   ED Results / Procedures / Treatments   Labs (all labs ordered are listed, but only abnormal results are displayed) Labs Reviewed  AEROBIC/ANAEROBIC CULTURE W GRAM STAIN (SURGICAL/DEEP WOUND)        PROCEDURES:  Critical Care performed: No  ..Incision and Drainage  Date/Time: 09/21/2022 5:30 AM  Performed by: Loleta Rose, MD Authorized by: Loleta Rose, MD   Consent:    Consent obtained:  Verbal   Consent given by:  Patient   Risks discussed:  Bleeding, infection, incomplete drainage and pain   Alternatives discussed:  Alternative treatment, delayed treatment and observation Universal protocol:    Patient identity confirmed:  Verbally with patient Location:    Type:  Abscess   Size:  6x4cm   Location:  Trunk   Trunk location:  Abdomen Pre-procedure details:    Procedure prep: alcohol. Sedation:    Sedation type:  None Anesthesia:  Anesthesia method:  Local infiltration   Local anesthetic:  Lidocaine 1% w/o epi Procedure type:    Complexity:  Simple Procedure details:    Ultrasound guidance: yes     Needle aspiration: yes     Needle size:  18 G   Drainage:  Purulent and bloody   Drainage amount:  Copious   Wound treatment:  Wound left open Post-procedure details:    Procedure completion:  Tolerated well, no immediate complications     IMPRESSION / MDM / ASSESSMENT AND PLAN / ED COURSE  I reviewed the triage vital signs and the nursing notes.                              Differential diagnosis includes, but is not limited to, abscess, hematoma, sebaceous cyst, lipoma.  Patient's presentation is most consistent with acute, uncomplicated illness.  Labs/studies ordered: Wound culture  Interventions/Medications given:  Medications  lidocaine (PF) (XYLOCAINE) 1 % injection 5 mL (5 mLs Intradermal Given by Other 09/21/22 0500)  clindamycin (CLEOCIN)  capsule 450 mg (450 mg Oral Given 09/21/22 0518)    (Note:  hospital course my include additional interventions and/or labs/studies not listed above.)   I talked with the patient about various options.  I did not initially think this was likely to be an abscess and I was suspicious of lipoma.  I performed a bedside ultrasound and could see a well-circumscribed lesion filled with material that appeared fairly homogenous.  However the patient said it was very uncomfortable and putting pressure on the area and she would feel more comfortable if I aspirated it.  I talked with her about aspiration versus incision and drainage, and her preference was to aspirate some of the material to decrease its size.  She understands that it will likely not completely resolve as result of the aspiration but it is her preference.  I drained 11 mL of primarily purulent but also bloody material from the lesion.  Ultrasound evaluation after the procedure did not reveal any residual drainable pockets of material.  I provided a dose of clindamycin in the emergency department and a prescription for clindamycin, which the patient said that she has taken successfully and without significant side effects in the past, to try and prevent worsening or reinfection.  I strongly encouraged outpatient follow-up even though she has some issues with insurance and I gave my usual and customary return precautions.   As per her request, I also provided a prescription for fluconazole because she has had yeast infections in the past after taking antibiotics.      FINAL CLINICAL IMPRESSION(S) / ED DIAGNOSES   Final diagnoses:  Abscess     Rx / DC Orders   ED Discharge Orders          Ordered    clindamycin (CLEOCIN) 150 MG capsule  3 times daily        09/21/22 0508    fluconazole (DIFLUCAN) 150 MG tablet   Once        09/21/22 0533             Note:  This document was prepared using Dragon voice recognition software and  may include unintentional dictation errors.   Loleta Rose, MD 09/21/22 [phone]

## 2022-09-21 NOTE — ED Triage Notes (Signed)
Pt presents ambulatory to triage via POV with complaints of an abscess on her L side of her abdomen x 2 weeks. No drainage or redness. She notes it resembles a pimple but wont go away. A&Ox4 at this time. Denies fevers, chills,  CP or SOB.

## 2022-09-21 NOTE — Discharge Instructions (Signed)
The wound clean and dry.  Take the full course of antibiotics as prescribed.  If you develop new or worsening symptoms that concern you, please return to the emergency department.

## 2022-09-24 ENCOUNTER — Telehealth: Payer: Self-pay

## 2022-09-24 NOTE — Transitions of Care (Post Inpatient/ED Visit) (Signed)
   09/24/2022  Name: Haley [name] MRN: [id number] DOB: 1985/09/06  Today's TOC FU Call Status: Today's TOC FU Call Status:: Unsuccessul Call (1st Attempt) Unsuccessful Call (1st Attempt) Date: 09/24/22  Attempted to reach the patient regarding the most recent Inpatient/ED visit.  Follow Up Plan: Additional outreach attempts will be made to reach the patient to complete the Transitions of Care (Post Inpatient/ED visit) call.   Signature Elisha Ponder LPN [facility] AWV Team Direct dial:  [phone]

## 2022-09-25 NOTE — Transitions of Care (Post Inpatient/ED Visit) (Signed)
Unable to reach pt by phone and left v/m for pt to cb.      09/25/2022  Name: Haley [name] MRN: [id number] DOB: 1985/07/18  Today's TOC FU Call Status: Today's TOC FU Call Status:: Unsuccessful Call (2nd Attempt) Unsuccessful Call (1st Attempt) Date: 09/24/22 Unsuccessful Call (2nd Attempt) Date: 09/25/22  Attempted to reach the patient regarding the most recent Inpatient/ED visit.  Follow Up Plan: Additional outreach attempts will be made to reach the patient to complete the Transitions of Care (Post Inpatient/ED visit) call.   Signature Lewanda Rife, LPN

## 2022-09-26 LAB — AEROBIC/ANAEROBIC CULTURE W GRAM STAIN (SURGICAL/DEEP WOUND)

## 2022-09-28 ENCOUNTER — Emergency Department (HOSPITAL_COMMUNITY)
Admission: EM | Admit: 2022-09-28 | Discharge: 2022-09-28 | Disposition: A | Discharge status: Home / Self Care | Payer: Medicaid - Out of State | Attending: Emergency Medicine | Admitting: Emergency Medicine

## 2022-09-28 ENCOUNTER — Other Ambulatory Visit: Payer: Self-pay

## 2022-09-28 ENCOUNTER — Encounter (HOSPITAL_COMMUNITY): Payer: Self-pay

## 2022-09-28 DIAGNOSIS — E119 Type 2 diabetes mellitus without complications: Secondary | ICD-10-CM | POA: Insufficient documentation

## 2022-09-28 DIAGNOSIS — L0291 Cutaneous abscess, unspecified: Secondary | ICD-10-CM

## 2022-09-28 DIAGNOSIS — L02211 Cutaneous abscess of abdominal wall: Secondary | ICD-10-CM | POA: Insufficient documentation

## 2022-09-28 DIAGNOSIS — Z794 Long term (current) use of insulin: Secondary | ICD-10-CM | POA: Insufficient documentation

## 2022-09-28 DIAGNOSIS — Z7984 Long term (current) use of oral hypoglycemic drugs: Secondary | ICD-10-CM | POA: Diagnosis not present

## 2022-09-28 MED ORDER — DOXYCYCLINE HYCLATE 100 MG PO TABS
100.0000 mg | ORAL_TABLET | Freq: Once | ORAL | Status: AC
  Administered 2022-09-28: 100 mg via ORAL
  Filled 2022-09-28: qty 1

## 2022-09-28 MED ORDER — DOXYCYCLINE HYCLATE 100 MG PO CAPS: 100.0000 mg | ORAL_CAPSULE | Freq: Two times a day (BID) | ORAL | 0 refills | Status: AC

## 2022-09-28 MED ORDER — LIDOCAINE-EPINEPHRINE (PF) 2 %-1:200000 IJ SOLN
20.0000 mL | Freq: Once | INTRAMUSCULAR | Status: AC
  Administered 2022-09-28: 20 mL
  Filled 2022-09-28: qty 20

## 2022-09-28 NOTE — Discharge Instructions (Addendum)
You have been seen today for your complaint of abscess. Your discharge medications include doxycycline. This is an antibiotic. You should take it as prescribed. You should take it for the entire duration of the prescription. This may cause an upset stomach. This is normal. You may take this with food. You may also eat yogurt to prevent diarrhea. Home care instructions are as follows:  Keep the area clean and dry  Follow up with: a primary care provider of your choice in 5 to 7 days for reevaluation Please seek immediate medical care if you develop any of the following symptoms: You see red streaks on your skin near the abscess. You have any signs of worse infection. You vomit every time you eat or drink. You have a fever, chills, or muscle aches. The cyst or abscess comes back. At this time there does not appear to be the presence of an emergent medical condition, however there is always the potential for conditions to change. Please read and follow the below instructions.  Do not take your medicine if  develop an itchy rash, swelling in your mouth or lips, or difficulty breathing; call 911 and seek immediate emergency medical attention if this occurs.  You may review your lab tests and imaging results in their entirety on your MyChart account.  Please discuss all results of fully with your primary care provider and other specialist at your follow-up visit.  Note: Portions of this text may have been transcribed using voice recognition software. Every effort was made to ensure accuracy; however, inadvertent computerized transcription errors may still be present.

## 2022-09-28 NOTE — ED Triage Notes (Signed)
C/O abscess to LUQ of abdomen x3 weeks.  Redness and heat noted in triage.  Hx hidradenitis suppurativa.  Reports I&D on 4/26 w/o improvement.  Denies fever, bodyaches, chills.  Denies drainage

## 2022-09-28 NOTE — ED Provider Notes (Signed)
[place] EMERGENCY DEPARTMENT AT [facility] Provider Note   CSN: [id number] Arrival date & time: 09/28/22  1502     History  Chief Complaint  Patient presents with   Abscess    Haley [name] is a 37 y.o. female.  With a history of diabetes, hidradenitis suppurativa, anxiety who presents to the ED for evaluation of abscess.  She states she noticed an area of swelling to the left upper abdomen approximately 3 weeks ago.  She then presented to another emergency department approximately 1 week ago.  At that time she opted to have needle aspiration and incision and drainage.  She was started on clindamycin which she reports compliance with.  She states that the area began to swell again shortly after the drainage.  It occasionally feels warm to the touch.  She states it does not feel like hidradenitis suppurativa to her.  She denies fevers, chills, abdominal pain, nausea, vomiting, bowel or bladder changes.   Abscess      Home Medications Prior to Admission medications   Medication Sig Start Date End Date Taking? Authorizing Provider  doxycycline (VIBRAMYCIN) 100 MG capsule Take 1 capsule (100 mg total) by mouth 2 (two) times daily for 7 days. 09/28/22 10/05/22 Yes Dala Breault, Edsel Petrin, PA-C  Accu-Chek Softclix Lancets lancets 1 each by Other route QID. Use as instructed to check blood sugar up to 4 times a day. 09/11/21   Gweneth Dimitri, MD  blood glucose meter kit and supplies Dispense based on patient and insurance preference. Use up to four times daily as directed. (FOR ICD-10 E10.9, E11.9). 09/18/21   Gweneth Dimitri, MD  clindamycin (CLEOCIN) 150 MG capsule Take 3 capsules (450 mg total) by mouth 3 (three) times daily for 7 days. 09/21/22 09/28/22  Loleta Rose, MD  escitalopram (LEXAPRO) 10 MG tablet Take 1 tablet (10 mg total) by mouth daily. 09/12/21   Gweneth Dimitri, MD  glucose blood test strip Use to test blood sugar up to 4 times a day 09/11/21   Gweneth Dimitri, MD   HYDROcodone-acetaminophen (NORCO/VICODIN) 5-325 MG tablet Take 1 tablet by mouth every 4 (four) hours as needed for moderate pain. 11/05/21 11/05/22  Cuthriell, Delorise Royals, PA-C  Insulin Glargine-Lixisenatide (SOLIQUA) 100-33 UNT-MCG/ML SOPN Inject 30 Units into the skin daily. 09/12/21   Gweneth Dimitri, MD  metFORMIN (GLUCOPHAGE-XR) 500 MG 24 hr tablet Take 4 tablets (2,000 mg total) by mouth daily with breakfast. 09/11/21   Gweneth Dimitri, MD  naproxen sodium (ALEVE) 220 MG tablet Take 220 mg by mouth daily as needed (pain).    [provider]  Norgestimate-Ethinyl Estradiol Triphasic (TRI-ESTARYLLA) 0.18/0.215/0.25 MG-35 MCG tablet Take 1 tablet by mouth daily.    [provider]  sulfamethoxazole-trimethoprim (BACTRIM DS) 800-160 MG tablet Take 1 tablet by mouth 2 (two) times daily. 11/05/21   Cuthriell, Delorise Royals, PA-C  traZODone (DESYREL) 50 MG tablet Take 0.5-2 tablets (25-100 mg total) by mouth at bedtime as needed for sleep. 09/11/21   Gweneth Dimitri, MD  Vitamin D, Ergocalciferol, (DRISDOL) 1.25 MG (50000 UNIT) CAPS capsule Take 1 capsule (50,000 Units total) by mouth every 7 (seven) days. 09/12/21   Gweneth Dimitri, MD      Allergies    Patient has no known allergies.    Review of Systems   Review of Systems  Skin:  Positive for wound.  All other systems reviewed and are negative.   Physical Exam Updated Vital Signs BP (!) 143/80 (BP Location: Right Arm)  Pulse 96   Temp 98.3 F (36.8 C) (Oral)   Resp 18   LMP 08/19/2022 (Approximate)   SpO2 98%  Physical Exam Vitals and nursing note reviewed.  Constitutional:      General: She is not in acute distress.    Appearance: Normal appearance. She is normal weight. She is not ill-appearing.  HENT:     Head: Normocephalic and atraumatic.  Pulmonary:     Effort: Pulmonary effort is normal. No respiratory distress.  Abdominal:     General: Abdomen is flat.  Musculoskeletal:        General: Normal range of motion.      Cervical back: Neck supple.  Skin:    General: Skin is warm and dry.          Comments: Approximately 6 cm x 5 cm area of induration with central fluctuance and overlying erythema.  Minimal surrounding erythema.  Does not feel warm to the touch.  Neurological:     Mental Status: She is alert and oriented to person, place, and time.  Psychiatric:        Mood and Affect: Mood normal.        Behavior: Behavior normal.     ED Results / Procedures / Treatments   Labs (all labs ordered are listed, but only abnormal results are displayed) Labs Reviewed - No data to display  EKG None  Radiology No results found.  Procedures .Marland KitchenIncision and Drainage  Date/Time: 09/28/2022 4:54 PM  Performed by: Michelle Piper, PA-C Authorized by: Michelle Piper, PA-C   Consent:    Consent obtained:  Verbal   Consent given by:  Patient   Risks discussed:  Bleeding, pain and infection   Alternatives discussed:  No treatment Universal protocol:    Procedure explained and questions answered to patient or proxy's satisfaction: yes     Relevant documents present and verified: yes     Test results available : yes     Required blood products, implants, devices, and special equipment available: yes     Immediately prior to procedure, a time out was called: yes     Patient identity confirmed:  Verbally with patient and arm band Location:    Type:  Abscess   Location:  Trunk   Trunk location:  Abdomen Pre-procedure details:    Skin preparation:  Betadine and chlorhexidine with alcohol Sedation:    Sedation type:  None Anesthesia:    Anesthesia method:  Local infiltration   Local anesthetic:  Lidocaine 2% WITH epi Procedure type:    Complexity:  Complex Procedure details:    Ultrasound guidance: yes     Incision types:  Single straight   Incision depth:  Subcutaneous   Wound management:  Probed and deloculated, irrigated with saline and extensive cleaning   Drainage:  Purulent    Drainage amount:  Copious   Wound treatment:  Wound left open   Packing materials:  1/4 in gauze   Amount 1/4":  4 inches Post-procedure details:    Procedure completion:  Tolerated well, no immediate complications     Medications Ordered in ED Medications  lidocaine-EPINEPHrine (XYLOCAINE W/EPI) 2 %-1:200000 (PF) injection 20 mL (20 mLs Infiltration Given by Other 09/28/22 1622)  doxycycline (VIBRA-TABS) tablet 100 mg (100 mg Oral Given 09/28/22 1639)    ED Course/ Medical Decision Making/ A&P  Medical Decision Making Risk Prescription drug management.  This patient presents to the ED for concern of abscess, this involves an extensive number of treatment options, and is a complaint that carries with it a high risk of complications and morbidity.  The differential diagnosis includes abscess, cellulitis, lipoma  Co morbidities that complicate the patient evaluation   type 2 diabetes, hidradenitis suppurativa  My initial workup includes bedside ultrasound, incision and drainage  Additional history obtained from: Nursing notes from this visit. Previous records within EMR system ED visit on 09/21/2022 for same  Afebrile, hemodynamically stable.  37 year old female presenting to to the emergency department for evaluation of abscess to the left upper abdomen.  It has been present for approximately 3 weeks.  Presented approximately 1 week ago to another emergency department and had a needle aspiration and was started on clindamycin.  The abscess then returned.  She opted for incision and drainage this time.  I performed a bedside ultrasound which showed mixed echogenicity pocket of subcutaneous fluid.  Incision and drainage was performed as stated above.  Copious amount of purulent drainage was expressed.  Wound was then probed, deloculated, irrigated and packed with iodoform.  Her wound culture from the last visit grew moderate Staphylococcus Ludgunesis.  Given her  lack of improvement with clindamycin in her diabetic status, patient will be started on doxycycline.  She was strongly encouraged to follow-up with a primary care provider of her choice in 5 to 7 days for reevaluation of her symptoms. She was instructed to remove the packing in 2 to 3 days. She was given strict return precautions in the ED.  Stable at discharge.  At this time there does not appear to be any evidence of an acute emergency medical condition and the patient appears stable for discharge with appropriate outpatient follow up. Diagnosis was discussed with patient who verbalizes understanding of care plan and is agreeable to discharge. I have discussed return precautions with patient who verbalizes understanding. Patient encouraged to follow-up with their PCP within 5 to 7 days. All questions answered.  Note: Portions of this report may have been transcribed using voice recognition software. Every effort was made to ensure accuracy; however, inadvertent computerized transcription errors may still be present.        Final Clinical Impression(s) / ED Diagnoses Final diagnoses:  Abscess    Rx / DC Orders ED Discharge Orders          Ordered    doxycycline (VIBRAMYCIN) 100 MG capsule  2 times daily        09/28/22 1650              Michelle Piper, Cordelia Poche 09/28/22 1658    Pricilla Loveless, MD 09/28/22 2221

## 2022-10-01 ENCOUNTER — Telehealth: Payer: Self-pay

## 2022-10-01 NOTE — Transitions of Care (Post Inpatient/ED Visit) (Signed)
   10/01/2022  Name: Haley [name] MRN: [id number] DOB: 02/03/1986  Today's TOC FU Call Status: Today's TOC FU Call Status:: Unsuccessul Call (1st Attempt) Unsuccessful Call (1st Attempt) Date: 10/01/22  Was able to reach patient but she could not hear requested that we call back at another time.   Attempted to reach the patient regarding the most recent Inpatient/ED visit.  Follow Up Plan: Additional outreach attempts will be made to reach the patient to complete the Transitions of Care (Post Inpatient/ED visit) call.   Signature Donnamarie Poag, CMA

## 2022-10-02 NOTE — Transitions of Care (Post Inpatient/ED Visit) (Signed)
Unable to reach pt by phone and left v/m requesting pt to call [phone].     10/02/2022  Name: Haley [name] MRN: [id number] DOB: [birth year]  Today's TOC FU Call Status: Today's TOC FU Call Status:: Unsuccessful Call (2nd Attempt) Unsuccessful Call (1st Attempt) Date: 10/01/22 Unsuccessful Call (2nd Attempt) Date: 10/02/22  Attempted to reach the patient regarding the most recent Inpatient/ED visit.  Follow Up Plan: Additional outreach attempts will be made to reach the patient to complete the Transitions of Care (Post Inpatient/ED visit) call.   Signature Lewanda Rife, LPN

## 2022-10-03 NOTE — Transitions of Care (Post Inpatient/ED Visit) (Signed)
I spoke with pt and she said the abscess under her breast is better; not as swollen but does feel hard to touch. Pt is still taking abx. pt said she cannot schedule appt due to no insurance and no money to pay for FU visit. pt is going to finish abx and if still abscess pt will go back to ED. at this time pt does not want to schedule TOC. Sending note Dr Patsy Lager as lead physician.       10/03/2022  Name: Haley [name] MRN: [id number] DOB: [birth year]  Today's TOC FU Call Status: Today's TOC FU Call Status:: Successful TOC FU Call Competed Unsuccessful Call (1st Attempt) Date: 10/01/22 Unsuccessful Call (2nd Attempt) Date: 10/02/22 [facility] FU Call Complete Date: 10/03/22  Transition Care Management Follow-up Telephone Call Date of Discharge: 09/28/22 Discharge Facility: [facility]) Type of Discharge: Emergency Department Reason for ED Visit: Other: (abscess below lt breast) How have you been since you were released from the hospital?: Better (pt said not swollen where infected but now feels hard.) Any questions or concerns?: No  Items Reviewed: Did you receive and understand the discharge instructions provided?: Yes Medications obtained,verified, and reconciled?: Yes (Medications Reviewed) (doxycyclilne 100mg  pt had no questions about taking her meds.) Any new allergies since your discharge?: No Dietary orders reviewed?: NA Do you have support at home?: Yes People in Home: parent(s) Name of Support/Comfort Primary Source: Haley [name]  Medications Reviewed Today: Medications Reviewed Today     Reviewed by Erby Pian, CMA (Certified Medical Assistant) on 09/11/21 at 1610  Med List Status: <None>   Medication Order Taking? Sig Documenting Provider Last Dose Status Informant  Accu-Chek Softclix Lancets lancets [id number] Yes 1 each by Other route QID. Use as instructed to check blood sugar up to 4 times a day. Lynnda Child, MD Taking Active   blood glucose meter kit and supplies  [id number] Yes Dispense based on patient and insurance preference. Use up to four times daily as directed. (FOR ICD-10 E10.9, E11.9). Lynnda Child, MD Taking Active Self  glucose blood test strip [id number] Yes Use to test blood sugar up to 4 times a day Lynnda Child, MD Taking Active   Insulin Pen Needle 32G X 4 MM MISC [id number] Yes 24 Units by Does not apply route at bedtime. Rodolph Bong, MD Taking Active   metFORMIN (GLUCOPHAGE) 1000 MG tablet [id number] Yes Take 1 tablet (1,000 mg total) by mouth in the morning and at bedtime. Keep appt with PCP. Lynnda Child, MD Taking Active   naproxen sodium (ALEVE) 220 MG tablet [id number] Yes Take 220 mg by mouth daily as needed (pain). [provider] Taking Active Self  Norgestimate-Ethinyl Estradiol Triphasic (TRI-ESTARYLLA) 0.18/0.215/0.25 MG-35 MCG tablet [id number] Yes Take 1 tablet by mouth daily. [provider] Taking Active   OZEMPIC, 0.25 OR 0.5 MG/DOSE, 2 MG/1.5ML SOPN [id number] Yes Inject 0.25 mg into the skin once a week. [provider] Taking Active             Home Care and Equipment/Supplies: Were Home Health Services Ordered?: NA Any new equipment or medical supplies ordered?: NA  Functional Questionnaire: Do you need assistance with bathing/showering or dressing?: No Do you need assistance with meal preparation?: No Do you need assistance with eating?: No Do you have difficulty maintaining continence: No Do you need assistance with getting out of bed/getting out of a chair/moving?: No Do you have difficulty managing or taking your medications?: No  Follow  up appointments reviewed: PCP Follow-up appointment confirmed?: NA (pt said she cannot schedule appt due to no insurance and no money to pay for FU visit. pt is going to finish abx and if still abscess pt will go back to ED. at this time pt does not want to schedule [facility].) Specialist Hospital Follow-up appointment confirmed?: NA (pt said  she cannot schedule appt due to no insurance and no money to pay for FU visit. pt is going to finish abx and if still abscess pt will go back to ED. at this time pt does not want to schedule TOC.) Do you need transportation to your follow-up appointment?: No Do you understand care options if your condition(s) worsen?: Yes-patient verbalized understanding    SIGNATURE Lewanda Rife, LPN

## 2023-01-08 ENCOUNTER — Emergency Department
Admission: EM | Admit: 2023-01-08 | Discharge: 2023-01-08 | Disposition: A | Discharge status: Home / Self Care | Payer: Medicaid - Out of State | Attending: Emergency Medicine | Admitting: Emergency Medicine

## 2023-01-08 ENCOUNTER — Emergency Department: Payer: Medicaid - Out of State

## 2023-01-08 ENCOUNTER — Other Ambulatory Visit: Payer: Self-pay

## 2023-01-08 DIAGNOSIS — F17290 Nicotine dependence, other tobacco product, uncomplicated: Secondary | ICD-10-CM | POA: Diagnosis not present

## 2023-01-08 DIAGNOSIS — R051 Acute cough: Secondary | ICD-10-CM | POA: Diagnosis not present

## 2023-01-08 DIAGNOSIS — L02213 Cutaneous abscess of chest wall: Secondary | ICD-10-CM | POA: Insufficient documentation

## 2023-01-08 DIAGNOSIS — Z1152 Encounter for screening for COVID-19: Secondary | ICD-10-CM | POA: Diagnosis not present

## 2023-01-08 DIAGNOSIS — R059 Cough, unspecified: Secondary | ICD-10-CM | POA: Diagnosis present

## 2023-01-08 DIAGNOSIS — L0291 Cutaneous abscess, unspecified: Secondary | ICD-10-CM

## 2023-01-08 LAB — RESP PANEL BY RT-PCR (RSV, FLU A&B, COVID)  RVPGX2
Influenza A by PCR: NEGATIVE
Influenza B by PCR: NEGATIVE
Resp Syncytial Virus by PCR: NEGATIVE
SARS Coronavirus 2 by RT PCR: NEGATIVE

## 2023-01-08 LAB — POC URINE PREG, ED: Preg Test, Ur: NEGATIVE

## 2023-01-08 MED ORDER — PREDNISONE 50 MG PO TABS: 50.0000 mg | ORAL_TABLET | Freq: Every day | ORAL | 0 refills | Status: AC

## 2023-01-08 MED ORDER — IPRATROPIUM-ALBUTEROL 0.5-2.5 (3) MG/3ML IN SOLN
3.0000 mL | Freq: Once | RESPIRATORY_TRACT | Status: AC
  Administered 2023-01-08: 3 mL via RESPIRATORY_TRACT
  Filled 2023-01-08: qty 3

## 2023-01-08 MED ORDER — ALBUTEROL SULFATE HFA 108 (90 BASE) MCG/ACT IN AERS: 2.0000 | INHALATION_SPRAY | Freq: Four times a day (QID) | RESPIRATORY_TRACT | 2 refills | Status: AC | PRN

## 2023-01-08 MED ORDER — LIDOCAINE HCL (PF) 1 % IJ SOLN
5.0000 mL | Freq: Once | INTRAMUSCULAR | Status: AC
  Administered 2023-01-08: 5 mL
  Filled 2023-01-08: qty 5

## 2023-01-08 MED ORDER — PREDNISONE 20 MG PO TABS
50.0000 mg | ORAL_TABLET | Freq: Once | ORAL | Status: AC
  Administered 2023-01-08: 50 mg via ORAL
  Filled 2023-01-08: qty 1

## 2023-01-08 MED ORDER — METFORMIN HCL 500 MG PO TABS: 500.0000 mg | ORAL_TABLET | Freq: Two times a day (BID) | ORAL | 0 refills | Status: AC

## 2023-01-08 NOTE — ED Provider Notes (Signed)
[facility] Provider Note    Event Date/Time   First MD Initiated Contact with Patient 01/08/23 [phone]     (approximate)   History   Cough and Mass   HPI  Haley [name] is a 37 y.o. female who presents to the ED for evaluation of Cough and Mass   Patient presents for evaluation of multiple complaints.  Reports that she smokes about 5 black and mild cigars per day and has no inhalers at home.  Reports a nonproductive cough for the past few weeks.  Sometimes producing thin sputum.  She reports that she often will stick her finger down her own throat to make herself throw up when she is feeling her acid reflux symptoms.  She reports doing this in a typical fashion 2-3 nights ago and caused her to throw up multiple times.  She reports some of these latter episodes of emesis had blood streaking within them.  No hemoptysis.  No further emesis or hematemesis.  No abdominal pain throughout any of this.  No recurrence of emesis  Also reporting a possible recurrence of an abscess on her anterior chest wall of her right clavicle.  Reports that she had some old antibiotics at home and took Bactrim double strength twice daily for for 5 days earlier this week.    Physical Exam   Triage Vital Signs: ED Triage Vitals  Encounter Vitals Group     BP 01/08/23 0310 131/87     Systolic BP Percentile --      Diastolic BP Percentile --      Pulse Rate 01/08/23 0310 81     Resp 01/08/23 0310 18     Temp 01/08/23 0310 98.2 F (36.8 C)     Temp Source 01/08/23 0310 Oral     SpO2 01/08/23 0310 100 %     Weight 01/08/23 0302 220 lb (99.8 kg)     Height 01/08/23 0302 5\' 5"  (1.651 m)     Head Circumference --      Peak Flow --      Pain Score 01/08/23 0301 3     Pain Loc --      Pain Education --      Exclude from Growth Chart --     Most recent vital signs: Vitals:   01/08/23 0400 01/08/23 0430  BP: 125/61   Pulse:  83  Resp:    Temp:    SpO2:  99%     General: Awake, no distress.  CV:  Good peripheral perfusion.  Resp:  Normal effort.  Faint scattered expiratory wheezes Abd:  No distention.  MSK:  No deformity noted.  Neuro:  No focal deficits appreciated. Other:  Small area of fluctuance without overlying erythema or warmth over her right clavicle, 2 cm in diameter   ED Results / Procedures / Treatments   Labs (all labs ordered are listed, but only abnormal results are displayed) Labs Reviewed  RESP PANEL BY RT-PCR (RSV, FLU A&B, COVID)  [id number]  POC URINE PREG, ED    EKG   RADIOLOGY CXR interpreted by me without evidence of acute cardiopulmonary pathology.  Official radiology report(s): DG Chest 2 View  Result Date: 01/08/2023 CLINICAL DATA:  Cough and history of tobacco use EXAM: CHEST - 2 VIEW COMPARISON:  01/08/2022 FINDINGS: The heart size and mediastinal contours are within normal limits. Both lungs are clear. The visualized skeletal structures are unremarkable. IMPRESSION: No active cardiopulmonary disease. Electronically Signed   By: Loraine Leriche  Lukens M.D.   On: 01/08/2023 04:04    PROCEDURES and INTERVENTIONS:  .Marland KitchenIncision and Drainage  Date/Time: 01/08/2023 4:44 AM  Performed by: Delton Prairie, MD Authorized by: Delton Prairie, MD   Consent:    Consent obtained:  Verbal   Consent given by:  Patient   Risks, benefits, and alternatives were discussed: yes   Location:    Type:  Abscess   Size:  2cm   Location:  Trunk   Trunk location:  Chest Anesthesia:    Anesthesia method:  Local infiltration   Local anesthetic:  Lidocaine 1% w/o epi Procedure type:    Complexity:  Simple Procedure details:    Needle aspiration: yes     Needle size:  22 G   Drainage:  Purulent   Drainage amount:  Scant   Packing materials:  None Post-procedure details:    Procedure completion:  Tolerated well, no immediate complications   Medications  ipratropium-albuterol (DUONEB) 0.5-2.5 (3) MG/3ML nebulizer solution 3 mL (3 mLs  Nebulization Given 01/08/23 0400)  predniSONE (DELTASONE) tablet 50 mg (50 mg Oral Given 01/08/23 0349)  lidocaine (PF) (XYLOCAINE) 1 % injection 5 mL (5 mLs Infiltration Given 01/08/23 0410)     IMPRESSION / MDM / ASSESSMENT AND PLAN / ED COURSE  I reviewed the triage vital signs and the nursing notes.  Differential diagnosis includes, but is not limited to, COPD, PTX, abscess, cellulitis, sepsis  {Patient presents with symptoms of an acute illness or injury that is potentially life-threatening.  Patient presents with a likely COPD exacerbation.  Lifelong smoker with scattered expiratory wheezes and nonproductive cough.  Improving with breathing treatment and I started her on a burst of steroids.  She just finished a few days of off label antibiotics and has no signs of cellulitis or sepsis.  Small area of fluctuance over her area of concern and I expressed some purulence via needle aspiration.  Discharged with prednisone burst, prescription for albuterol and refill of her metformin.  Clinical Course as of 01/08/23 0445  Tue Jan 08, 2023  0424 I&D performed. 1 cc purulence [DS]    Clinical Course User Index [DS] Delton Prairie, MD     FINAL CLINICAL IMPRESSION(S) / ED DIAGNOSES   Final diagnoses:  Acute cough  Abscess  Cigar smoker     Rx / DC Orders   ED Discharge Orders          Ordered    metFORMIN (GLUCOPHAGE) 500 MG tablet  2 times daily with meals        01/08/23 0425    predniSONE (DELTASONE) 50 MG tablet  Daily        01/08/23 0425    albuterol (VENTOLIN HFA) 108 (90 Base) MCG/ACT inhaler  Every 6 hours PRN        01/08/23 0425             Note:  This document was prepared using Dragon voice recognition software and may include unintentional dictation errors.   Delton Prairie, MD 01/08/23 [phone]

## 2023-01-08 NOTE — ED Triage Notes (Signed)
Pt to ED via POV c/o cough for a couple of weeks. Pt reports sometimes having a forceful cough and seeing some blood in mucus she coughs up. Pt also discovered a lump on right clavicle. Hx of hydradenitis suportiva.

## 2023-01-13 ENCOUNTER — Other Ambulatory Visit: Payer: Self-pay

## 2023-01-13 ENCOUNTER — Emergency Department (HOSPITAL_BASED_OUTPATIENT_CLINIC_OR_DEPARTMENT_OTHER)
Admission: EM | Admit: 2023-01-13 | Discharge: 2023-01-13 | Disposition: A | Discharge status: Home / Self Care | Payer: Medicaid - Out of State | Attending: Emergency Medicine | Admitting: Emergency Medicine

## 2023-01-13 ENCOUNTER — Encounter (HOSPITAL_BASED_OUTPATIENT_CLINIC_OR_DEPARTMENT_OTHER): Payer: Self-pay

## 2023-01-13 DIAGNOSIS — E119 Type 2 diabetes mellitus without complications: Secondary | ICD-10-CM | POA: Insufficient documentation

## 2023-01-13 DIAGNOSIS — L02412 Cutaneous abscess of left axilla: Secondary | ICD-10-CM | POA: Insufficient documentation

## 2023-01-13 DIAGNOSIS — Z7984 Long term (current) use of oral hypoglycemic drugs: Secondary | ICD-10-CM | POA: Diagnosis not present

## 2023-01-13 DIAGNOSIS — L02211 Cutaneous abscess of abdominal wall: Secondary | ICD-10-CM | POA: Insufficient documentation

## 2023-01-13 DIAGNOSIS — L0291 Cutaneous abscess, unspecified: Secondary | ICD-10-CM

## 2023-01-13 DIAGNOSIS — Z794 Long term (current) use of insulin: Secondary | ICD-10-CM | POA: Diagnosis not present

## 2023-01-13 MED ORDER — LIDOCAINE-EPINEPHRINE-TETRACAINE (LET) TOPICAL GEL
3.0000 mL | Freq: Once | TOPICAL | Status: AC
  Administered 2023-01-13: 3 mL via TOPICAL
  Filled 2023-01-13: qty 3

## 2023-01-13 MED ORDER — LIDOCAINE-EPINEPHRINE (PF) 2 %-1:200000 IJ SOLN
20.0000 mL | Freq: Once | INTRAMUSCULAR | Status: AC
  Administered 2023-01-13: 20 mL
  Filled 2023-01-13: qty 20

## 2023-01-13 MED ORDER — DOXYCYCLINE HYCLATE 100 MG PO TABS
100.0000 mg | ORAL_TABLET | Freq: Once | ORAL | Status: AC
  Administered 2023-01-13: 100 mg via ORAL
  Filled 2023-01-13: qty 1

## 2023-01-13 MED ORDER — ACETAMINOPHEN 325 MG PO TABS
650.0000 mg | ORAL_TABLET | Freq: Once | ORAL | Status: AC
  Administered 2023-01-13: 650 mg via ORAL
  Filled 2023-01-13: qty 2

## 2023-01-13 MED ORDER — FLUCONAZOLE 150 MG PO TABS: 150.0000 mg | ORAL_TABLET | Freq: Every day | ORAL | 0 refills | Status: DC

## 2023-01-13 MED ORDER — DOXYCYCLINE HYCLATE 100 MG PO CAPS: 100.0000 mg | ORAL_CAPSULE | Freq: Two times a day (BID) | ORAL | 0 refills | Status: DC

## 2023-01-13 NOTE — ED Triage Notes (Signed)
Pt. States that she noticed abscess 2 weeks ago but pain increased 2 days ago. Its located below left breast area. Rates pain 8/10. It is tender to touch.

## 2023-01-13 NOTE — Discharge Instructions (Signed)
Take the antibiotics as prescribed.  Perform warm soaks twice daily.  Packing should be removed from your abdomen in 2 days if still present.  You were referred to the surgeon for further evaluation of your areas of hidradenitis.  Return to the ED with new or worsening symptoms.

## 2023-01-13 NOTE — ED Provider Notes (Signed)
[facility] Provider Note   CSN: [id number] Arrival date & time: 01/13/23  0146     History  Chief Complaint  Patient presents with   Abscess    Haley [name] is a 37 y.o. female.  Patient with hidradenitis suppurativa and diabetes presenting with abscess to left lateral chest/abdominal wall.  This has been progressing for the past 2 weeks with increased pain over the past 2 days.  No bleeding or drainage.  No fever, chills, nausea or vomiting.  No chest pain or shortness of breath.  Has had many abscesses drained in the past.   Also notes increasing swelling to her left axilla.  Had abscess drained to right clavicular area at [place] 4 days ago but she believes it was not successful  The history is provided by the patient.  Abscess Associated symptoms: no fever, no headaches, no nausea and no vomiting        Home Medications Prior to Admission medications   Medication Sig Start Date End Date Taking? Authorizing Provider  Accu-Chek Softclix Lancets lancets 1 each by Other route QID. Use as instructed to check blood sugar up to 4 times a day. 09/11/21   Gweneth Dimitri, MD  albuterol (VENTOLIN HFA) 108 (90 Base) MCG/ACT inhaler Inhale 2 puffs into the lungs every 6 (six) hours as needed for wheezing or shortness of breath. 01/08/23   Delton Prairie, MD  blood glucose meter kit and supplies Dispense based on patient and insurance preference. Use up to four times daily as directed. (FOR ICD-10 E10.9, E11.9). 09/18/21   Gweneth Dimitri, MD  escitalopram (LEXAPRO) 10 MG tablet Take 1 tablet (10 mg total) by mouth daily. 09/12/21   Gweneth Dimitri, MD  glucose blood test strip Use to test blood sugar up to 4 times a day 09/11/21   Gweneth Dimitri, MD  Insulin Glargine-Lixisenatide (SOLIQUA) 100-33 UNT-MCG/ML SOPN Inject 30 Units into the skin daily. 09/12/21   Gweneth Dimitri, MD  metFORMIN (GLUCOPHAGE) 500 MG tablet Take 1 tablet (500 mg total) by mouth 2  (two) times daily with a meal. 01/08/23   Delton Prairie, MD  metFORMIN (GLUCOPHAGE-XR) 500 MG 24 hr tablet Take 4 tablets (2,000 mg total) by mouth daily with breakfast. 09/11/21   Gweneth Dimitri, MD  naproxen sodium (ALEVE) 220 MG tablet Take 220 mg by mouth daily as needed (pain).    [provider]  Norgestimate-Ethinyl Estradiol Triphasic (TRI-ESTARYLLA) 0.18/0.215/0.25 MG-35 MCG tablet Take 1 tablet by mouth daily.    [provider]  sulfamethoxazole-trimethoprim (BACTRIM DS) 800-160 MG tablet Take 1 tablet by mouth 2 (two) times daily. 11/05/21   Cuthriell, Delorise Royals, PA-C  traZODone (DESYREL) 50 MG tablet Take 0.5-2 tablets (25-100 mg total) by mouth at bedtime as needed for sleep. 09/11/21   Gweneth Dimitri, MD  Vitamin D, Ergocalciferol, (DRISDOL) 1.25 MG (50000 UNIT) CAPS capsule Take 1 capsule (50,000 Units total) by mouth every 7 (seven) days. 09/12/21   Gweneth Dimitri, MD      Allergies    Patient has no known allergies.    Review of Systems   Review of Systems  Constitutional:  Negative for activity change, appetite change and fever.  HENT:  Negative for congestion.   Respiratory:  Negative for cough and shortness of breath.   Cardiovascular:  Negative for chest pain.  Gastrointestinal:  Negative for abdominal pain, nausea and vomiting.  Genitourinary:  Negative for dysuria and hematuria.  Musculoskeletal:  Negative for arthralgias  and myalgias.  Skin:  Positive for wound.  Neurological:  Negative for dizziness, weakness and headaches.   all other systems are negative except as noted in the HPI and PMH.    Physical Exam Updated Vital Signs BP 138/82   Pulse 83   Temp 98.4 F (36.9 C) (Oral)   Resp 17   Ht 5\' 5"  (1.651 m)   Wt 99.8 kg   LMP 12/04/2022 (Approximate)   SpO2 100%   BMI 36.61 kg/m  Physical Exam Vitals and nursing note reviewed.  Constitutional:      General: She is not in acute distress.    Appearance: She is well-developed.  HENT:      Head: Normocephalic and atraumatic.     Mouth/Throat:     Pharynx: No oropharyngeal exudate.  Eyes:     Conjunctiva/sclera: Conjunctivae normal.     Pupils: Pupils are equal, round, and reactive to light.  Neck:     Comments: No meningismus. Cardiovascular:     Rate and Rhythm: Normal rate and regular rhythm.     Heart sounds: Normal heart sounds. No murmur heard. Pulmonary:     Effort: Pulmonary effort is normal. No respiratory distress.     Breath sounds: Normal breath sounds.     Comments: Right supraclavicular area of pain and swelling and induration 1 cm.  Abdominal:     Palpations: Abdomen is soft.     Tenderness: There is no abdominal tenderness. There is no guarding or rebound.     Comments: 2 cm area of induration and fluctuance to left chest/abdominal wall. Meryle Ready RN present for entirety of physical exam and history taking as well as procedure.  Musculoskeletal:        General: No tenderness. Normal range of motion.     Cervical back: Normal range of motion and neck supple.     Comments: Extensive scar tissue to left axilla.  Patient was concerned about an 0.5 cm area of fluctuance induration to medial axilla.  There are some tunneling inferiorly with drainage.  Skin:    General: Skin is warm.  Neurological:     Mental Status: She is alert and oriented to person, place, and time.     Cranial Nerves: No cranial nerve deficit.     Motor: No abnormal muscle tone.     Coordination: Coordination normal.     Comments:  5/5 strength throughout. CN 2-12 intact.Equal grip strength.   Psychiatric:        Behavior: Behavior normal.     ED Results / Procedures / Treatments   Labs (all labs ordered are listed, but only abnormal results are displayed) Labs Reviewed - No data to display  EKG None  Radiology No results found.  Procedures .Marland KitchenIncision and Drainage  Date/Time: 01/13/2023 3:06 AM  Performed by: Glynn Octave, MD Authorized by: Glynn Octave, MD    Consent:    Consent obtained:  Verbal   Consent given by:  Patient   Risks, benefits, and alternatives were discussed: yes     Risks discussed:  Infection, incomplete drainage, pain, bleeding and damage to other organs   Alternatives discussed:  No treatment Universal protocol:    Procedure explained and questions answered to patient or proxy's satisfaction: yes     Relevant documents present and verified: yes     Immediately prior to procedure, a time out was called: yes     Patient identity confirmed:  Verbally with patient Location:    Type:  Abscess  Size:  3   Location:  Trunk   Trunk location:  Abdomen Pre-procedure details:    Skin preparation:  Chlorhexidine Anesthesia:    Anesthesia method:  Local infiltration and topical application   Local anesthetic:  Lidocaine 2% WITH epi Procedure type:    Complexity:  Simple Procedure details:    Ultrasound guidance: no     Incision types:  Single straight   Incision depth:  Subcutaneous   Wound management:  Probed and deloculated, irrigated with saline and extensive cleaning   Drainage:  Purulent   Drainage amount:  Copious   Wound treatment:  Wound left open   Packing materials:  1/4 in iodoform gauze Post-procedure details:    Procedure completion:  Tolerated well, no immediate complications .Marland KitchenIncision and Drainage  Date/Time: 01/13/2023 4:10 AM  Performed by: Glynn Octave, MD Authorized by: Glynn Octave, MD   Consent:    Consent obtained:  Verbal   Consent given by:  Patient   Risks, benefits, and alternatives were discussed: yes     Risks discussed:  Bleeding, damage to other organs, infection, pain and incomplete drainage Universal protocol:    Procedure explained and questions answered to patient or proxy's satisfaction: yes     Patient identity confirmed:  Verbally with patient Location:    Type:  Abscess   Size:  0.5   Location:  Upper extremity   Upper extremity location: L axilla. Anesthesia:     Anesthesia method:  Local infiltration   Local anesthetic:  Lidocaine 2% WITH epi Procedure type:    Complexity:  Simple Procedure details:    Needle aspiration: no     Incision types:  Single straight   Incision depth:  Subcutaneous   Wound management:  Probed and deloculated, irrigated with saline and extensive cleaning   Drainage:  Purulent   Drainage amount:  Moderate   Wound treatment:  Wound left open Post-procedure details:    Procedure completion:  Tolerated     Medications Ordered in ED Medications  lidocaine-EPINEPHrine (XYLOCAINE W/EPI) 2 %-1:200000 (PF) injection 20 mL (has no administration in time range)  lidocaine-EPINEPHrine-tetracaine (LET) topical gel (3 mLs Topical Given 01/13/23 0237)    ED Course/ Medical Decision Making/ A&P                                 Medical Decision Making Amount and/or Complexity of Data Reviewed Labs: ordered. Decision-making details documented in ED Course. Radiology: ordered and independent interpretation performed. Decision-making details documented in ED Course. ECG/medicine tests: ordered and independent interpretation performed. Decision-making details documented in ED Course.  Risk OTC drugs. Prescription drug management.   Abscess to L chest wall. Vitals stable. No distress.   Incision and drainage performed as above after discussion of risk and benefits. Meryle Ready RN present as Biomedical engineer.   Large amount of purulence expressed from abdominal abscess which was packed.  Small amount of purulence obtained from left axillary abscess.  Patient declines incision of her supraclavicular abscess.  Will antibiotics, warm compresses, referral to general surgery given hidradenitis in multiple areas. Follow-up with PCP for wound check in 2 days.  Warm soaks, remove packing in 2 days if still present.  no evidence of sepsis Return precautions discussed       Final Clinical Impression(s) / ED Diagnoses Final diagnoses:   Abscess    Rx / DC Orders ED Discharge Orders     None  Glynn Octave, MD 01/13/23 [phone]

## 2023-05-01 ENCOUNTER — Encounter (HOSPITAL_COMMUNITY): Payer: Self-pay

## 2023-05-01 ENCOUNTER — Other Ambulatory Visit: Payer: Self-pay

## 2023-05-01 ENCOUNTER — Emergency Department (HOSPITAL_COMMUNITY)
Admission: EM | Admit: 2023-05-01 | Discharge: 2023-05-02 | Disposition: A | Discharge status: Home / Self Care | Payer: Medicaid - Out of State | Attending: Emergency Medicine | Admitting: Emergency Medicine

## 2023-05-01 ENCOUNTER — Emergency Department (HOSPITAL_COMMUNITY): Payer: No Typology Code available for payment source

## 2023-05-01 DIAGNOSIS — L732 Hidradenitis suppurativa: Secondary | ICD-10-CM | POA: Diagnosis not present

## 2023-05-01 DIAGNOSIS — R000 Tachycardia, unspecified: Secondary | ICD-10-CM | POA: Insufficient documentation

## 2023-05-01 DIAGNOSIS — R109 Unspecified abdominal pain: Secondary | ICD-10-CM | POA: Diagnosis not present

## 2023-05-01 DIAGNOSIS — Z7984 Long term (current) use of oral hypoglycemic drugs: Secondary | ICD-10-CM | POA: Insufficient documentation

## 2023-05-01 DIAGNOSIS — R531 Weakness: Secondary | ICD-10-CM | POA: Diagnosis not present

## 2023-05-01 DIAGNOSIS — E119 Type 2 diabetes mellitus without complications: Secondary | ICD-10-CM | POA: Diagnosis not present

## 2023-05-01 DIAGNOSIS — R0789 Other chest pain: Secondary | ICD-10-CM | POA: Diagnosis not present

## 2023-05-01 DIAGNOSIS — R0602 Shortness of breath: Secondary | ICD-10-CM | POA: Insufficient documentation

## 2023-05-01 DIAGNOSIS — Z794 Long term (current) use of insulin: Secondary | ICD-10-CM | POA: Diagnosis not present

## 2023-05-01 DIAGNOSIS — R079 Chest pain, unspecified: Secondary | ICD-10-CM | POA: Diagnosis present

## 2023-05-01 LAB — CBC WITH DIFFERENTIAL/PLATELET
Abs Immature Granulocytes: 0.02 10*3/uL (ref 0.00–0.07)
Basophils Absolute: 0.1 10*3/uL (ref 0.0–0.1)
Basophils Relative: 1 %
Eosinophils Absolute: 0.4 10*3/uL (ref 0.0–0.5)
Eosinophils Relative: 4 %
HCT: 33.3 % — ABNORMAL LOW (ref 36.0–46.0)
Hemoglobin: 11 g/dL — ABNORMAL LOW (ref 12.0–15.0)
Immature Granulocytes: 0 %
Lymphocytes Relative: 28 %
Lymphs Abs: 2.7 10*3/uL (ref 0.7–4.0)
MCH: 28.1 pg (ref 26.0–34.0)
MCHC: 33 g/dL (ref 30.0–36.0)
MCV: 84.9 fL (ref 80.0–100.0)
Monocytes Absolute: 0.5 10*3/uL (ref 0.1–1.0)
Monocytes Relative: 5 %
Neutro Abs: 6.1 10*3/uL (ref 1.7–7.7)
Neutrophils Relative %: 62 %
Platelets: 233 10*3/uL (ref 150–400)
RBC: 3.92 MIL/uL (ref 3.87–5.11)
RDW: 12.8 % (ref 11.5–15.5)
WBC: 9.8 10*3/uL (ref 4.0–10.5)
nRBC: 0 % (ref 0.0–0.2)

## 2023-05-01 LAB — COMPREHENSIVE METABOLIC PANEL
ALT: 15 U/L (ref 0–44)
AST: 17 U/L (ref 15–41)
Albumin: 3.2 g/dL — ABNORMAL LOW (ref 3.5–5.0)
Alkaline Phosphatase: 73 U/L (ref 38–126)
Anion gap: 8 (ref 5–15)
BUN: 10 mg/dL (ref 6–20)
CO2: 24 mmol/L (ref 22–32)
Calcium: 8.7 mg/dL — ABNORMAL LOW (ref 8.9–10.3)
Chloride: 103 mmol/L (ref 98–111)
Creatinine, Ser: 0.6 mg/dL (ref 0.44–1.00)
GFR, Estimated: >60 mL/min (ref >60)
Glucose, Bld: 355 mg/dL — ABNORMAL HIGH (ref 70–99)
Potassium: 3.5 mmol/L (ref 3.5–5.1)
Sodium: 135 mmol/L (ref 135–145)
Total Bilirubin: 0.4 mg/dL (ref <1.2)
Total Protein: 7.4 g/dL (ref 6.5–8.1)

## 2023-05-01 LAB — TROPONIN I (HIGH SENSITIVITY): Troponin I (High Sensitivity): 2 ng/L (ref <18)

## 2023-05-01 NOTE — ED Triage Notes (Signed)
Pt reports with SHOB, chest pain when she breathes, and right flank pain x 3 days.

## 2023-05-02 ENCOUNTER — Encounter (HOSPITAL_COMMUNITY): Payer: Self-pay

## 2023-05-02 ENCOUNTER — Emergency Department (HOSPITAL_COMMUNITY): Payer: Medicaid - Out of State

## 2023-05-02 LAB — URINALYSIS, ROUTINE W REFLEX MICROSCOPIC
Bacteria, UA: NONE SEEN
Bilirubin Urine: NEGATIVE
Glucose, UA: >=500 mg/dL — AB
Ketones, ur: NEGATIVE mg/dL
Leukocytes,Ua: NEGATIVE
Nitrite: NEGATIVE
Protein, ur: 100 mg/dL — AB
RBC / HPF: >50 RBC/hpf (ref 0–5)
Specific Gravity, Urine: 1.02 (ref 1.005–1.030)
pH: 5 (ref 5.0–8.0)

## 2023-05-02 LAB — TROPONIN I (HIGH SENSITIVITY): Troponin I (High Sensitivity): 2 ng/L (ref <18)

## 2023-05-02 LAB — PREGNANCY, URINE: Preg Test, Ur: NEGATIVE

## 2023-05-02 LAB — D-DIMER, QUANTITATIVE: D-Dimer, Quant: 1.07 ug{FEU}/mL — ABNORMAL HIGH (ref 0.00–0.50)

## 2023-05-02 MED ORDER — SODIUM CHLORIDE (PF) 0.9 % IJ SOLN
INTRAMUSCULAR | Status: AC
Start: 2023-05-02 — End: ?
  Filled 2023-05-02: qty 50

## 2023-05-02 MED ORDER — CLINDAMYCIN PHOSPHATE 1 % EX GEL: Freq: Two times a day (BID) | CUTANEOUS | 0 refills | Status: AC

## 2023-05-02 MED ORDER — KETOROLAC TROMETHAMINE 15 MG/ML IJ SOLN
15.0000 mg | Freq: Once | INTRAMUSCULAR | Status: AC
Start: 2023-05-02 — End: 2023-05-02
  Administered 2023-05-02: 15 mg via INTRAVENOUS
  Filled 2023-05-02: qty 1

## 2023-05-02 MED ORDER — DOXYCYCLINE HYCLATE 100 MG PO CAPS: 100.0000 mg | ORAL_CAPSULE | Freq: Two times a day (BID) | ORAL | 0 refills | Status: AC

## 2023-05-02 MED ORDER — BENZONATATE 100 MG PO CAPS: 100.0000 mg | ORAL_CAPSULE | Freq: Three times a day (TID) | ORAL | 0 refills | Status: AC

## 2023-05-02 MED ORDER — KETOROLAC TROMETHAMINE 15 MG/ML IJ SOLN: 15.0000 mg | Freq: Once | INTRAMUSCULAR | Status: DC

## 2023-05-02 MED ORDER — IOHEXOL 350 MG/ML SOLN
100.0000 mL | Freq: Once | INTRAVENOUS | Status: AC | PRN
  Administered 2023-05-02: 100 mL via INTRAVENOUS

## 2023-05-02 NOTE — Discharge Instructions (Addendum)
Your workup tonight was reassuring.  I have prescribed Tessalon which is a cough medication.  I also prescribed doxycycline for your hidradenitis suppurativa.  I provided a phone number for the hidradenitis clinic in [facility].  I recommend calling them for follow-up for hidradenitis.  If you develop life-threatening symptoms return to the emergency department.

## 2023-05-02 NOTE — ED Provider Notes (Signed)
[place] EMERGENCY DEPARTMENT AT [facility] Provider Note   CSN: [id number] Arrival date & time: 05/01/23  2223     History  Chief Complaint  Patient presents with   Shortness of Breath   Chest Pain    Haley [name] is a 37 y.o. female.  Patient presents to the emergency room complaining of right-sided flank pain, shortness of breath, right-sided chest pain which been ongoing for 3 days.  Pain is worse with breathing.  She denies nausea, vomiting.  Pain does seem to be worse with movement of her right arm and with palpation of the flank.  Patient also endorses some generalized weakness.  Past medical history of significant type II DM, hidradenitis suppurativa, iron deficiency anemia, generalized anxiety disorder   Shortness of Breath Associated symptoms: chest pain   Chest Pain Associated symptoms: shortness of breath        Home Medications Prior to Admission medications   Medication Sig Start Date End Date Taking? Authorizing Provider  benzonatate (TESSALON) 100 MG capsule Take 1 capsule (100 mg total) by mouth every 8 (eight) hours. 05/02/23  Yes Darrick Grinder, PA-C  doxycycline (VIBRAMYCIN) 100 MG capsule Take 1 capsule (100 mg total) by mouth 2 (two) times daily. 05/02/23  Yes Barrie Dunker B, PA-C  Accu-Chek Softclix Lancets lancets 1 each by Other route QID. Use as instructed to check blood sugar up to 4 times a day. 09/11/21   Gweneth Dimitri, MD  albuterol (VENTOLIN HFA) 108 (90 Base) MCG/ACT inhaler Inhale 2 puffs into the lungs every 6 (six) hours as needed for wheezing or shortness of breath. 01/08/23   Delton Prairie, MD  blood glucose meter kit and supplies Dispense based on patient and insurance preference. Use up to four times daily as directed. (FOR ICD-10 E10.9, E11.9). 09/18/21   Gweneth Dimitri, MD  escitalopram (LEXAPRO) 10 MG tablet Take 1 tablet (10 mg total) by mouth daily. 09/12/21   Gweneth Dimitri, MD  fluconazole (DIFLUCAN) 150 MG tablet Take 1  tablet (150 mg total) by mouth daily. 01/13/23   Rancour, Jeannett Senior, MD  glucose blood test strip Use to test blood sugar up to 4 times a day 09/11/21   Gweneth Dimitri, MD  Insulin Glargine-Lixisenatide (SOLIQUA) 100-33 UNT-MCG/ML SOPN Inject 30 Units into the skin daily. 09/12/21   Gweneth Dimitri, MD  metFORMIN (GLUCOPHAGE) 500 MG tablet Take 1 tablet (500 mg total) by mouth 2 (two) times daily with a meal. 01/08/23   Delton Prairie, MD  metFORMIN (GLUCOPHAGE-XR) 500 MG 24 hr tablet Take 4 tablets (2,000 mg total) by mouth daily with breakfast. 09/11/21   Gweneth Dimitri, MD  naproxen sodium (ALEVE) 220 MG tablet Take 220 mg by mouth daily as needed (pain).    [provider]  Norgestimate-Ethinyl Estradiol Triphasic (TRI-ESTARYLLA) 0.18/0.215/0.25 MG-35 MCG tablet Take 1 tablet by mouth daily.    [provider]  sulfamethoxazole-trimethoprim (BACTRIM DS) 800-160 MG tablet Take 1 tablet by mouth 2 (two) times daily. 11/05/21   Cuthriell, Delorise Royals, PA-C  traZODone (DESYREL) 50 MG tablet Take 0.5-2 tablets (25-100 mg total) by mouth at bedtime as needed for sleep. 09/11/21   Gweneth Dimitri, MD  Vitamin D, Ergocalciferol, (DRISDOL) 1.25 MG (50000 UNIT) CAPS capsule Take 1 capsule (50,000 Units total) by mouth every 7 (seven) days. 09/12/21   Gweneth Dimitri, MD      Allergies    Patient has no known allergies.    Review of Systems   Review of Systems  Respiratory:  Positive for shortness of breath.   Cardiovascular:  Positive for chest pain.    Physical Exam Updated Vital Signs BP 139/68 (BP Location: Left Arm)   Pulse 99   Temp 98.3 F (36.8 C) (Oral)   Resp 12   Ht 5\' 5"  (1.651 m)   Wt 101.2 kg   LMP 05/01/2023 Comment: negative urine pregnancy test 05/02/23  SpO2 98%   BMI 37.11 kg/m  Physical Exam Vitals and nursing note reviewed.  Constitutional:      General: She is not in acute distress.    Appearance: She is well-developed.  HENT:     Head: Normocephalic and atraumatic.   Eyes:     Conjunctiva/sclera: Conjunctivae normal.  Cardiovascular:     Rate and Rhythm: Regular rhythm. Tachycardia present.  Pulmonary:     Effort: Pulmonary effort is normal. No respiratory distress.     Breath sounds: Normal breath sounds.  Chest:     Chest wall: No tenderness.  Abdominal:     Palpations: Abdomen is soft.     Tenderness: There is no abdominal tenderness.     Comments: Tenderness to palpation of the right flank  Musculoskeletal:        General: No swelling.     Cervical back: Neck supple.     Right lower leg: No edema.     Left lower leg: No edema.  Skin:    General: Skin is warm and dry.     Capillary Refill: Capillary refill takes less than 2 seconds.  Neurological:     Mental Status: She is alert.  Psychiatric:        Mood and Affect: Mood is anxious.     ED Results / Procedures / Treatments   Labs (all labs ordered are listed, but only abnormal results are displayed) Labs Reviewed  CBC WITH DIFFERENTIAL/PLATELET - Abnormal; Notable for the following components:      Result Value   Hemoglobin 11.0 (*)    HCT 33.3 (*)    All other components within normal limits  COMPREHENSIVE METABOLIC PANEL - Abnormal; Notable for the following components:   Glucose, Bld 355 (*)    Calcium 8.7 (*)    Albumin 3.2 (*)    All other components within normal limits  URINALYSIS, ROUTINE W REFLEX MICROSCOPIC - Abnormal; Notable for the following components:   APPearance HAZY (*)    Glucose, UA >=500 (*)    Hgb urine dipstick LARGE (*)    Protein, ur 100 (*)    All other components within normal limits  D-DIMER, QUANTITATIVE - Abnormal; Notable for the following components:   D-Dimer, Quant 1.07 (*)    All other components within normal limits  PREGNANCY, URINE  TROPONIN I (HIGH SENSITIVITY)  TROPONIN I (HIGH SENSITIVITY)    EKG None  Radiology CT Angio Chest PE W and/or Wo Contrast  Result Date: 05/02/2023 CLINICAL DATA:  Pulmonary embolus suspected  with low to intermediate probability. Positive D-dimer. Shortness of breath, chest pain, and right flank pain for 3 days. Microhematuria. Abdominal and flank pain with stone suspected. EXAM: CT ANGIOGRAPHY CHEST CT ABDOMEN AND PELVIS WITH CONTRAST TECHNIQUE: Multidetector CT imaging of the chest was performed using the standard protocol during bolus administration of intravenous contrast. Multiplanar CT image reconstructions and MIPs were obtained to evaluate the vascular anatomy. Multidetector CT imaging of the abdomen and pelvis was performed using the standard protocol during bolus administration of intravenous contrast. RADIATION DOSE REDUCTION: This exam was  performed according to the departmental dose-optimization program which includes automated exposure control, adjustment of the mA and/or kV according to patient size and/or use of iterative reconstruction technique. CONTRAST:  OMNIPAQUE IOHEXOL 350 MG/ML SOLN COMPARISON:  Chest radiograph 05/01/2023. CT abdomen and pelvis 08/23/2020 FINDINGS: CTA CHEST FINDINGS Cardiovascular: Technically adequate study with good opacification of the central and segmental pulmonary arteries. Mild motion artifact. No focal filling defects. No evidence of significant pulmonary embolus. Normal heart size. Normal caliber thoracic aorta. No aortic dissection. Great vessel origins are patent. Mediastinum/Nodes: No enlarged mediastinal, hilar, or axillary lymph nodes. Thyroid gland, trachea, and esophagus demonstrate no significant findings. Lungs/Pleura: Motion artifact limits examination. There appears to be mild mosaic attenuation pattern particularly in the lower lungs. This may be due to edema or air trapping. Multifocal pneumonia is less likely. No pleural effusions. No pneumothorax. Musculoskeletal: No chest wall abnormality. No acute or significant osseous findings. Review of the MIP images confirms the above findings. CT ABDOMEN and PELVIS FINDINGS Hepatobiliary:  Mild diffuse fatty infiltration of the liver. No focal lesions. Gallbladder and bile ducts are normal. Pancreas: Unremarkable. No pancreatic ductal dilatation or surrounding inflammatory changes. Spleen: Normal in size without focal abnormality. Adrenals/Urinary Tract: Adrenal glands are unremarkable. Kidneys are normal, without renal calculi, focal lesion, or hydronephrosis. Bladder is unremarkable. Stomach/Bowel: Stomach, small bowel, and colon are not abnormally distended. No wall thickening or inflammatory changes. Stool throughout the colon. Appendix is not identified. Vascular/Lymphatic: No significant vascular findings are present. No enlarged abdominal or pelvic lymph nodes. Reproductive: Uterus and bilateral adnexa are unremarkable. Other: No abdominal wall hernia or abnormality. No abdominopelvic ascites. Musculoskeletal: No acute or significant osseous findings. Review of the MIP images confirms the above findings. IMPRESSION: 1. No evidence of significant pulmonary embolus. 2. Patchy mosaic attenuation pattern to the lower lungs may represent edema, air trapping, or less likely pneumonia. 3. Mild fatty infiltration of the liver. 4. No acute process demonstrated in the abdomen or pelvis. No evidence of bowel obstruction or inflammation. No renal or ureteral stone or obstruction. Electronically Signed   By: Burman Nieves M.D.   On: 05/02/2023 03:48   CT ABDOMEN PELVIS W CONTRAST  Result Date: 05/02/2023 CLINICAL DATA:  Pulmonary embolus suspected with low to intermediate probability. Positive D-dimer. Shortness of breath, chest pain, and right flank pain for 3 days. Microhematuria. Abdominal and flank pain with stone suspected. EXAM: CT ANGIOGRAPHY CHEST CT ABDOMEN AND PELVIS WITH CONTRAST TECHNIQUE: Multidetector CT imaging of the chest was performed using the standard protocol during bolus administration of intravenous contrast. Multiplanar CT image reconstructions and MIPs were obtained to  evaluate the vascular anatomy. Multidetector CT imaging of the abdomen and pelvis was performed using the standard protocol during bolus administration of intravenous contrast. RADIATION DOSE REDUCTION: This exam was performed according to the departmental dose-optimization program which includes automated exposure control, adjustment of the mA and/or kV according to patient size and/or use of iterative reconstruction technique. CONTRAST:  OMNIPAQUE IOHEXOL 350 MG/ML SOLN COMPARISON:  Chest radiograph 05/01/2023. CT abdomen and pelvis 08/23/2020 FINDINGS: CTA CHEST FINDINGS Cardiovascular: Technically adequate study with good opacification of the central and segmental pulmonary arteries. Mild motion artifact. No focal filling defects. No evidence of significant pulmonary embolus. Normal heart size. Normal caliber thoracic aorta. No aortic dissection. Great vessel origins are patent. Mediastinum/Nodes: No enlarged mediastinal, hilar, or axillary lymph nodes. Thyroid gland, trachea, and esophagus demonstrate no significant findings. Lungs/Pleura: Motion artifact limits examination. There appears to be  mild mosaic attenuation pattern particularly in the lower lungs. This may be due to edema or air trapping. Multifocal pneumonia is less likely. No pleural effusions. No pneumothorax. Musculoskeletal: No chest wall abnormality. No acute or significant osseous findings. Review of the MIP images confirms the above findings. CT ABDOMEN and PELVIS FINDINGS Hepatobiliary: Mild diffuse fatty infiltration of the liver. No focal lesions. Gallbladder and bile ducts are normal. Pancreas: Unremarkable. No pancreatic ductal dilatation or surrounding inflammatory changes. Spleen: Normal in size without focal abnormality. Adrenals/Urinary Tract: Adrenal glands are unremarkable. Kidneys are normal, without renal calculi, focal lesion, or hydronephrosis. Bladder is unremarkable. Stomach/Bowel: Stomach, small bowel, and colon are  not abnormally distended. No wall thickening or inflammatory changes. Stool throughout the colon. Appendix is not identified. Vascular/Lymphatic: No significant vascular findings are present. No enlarged abdominal or pelvic lymph nodes. Reproductive: Uterus and bilateral adnexa are unremarkable. Other: No abdominal wall hernia or abnormality. No abdominopelvic ascites. Musculoskeletal: No acute or significant osseous findings. Review of the MIP images confirms the above findings. IMPRESSION: 1. No evidence of significant pulmonary embolus. 2. Patchy mosaic attenuation pattern to the lower lungs may represent edema, air trapping, or less likely pneumonia. 3. Mild fatty infiltration of the liver. 4. No acute process demonstrated in the abdomen or pelvis. No evidence of bowel obstruction or inflammation. No renal or ureteral stone or obstruction. Electronically Signed   By: Burman Nieves M.D.   On: 05/02/2023 03:48   DG Chest 2 View  Result Date: 05/01/2023 CLINICAL DATA:  Shortness of breath. Chest pain while breathing. Right flank pain for 3 days. EXAM: CHEST - 2 VIEW COMPARISON:  01/08/2023 FINDINGS: Shallow inspiration. Normal heart size and pulmonary vascularity. No focal airspace disease or consolidation in the lungs. No blunting of costophrenic angles. No pneumothorax. Mediastinal contours appear intact. IMPRESSION: No active cardiopulmonary disease. Electronically Signed   By: Burman Nieves M.D.   On: 05/01/2023 23:42    Procedures Procedures    Medications Ordered in ED Medications  iohexol (OMNIPAQUE) 350 MG/ML injection 100 mL (100 mLs Intravenous Contrast Given 05/02/23 0147)  ketorolac (TORADOL) 15 MG/ML injection 15 mg (15 mg Intravenous Given 05/02/23 0426)    ED Course/ Medical Decision Making/ A&P                                 Medical Decision Making Amount and/or Complexity of Data Reviewed Labs: ordered. Radiology: ordered.  Risk Prescription drug management.   This  patient presents to the ED for concern of chest pain and flank pain, this involves an extensive number of treatment options, and is a complaint that carries with it a high risk of complications and morbidity.  The differential diagnosis includes musculoskeletal pain, PE, pneumonia, ACS, others.  Differential for the flank pain includes musculoskeletal pain, nephrolithiasis, hydronephrosis, others   Co morbidities that complicate the patient evaluation  Anxiety, type II DM   Lab Tests:  I Ordered, and personally interpreted labs.  The pertinent results include: D-dimer elevated at 1.07, initial and repeat troponin 2, unremarkable UA, glucose 355, no leukocytosis   Imaging Studies ordered:  I ordered imaging studies including chest x-ray, CT angio chest PE study, CT abdomen pelvis with contrast I independently visualized and interpreted imaging which showed  1. No evidence of significant pulmonary embolus.  2. Patchy mosaic attenuation pattern to the lower lungs may  represent edema, air trapping, or less likely pneumonia.  3. Mild fatty infiltration of the liver.  4. No acute process demonstrated in the abdomen or pelvis. No  evidence of bowel obstruction or inflammation. No renal or ureteral  stone or obstruction.   I agree with the radiologist interpretation   Cardiac Monitoring: / EKG:  The patient was maintained on a cardiac monitor.  I personally viewed and interpreted the cardiac monitored which showed an underlying rhythm of: Sinus rhythm   Problem List / ED Course / Critical interventions / Medication management   I ordered medication including Toradol for inflammation Reevaluation of the patient after these medicines showed that the patient improved I have reviewed the patients home medicines and have made adjustments as needed   Social Determinants of Health:  Patient is currently uninsured, smokes tobacco   Test / Admission - Considered:  No indication at  this time for admission.  Pain is reproducible with movement.  No significant acute findings on CT imaging or chest x-ray.  Lab work grossly unremarkable except for elevated D-dimer of unclear significance.  Feel the patient may have muscle strain due to acute cough.  Plan to discharge home on Tessalon.  Prior to discharge patient asked for prescription for antibiotics due to hidradenitis suppurativa.  Prescribe short course of doxycycline for same.  Will give patient follow-up information for hidradenitis clinic.  Return precautions provided.         Final Clinical Impression(s) / ED Diagnoses Final diagnoses:  Hidradenitis suppurativa  Chest wall pain    Rx / DC Orders ED Discharge Orders          Ordered    benzonatate (TESSALON) 100 MG capsule  Every 8 hours        05/02/23 0407    doxycycline (VIBRAMYCIN) 100 MG capsule  2 times daily        05/02/23 0408              Darrick Grinder, PA-C 05/02/23 0445    Glynn Octave, MD 05/02/23 0930

## 2023-10-31 ENCOUNTER — Ambulatory Visit: Payer: Self-pay

## 2023-10-31 NOTE — Telephone Encounter (Addendum)
 Haley [name], since this patient is establishing care, and pain began 1 month ago, and the recommendation was to see PCP in 2 weeks, should we schedule her with someone taking new patients?

## 2023-10-31 NOTE — Telephone Encounter (Signed)
 FYI Only or Action Required?: FYI only for provider  Patient was last seen in primary care on 09/11/2021 by Reford Canterbury, MD. Called Nurse Triage reporting Back Pain. Symptoms began about a month ago. Interventions attempted: OTC medications: aleve . Symptoms are: unchanged.  Triage Disposition: See PCP Within 2 Weeks  Patient/caregiver understands and will follow disposition?: Yes       Copied from CRM [id number]. Topic: Clinical - Red Word Triage >> Oct 31, 2023 12:01 PM Abigail D wrote: Red Word that prompted transfer to Nurse Triage: New patient establishing care but voiced concern for back pain that is a lvl 8/10 pain. She stated that it keeps her up at night and she constantly has to take pain meds. Reason for Disposition  Back pain is a chronic symptom (recurrent or ongoing AND present > 4 weeks)  Answer Assessment - Initial Assessment Questions 1. ONSET: "When did the pain begin?"      3-4 week ago 2. LOCATION: "Where does it hurt?" (upper, mid or lower back)     Lower back pain 3. SEVERITY: "How bad is the pain?"  (e.g., Scale 1-10; mild, moderate, or severe)   - MILD (1-3): Doesn't interfere with normal activities.    - MODERATE (4-7): Interferes with normal activities or awakens from sleep.    - SEVERE (8-10): Excruciating pain, unable to do any normal activities.      8/10 4. PATTERN: "Is the pain constant?" (e.g., yes, no; constant, intermittent)      intermittent 5. RADIATION: "Does the pain shoot into your legs or somewhere else?"     no 6. CAUSE:  "What do you think is causing the back pain?"      no 7. BACK OVERUSE:  "Any recent lifting of heavy objects, strenuous work or exercise?"     no 8. MEDICINES: "What have you taken so far for the pain?" (e.g., nothing, acetaminophen , NSAIDS)     Aleve  and azo 9. NEUROLOGIC SYMPTOMS: "Do you have any weakness, numbness, or problems with bowel/bladder control?"     no 10. OTHER SYMPTOMS: "Do you have any other symptoms?"  (e.g., fever, abdomen pain, burning with urination, blood in urine)       no  Protocols used: Back Pain-A-AH

## 2023-11-01 ENCOUNTER — Ambulatory Visit: Admitting: Primary Care

## 2023-11-01 ENCOUNTER — Ambulatory Visit
Admission: RE | Admit: 2023-11-01 | Discharge: 2023-11-01 | Disposition: A | Discharge status: Home / Self Care | Source: Ambulatory Visit | Attending: Primary Care | Admitting: Primary Care

## 2023-11-01 ENCOUNTER — Encounter: Payer: Self-pay | Admitting: Primary Care

## 2023-11-01 VITALS — BP 122/80 | HR 101 | Temp 97.1°F | Ht 65.0 in | Wt 224.0 lb

## 2023-11-01 DIAGNOSIS — M545 Low back pain, unspecified: Secondary | ICD-10-CM

## 2023-11-01 DIAGNOSIS — M549 Dorsalgia, unspecified: Secondary | ICD-10-CM | POA: Diagnosis not present

## 2023-11-01 DIAGNOSIS — M2578 Osteophyte, vertebrae: Secondary | ICD-10-CM | POA: Diagnosis not present

## 2023-11-01 MED ORDER — CYCLOBENZAPRINE HCL 5 MG PO TABS: 5.0000 mg | ORAL_TABLET | Freq: Three times a day (TID) | ORAL | 0 refills | Status: AC | PRN

## 2023-11-01 NOTE — Progress Notes (Signed)
 Subjective:    Patient ID: Haley [name], female    DOB: January 24, 1986, 38 y.o.   MRN: [id number]  Back Pain Pertinent negatives include no numbness or weakness.    Haley [name] is a very pleasant 38 y.o. female patient of Dr. Aletha Hutching with a history of type 2 diabetes, iron deficiency anemia, tobacco use, GAD who presents today to discuss back pain.  Her back pain is located to the mid upper lumbar back which began about 1 month ago. She describes her pain as a soreness/heaviness. Her pain is constant.   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   She denies radiation of pain, numbness/tingling to the lower extremities, urinary symptoms, injury/trauma.  She is taking Ibuprofen  or Aleve  twice daily everyday with temporary improvement. She did try to take AZO thinking that it was a UTI without improvement.    Review of Systems  Musculoskeletal:  Positive for back pain.  Neurological:  Negative for weakness and numbness.         Past Medical History:  Diagnosis Date   Diabetes mellitus    Hidradenitis suppurativa    Psychosis (HCC) 12/16/2018    Social History   Socioeconomic History   Marital status: Single    Spouse name: Not on file   Number of children: Not on file   Years of education: some college   Highest education level: Not on file  Occupational History   Not on file  Tobacco Use   Smoking status: Every Day    Types: Cigars   Smokeless tobacco: Never   Tobacco comments:    1-2 cigars per day  Vaping Use   Vaping status: Never Used  Substance and Sexual Activity   Alcohol use: Yes    Comment: wine a few times a month   Drug use: Yes    Types: Marijuana    Comment: occ   Sexual activity: Not Currently    Birth control/protection: None  Other Topics Concern   Not on file  Social History Narrative   11/12/19   From: [place] originally, moved to [place] to be near adoptive parents   Living: with mom   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, clinical      Family: ok relationship with adoptive parents, has adoptive siblings, as well as biological siblings that she is connecting with - good relationships      Enjoys: live music - open mic/karaoke, making clothes      Exercise: not currently - stretches    Diet: not currently following diabetic diet      Safety   Seat belts: Yes    Guns: No   Safe in relationships: Yes    Social Drivers of Corporate investment banker Strain: Not on file  Food Insecurity: Not on file  Transportation Needs: Not on file  Physical Activity: Not on file  Stress: Not on file  Social Connections: Not on file  Intimate Partner Violence: Not on file    Past Surgical History:  Procedure Laterality Date   LAPAROTOMY N/A 02/09/2020   Procedure: INCISION AND DRAINAGE ABDOMINAL WALL ABCESS;  Surgeon: Melvenia Stabs, MD;  Location: [facility];  Service: General;  Laterality: N/A;   ROOT CANAL  05/17/2011    Family History  Problem Relation Age of Onset   Drug abuse Mother    Cancer Father        unknown type   Kidney failure Father  s/p transplant    No Known Allergies  Current Outpatient Medications on File Prior to Visit  Medication Sig Dispense Refill   Accu-Chek Softclix Lancets lancets 1 each by Other route QID. Use as instructed to check blood sugar up to 4 times a day. 200 each 2   blood glucose meter kit and supplies Dispense based on patient and insurance preference. Use up to four times daily as directed. (FOR ICD-10 E10.9, E11.9). 1 each 0   naproxen  sodium (ALEVE ) 220 MG tablet Take 220 mg by mouth daily as needed (pain).     traZODone  (DESYREL ) 50 MG tablet Take 0.5-2 tablets (25-100 mg total) by mouth at bedtime as needed for sleep. 30 tablet 0   albuterol  (VENTOLIN  HFA) 108 (90 Base) MCG/ACT inhaler Inhale 2 puffs into the lungs every 6 (six) hours as needed for wheezing or shortness of breath. (Patient not taking: Reported on 11/01/2023) 8 g 2    benzonatate  (TESSALON ) 100 MG capsule Take 1 capsule (100 mg total) by mouth every 8 (eight) hours. (Patient not taking: Reported on 11/01/2023) 21 capsule 0   clindamycin  (CLINDAGEL ) 1 % gel Apply topically 2 (two) times daily. (Patient not taking: Reported on 11/01/2023) 30 g 0   doxycycline  (VIBRAMYCIN ) 100 MG capsule Take 1 capsule (100 mg total) by mouth 2 (two) times daily. (Patient not taking: Reported on 11/01/2023) 20 capsule 0   escitalopram  (LEXAPRO ) 10 MG tablet Take 1 tablet (10 mg total) by mouth daily. (Patient not taking: Reported on 11/01/2023) 30 tablet 1   fluconazole  (DIFLUCAN ) 150 MG tablet Take 1 tablet (150 mg total) by mouth daily. (Patient not taking: Reported on 11/01/2023) 1 tablet 0   glucose blood test strip Use to test blood sugar up to 4 times a day (Patient not taking: Reported on 11/01/2023) 100 each 3   Insulin  Glargine-Lixisenatide (SOLIQUA ) 100-33 UNT-MCG/ML SOPN Inject 30 Units into the skin daily. (Patient not taking: Reported on 11/01/2023) 15 mL 1   metFORMIN  (GLUCOPHAGE ) 500 MG tablet Take 1 tablet (500 mg total) by mouth 2 (two) times daily with a meal. (Patient not taking: Reported on 11/01/2023) 60 tablet 0   metFORMIN  (GLUCOPHAGE -XR) 500 MG 24 hr tablet Take 4 tablets (2,000 mg total) by mouth daily with breakfast. (Patient not taking: Reported on 11/01/2023) 360 tablet 0   Norgestimate-Ethinyl Estradiol Triphasic (TRI-ESTARYLLA) 0.18/0.215/0.25 MG-35 MCG tablet Take 1 tablet by mouth daily. (Patient not taking: Reported on 11/01/2023)     sulfamethoxazole -trimethoprim  (BACTRIM  DS) 800-160 MG tablet Take 1 tablet by mouth 2 (two) times daily. (Patient not taking: Reported on 11/01/2023) 14 tablet 0   Vitamin D , Ergocalciferol , (DRISDOL ) 1.25 MG (50000 UNIT) CAPS capsule Take 1 capsule (50,000 Units total) by mouth every 7 (seven) days. (Patient not taking: Reported on 11/01/2023) 4 capsule 1   No current facility-administered medications on file prior to visit.    BP 122/80    Pulse (!) 101   Temp (!) 97.1 F (36.2 C) (Temporal)   Ht 5\' 5"  (1.651 m)   Wt 224 lb (101.6 kg)   LMP 10/30/2023   SpO2 99%   BMI 37.28 kg/m  Objective:   Physical Exam Cardiovascular:     Rate and Rhythm: Normal rate and regular rhythm.  Pulmonary:     Effort: Pulmonary effort is normal.     Breath sounds: Normal breath sounds.  Musculoskeletal:     Lumbar back: Tenderness present. Normal range of motion. Negative right straight leg raise test and  negative left straight leg raise test.       Back:  Skin:    General: Skin is warm and dry.  Neurological:     Mental Status: She is alert.           Assessment & Plan:  Acute midline low back pain without sciatica Assessment & Plan: No alarm signs on exam.  X-rays of the lumbar spine ordered and pending. Start cyclobenzaprine 5 mg 3 times daily as needed.  Drowsiness precautions provided.  Discussed to limit use of ibuprofen /Aleve .  Try Tylenol  as needed.  Offered physical therapy for which she currently declines. She will set up with a new PCP for ongoing management.  Orders: -     Cyclobenzaprine HCl; Take 1 tablet (5 mg total) by mouth 3 (three) times daily as needed for muscle spasms.  Dispense: 30 tablet; Refill: 0 -     DG Lumbar Spine Complete        Gabriel John, NP

## 2023-11-01 NOTE — Assessment & Plan Note (Signed)
 No alarm signs on exam.  X-rays of the lumbar spine ordered and pending. Start cyclobenzaprine 5 mg 3 times daily as needed.  Drowsiness precautions provided.  Discussed to limit use of ibuprofen /Aleve .  Try Tylenol  as needed.  Offered physical therapy for which she currently declines. She will set up with a new PCP for ongoing management.

## 2023-11-01 NOTE — Patient Instructions (Signed)
 Complete xray(s) prior to leaving today. I will notify you of your results once received.  May take the cyclobenzaprine muscle relaxer medication up to 3 times daily as needed.  This may cause drowsiness so start by taking it at night.  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  Set up with a new PCP in our office.  It was a pleasure meeting you!

## 2023-11-07 ENCOUNTER — Ambulatory Visit: Payer: Self-pay | Admitting: Primary Care

## 2023-11-12 NOTE — Telephone Encounter (Signed)
 On date of last appointment, 6.6.25, advised the patient that she needed to establish care.  Patient did not make an appointment at that time. On 6.17.25, attempted to contact the patient to set up a transfer of care, unable to leave a vm

## 2023-12-17 DIAGNOSIS — L732 Hidradenitis suppurativa: Secondary | ICD-10-CM | POA: Diagnosis not present

## 2023-12-17 DIAGNOSIS — K047 Periapical abscess without sinus: Secondary | ICD-10-CM | POA: Diagnosis not present

## 2024-01-13 DIAGNOSIS — E1165 Type 2 diabetes mellitus with hyperglycemia: Secondary | ICD-10-CM | POA: Diagnosis not present

## 2024-01-13 DIAGNOSIS — F419 Anxiety disorder, unspecified: Secondary | ICD-10-CM | POA: Diagnosis not present

## 2024-01-13 DIAGNOSIS — R109 Unspecified abdominal pain: Secondary | ICD-10-CM | POA: Diagnosis not present

## 2024-01-13 DIAGNOSIS — G8929 Other chronic pain: Secondary | ICD-10-CM | POA: Diagnosis not present

## 2024-01-13 DIAGNOSIS — R41840 Attention and concentration deficit: Secondary | ICD-10-CM | POA: Diagnosis not present

## 2024-01-13 DIAGNOSIS — L732 Hidradenitis suppurativa: Secondary | ICD-10-CM | POA: Diagnosis not present

## 2024-01-16 ENCOUNTER — Encounter: Admitting: General Practice

## 2024-01-16 ENCOUNTER — Telehealth: Payer: Self-pay

## 2024-01-16 DIAGNOSIS — Z7689 Persons encountering health services in other specified circumstances: Secondary | ICD-10-CM

## 2024-01-16 DIAGNOSIS — E1165 Type 2 diabetes mellitus with hyperglycemia: Secondary | ICD-10-CM

## 2024-01-16 NOTE — Telephone Encounter (Signed)
 Patient discharged from practice due to no show for patient appointment

## 2024-01-16 NOTE — Telephone Encounter (Signed)
-----   Message from Carrol Aurora sent at 01/16/2024 10:39 AM EDT ----- Patient did not show for their appointment today.  Reviewing their history, has had multiple cancellations and no shows. And they have established at [facility].   Please do not reschedule at this practice.  Thank you.

## 2024-01-31 DIAGNOSIS — H5213 Myopia, bilateral: Secondary | ICD-10-CM | POA: Diagnosis not present

## 2024-03-17 ENCOUNTER — Emergency Department
Admission: EM | Admit: 2024-03-17 | Discharge: 2024-03-17 | Disposition: A | Discharge status: Home / Self Care | Attending: Emergency Medicine | Admitting: Emergency Medicine

## 2024-03-17 ENCOUNTER — Other Ambulatory Visit: Payer: Self-pay

## 2024-03-17 ENCOUNTER — Encounter: Payer: Self-pay | Admitting: Emergency Medicine

## 2024-03-17 ENCOUNTER — Emergency Department

## 2024-03-17 DIAGNOSIS — Y92410 Unspecified street and highway as the place of occurrence of the external cause: Secondary | ICD-10-CM | POA: Diagnosis not present

## 2024-03-17 DIAGNOSIS — M542 Cervicalgia: Secondary | ICD-10-CM | POA: Diagnosis not present

## 2024-03-17 DIAGNOSIS — N611 Abscess of the breast and nipple: Secondary | ICD-10-CM | POA: Insufficient documentation

## 2024-03-17 DIAGNOSIS — R519 Headache, unspecified: Secondary | ICD-10-CM | POA: Diagnosis not present

## 2024-03-17 DIAGNOSIS — E119 Type 2 diabetes mellitus without complications: Secondary | ICD-10-CM | POA: Insufficient documentation

## 2024-03-17 DIAGNOSIS — L0291 Cutaneous abscess, unspecified: Secondary | ICD-10-CM

## 2024-03-17 MED ORDER — KETOROLAC TROMETHAMINE 30 MG/ML IJ SOLN
30.0000 mg | Freq: Once | INTRAMUSCULAR | Status: AC
  Administered 2024-03-17: 30 mg via INTRAMUSCULAR
  Filled 2024-03-17: qty 1

## 2024-03-17 MED ORDER — MELOXICAM 15 MG PO TABS
15.0000 mg | ORAL_TABLET | Freq: Every day | ORAL | 0 refills | Status: AC
Start: 2024-03-17 — End: 2024-04-16

## 2024-03-17 MED ORDER — LIDOCAINE HCL (PF) 1 % IJ SOLN
5.0000 mL | Freq: Once | INTRAMUSCULAR | Status: AC
  Administered 2024-03-17: 5 mL
  Filled 2024-03-17: qty 5

## 2024-03-17 MED ORDER — ACETAMINOPHEN 325 MG PO TABS
650.0000 mg | ORAL_TABLET | Freq: Once | ORAL | Status: AC
  Administered 2024-03-17: 650 mg via ORAL
  Filled 2024-03-17: qty 2

## 2024-03-17 MED ORDER — SULFAMETHOXAZOLE-TRIMETHOPRIM 800-160 MG PO TABS: 1.0000 | ORAL_TABLET | Freq: Two times a day (BID) | ORAL | 0 refills | Status: AC

## 2024-03-17 MED ORDER — FLUCONAZOLE 150 MG PO TABS: 150.0000 mg | ORAL_TABLET | Freq: Every day | ORAL | 0 refills | Status: AC

## 2024-03-17 MED ORDER — CYCLOBENZAPRINE HCL 10 MG PO TABS
5.0000 mg | ORAL_TABLET | Freq: Once | ORAL | Status: DC
  Filled 2024-03-17: qty 1

## 2024-03-17 MED ORDER — METHOCARBAMOL 500 MG PO TABS: 500.0000 mg | ORAL_TABLET | Freq: Four times a day (QID) | ORAL | 0 refills | Status: AC

## 2024-03-17 NOTE — ED Notes (Signed)
 No orders at this time per [place], [place]
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
## 2024-03-17 NOTE — ED Provider Triage Note (Signed)
   Note created by accident and cannot delete.   Sheron, [place], PA-C 03/23/24 1254

## 2024-04-07 ENCOUNTER — Other Ambulatory Visit: Payer: Self-pay

## 2024-04-07 ENCOUNTER — Emergency Department
Admission: EM | Admit: 2024-04-07 | Discharge: 2024-04-07 | Disposition: A | Discharge status: Home / Self Care | Attending: Emergency Medicine | Admitting: Emergency Medicine

## 2024-04-07 DIAGNOSIS — K0889 Other specified disorders of teeth and supporting structures: Secondary | ICD-10-CM | POA: Diagnosis not present

## 2024-04-07 DIAGNOSIS — K029 Dental caries, unspecified: Secondary | ICD-10-CM | POA: Insufficient documentation

## 2024-04-07 DIAGNOSIS — E119 Type 2 diabetes mellitus without complications: Secondary | ICD-10-CM | POA: Insufficient documentation

## 2024-04-07 MED ORDER — NAPROXEN 500 MG PO TABS
500.0000 mg | ORAL_TABLET | Freq: Once | ORAL | Status: AC
  Administered 2024-04-07: 500 mg via ORAL
  Filled 2024-04-07: qty 1

## 2024-04-07 MED ORDER — NAPROXEN 500 MG PO TABS: 500.0000 mg | ORAL_TABLET | Freq: Two times a day (BID) | ORAL | 0 refills | Status: AC

## 2024-04-07 MED ORDER — OXYCODONE-ACETAMINOPHEN 5-325 MG PO TABS: 1.0000 | ORAL_TABLET | Freq: Four times a day (QID) | ORAL | 0 refills | Status: AC | PRN

## 2024-04-07 MED ORDER — AMOXICILLIN-POT CLAVULANATE 875-125 MG PO TABS: 1.0000 | ORAL_TABLET | Freq: Two times a day (BID) | ORAL | 0 refills | Status: AC

## 2024-04-07 NOTE — ED Triage Notes (Signed)
 Pt reports left side dental pain that began 1 week ago

## 2024-04-07 NOTE — ED Provider Notes (Signed)
 [facility] Provider Note    Event Date/Time   First MD Initiated Contact with Patient 04/07/24 [phone]     (approximate)   History   Dental Pain   HPI  Haley [name] is a 38 y.o. female with diabetes who comes ED complaining of left lower jaw pain from a decayed broken tooth.  She saw her dentist who is waiting for Medicaid approval to extract the tooth.  No fever or trouble breathing or swallowing.     Physical Exam   Triage Vital Signs: ED Triage Vitals  Encounter Vitals Group     BP 04/07/24 0519 (!) 182/97     Girls Systolic BP Percentile --      Girls Diastolic BP Percentile --      Boys Systolic BP Percentile --      Boys Diastolic BP Percentile --      Pulse Rate 04/07/24 0519 82     Resp 04/07/24 0519 17     Temp 04/07/24 0519 98.6 F (37 C)     Temp src --      SpO2 04/07/24 0519 100 %     Weight 04/07/24 0518 225 lb (102.1 kg)     Height 04/07/24 0518 5' 5 (1.651 m)     Head Circumference --      Peak Flow --      Pain Score 04/07/24 0518 8     Pain Loc --      Pain Education --      Exclude from Growth Chart --     Most recent vital signs: Vitals:   04/07/24 0519  BP: (!) 182/97  Pulse: 82  Resp: 17  Temp: 98.6 F (37 C)  SpO2: 100%    General: Awake, no distress.  CV:  Good peripheral perfusion.  Resp:  Normal effort.  Abd:  No distention.  Other:  Poor dentition.  Tooth 23 with decay of the lingual aspect.  No gingival swelling or discharge.  No tongue elevation or edema.  Face and submental space are normal.  There is cervical lymph adenopathy   ED Results / Procedures / Treatments   Labs (all labs ordered are listed, but only abnormal results are displayed) Labs Reviewed - No data to display   RADIOLOGY    PROCEDURES:  Procedures   MEDICATIONS ORDERED IN ED: Medications  naproxen  (NAPROSYN ) tablet 500 mg (has no administration in time range)     IMPRESSION / MDM / ASSESSMENT AND PLAN / ED COURSE   I reviewed the triage vital signs and the nursing notes.                             Patient presents with dental pain.  She has fractured tooth, lymphadenopathy, diabetes, will start on Augmentin  along with providing pain control pending dentistry follow-up.      FINAL CLINICAL IMPRESSION(S) / ED DIAGNOSES   Final diagnoses:  Pain, dental     Rx / DC Orders   ED Discharge Orders          Ordered    oxyCODONE -acetaminophen  (PERCOCET) 5-325 MG tablet  Every 6 hours PRN        04/07/24 0537    naproxen  (NAPROSYN ) 500 MG tablet  2 times daily with meals        04/07/24 0537    amoxicillin -clavulanate (AUGMENTIN ) 875-125 MG tablet  2 times daily  04/07/24 0537             Note:  This document was prepared using Dragon voice recognition software and may include unintentional dictation errors.   Viviann Pastor, MD 04/07/24 [phone]

## 2024-04-07 NOTE — ED Notes (Signed)
 Pt reports dental pain x 1 week, worse with cold liquids, taking aleve  and using tea tree oil with some relief. Pt states she went to dental office 2 months ago and waiting for Medicaid approval, but hadn't heard back.  Pt reports she has crack in her tooth and has a big hole in it.
